# Patient Record
Sex: Male | Born: 1945 | Race: White | Hispanic: No | Marital: Married | Smoking: Never smoker
Health system: Southern US, Community
[De-identification: ages and names within clinical notes are randomized; demographics above are authoritative.]

## PROBLEM LIST (undated history)

## (undated) DIAGNOSIS — N400 Enlarged prostate without lower urinary tract symptoms: Secondary | ICD-10-CM

## (undated) DIAGNOSIS — I272 Pulmonary hypertension, unspecified: Secondary | ICD-10-CM

## (undated) DIAGNOSIS — N189 Chronic kidney disease, unspecified: Secondary | ICD-10-CM

## (undated) DIAGNOSIS — F32A Depression, unspecified: Secondary | ICD-10-CM

## (undated) DIAGNOSIS — F419 Anxiety disorder, unspecified: Secondary | ICD-10-CM

## (undated) DIAGNOSIS — K589 Irritable bowel syndrome without diarrhea: Secondary | ICD-10-CM

## (undated) DIAGNOSIS — Z87898 Personal history of other specified conditions: Secondary | ICD-10-CM

## (undated) DIAGNOSIS — I10 Essential (primary) hypertension: Secondary | ICD-10-CM

## (undated) DIAGNOSIS — M1990 Unspecified osteoarthritis, unspecified site: Secondary | ICD-10-CM

## (undated) DIAGNOSIS — I519 Heart disease, unspecified: Secondary | ICD-10-CM

## (undated) DIAGNOSIS — I4891 Unspecified atrial fibrillation: Secondary | ICD-10-CM

## (undated) DIAGNOSIS — E785 Hyperlipidemia, unspecified: Secondary | ICD-10-CM

## (undated) HISTORY — PX: PROSTATE BIOPSY: SHX241

## (undated) HISTORY — DX: Essential (primary) hypertension: I10

## (undated) HISTORY — DX: Pulmonary hypertension, unspecified: I27.20

## (undated) HISTORY — DX: Chronic kidney disease, unspecified: N18.9

## (undated) HISTORY — DX: Unspecified atrial fibrillation: I48.91

## (undated) HISTORY — DX: Anxiety disorder, unspecified: F41.9

## (undated) HISTORY — DX: Depression, unspecified: F32.A

## (undated) HISTORY — PX: OTHER SURGICAL HISTORY: SHX169

## (undated) HISTORY — DX: Heart disease, unspecified: I51.9

## (undated) HISTORY — DX: Hyperlipidemia, unspecified: E78.5

## (undated) HISTORY — PX: CHOLECYSTECTOMY: SHX55

## (undated) HISTORY — DX: Personal history of other specified conditions: Z87.898

## (undated) HISTORY — DX: Unspecified osteoarthritis, unspecified site: M19.90

## (undated) HISTORY — DX: Irritable bowel syndrome, unspecified: K58.9

## (undated) HISTORY — DX: Benign prostatic hyperplasia without lower urinary tract symptoms: N40.0

---

## 2005-02-26 ENCOUNTER — Emergency Department: Payer: Self-pay | Admitting: Emergency Medicine

## 2005-02-26 ENCOUNTER — Other Ambulatory Visit: Payer: Self-pay

## 2011-10-30 ENCOUNTER — Ambulatory Visit: Payer: Self-pay | Admitting: Otolaryngology

## 2013-02-11 ENCOUNTER — Ambulatory Visit: Payer: Self-pay

## 2013-02-11 LAB — DOT URINE DIP
GLUCOSE, UR: NEGATIVE mg/dL (ref 0–75)
PROTEIN: NEGATIVE
Specific Gravity: 1.005 (ref 1.003–1.030)

## 2013-07-09 IMAGING — RF DG BARIUM SWALLOW
1 series · 15 of 17 positions shown · non-contrast
Comparison: none

REASON FOR EXAM: dysphagia
COMMENTS:

PROCEDURE:     FL  - FL BARIUM SWALLOW  - October 30, 2011  [DATE]
RESULT:     Comparison: None.
TECHNIQUE: Standard double contrast barium swallow was performed with
monitoring by intermittent fluoroscopy.

[Series 1: run · 11 acquisitions, 15 frames shown]
[im 1/11]
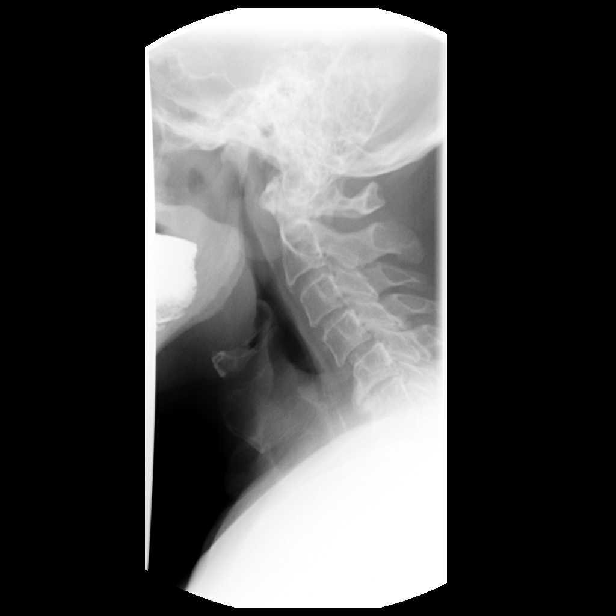
[im 1/11]
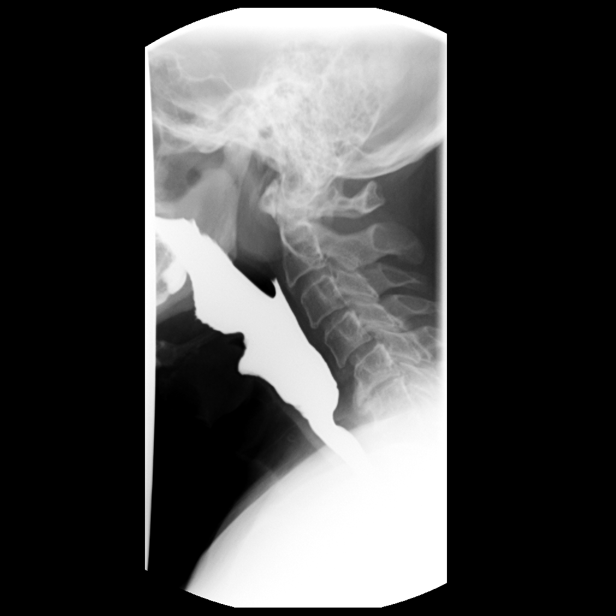
[im 1/11]
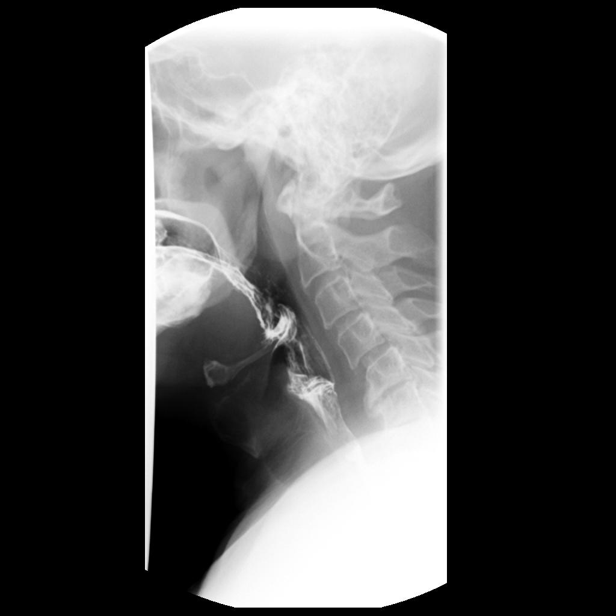
[im 1/11]
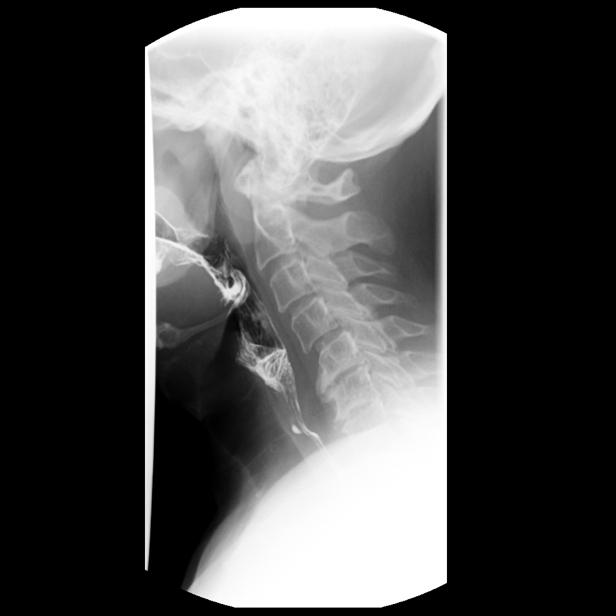
[im 2/11]
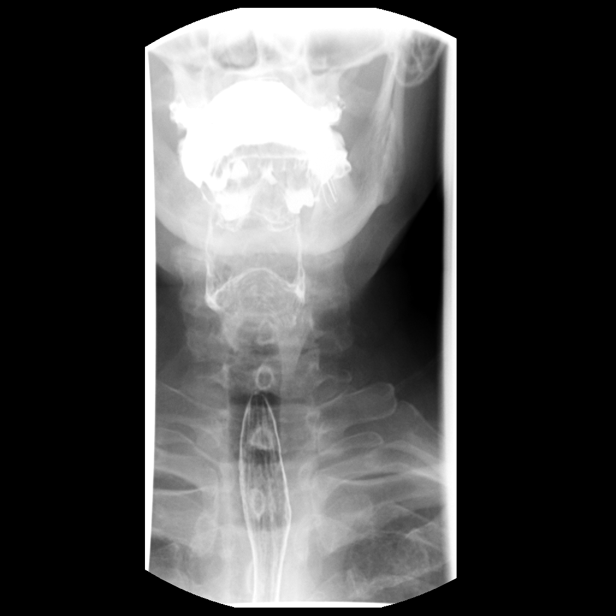
[im 2/11]
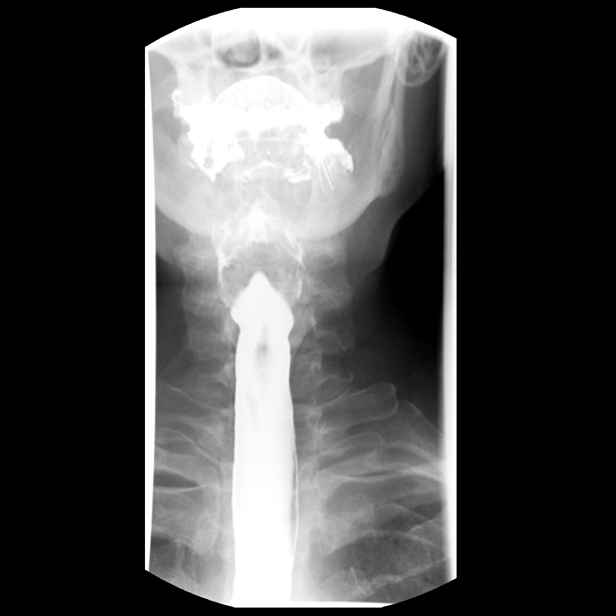
[im 2/11]
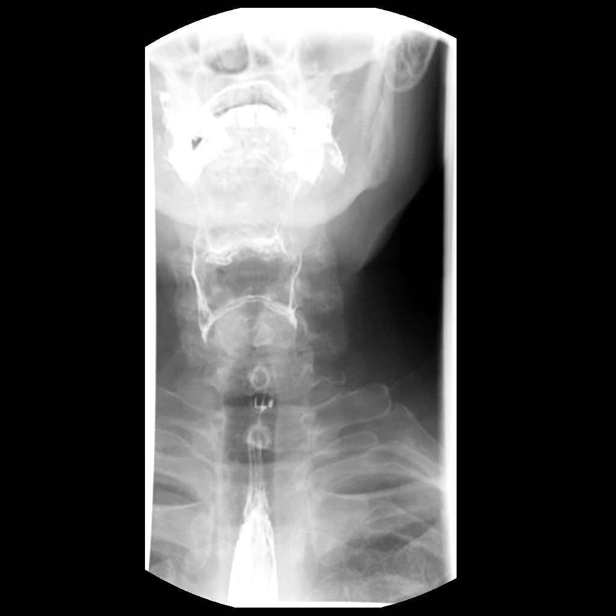
[im 3/11]
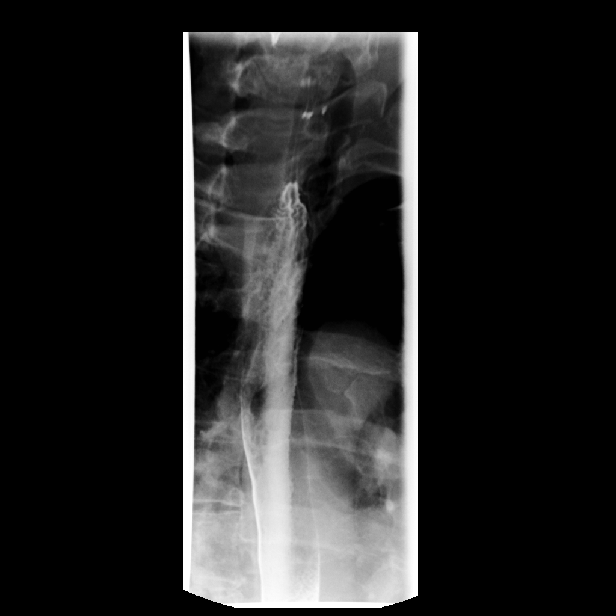
[im 4/11]
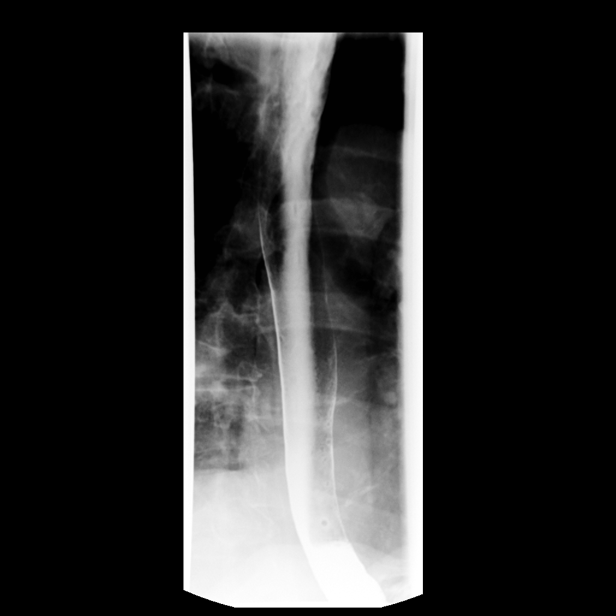
[im 5/11]
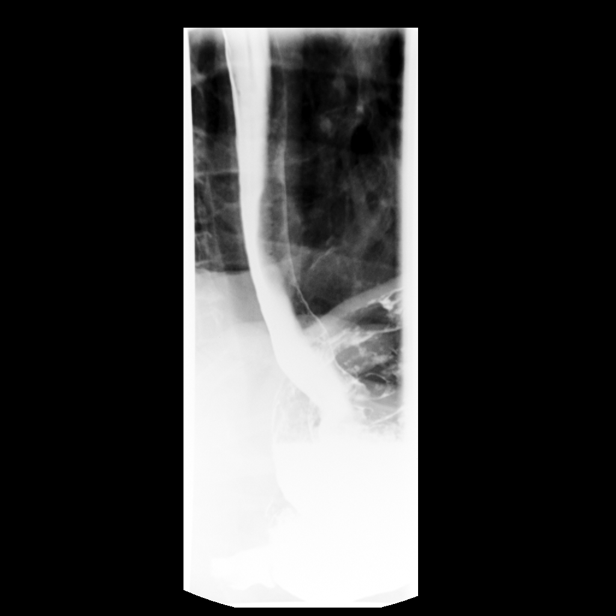
[im 6/11]
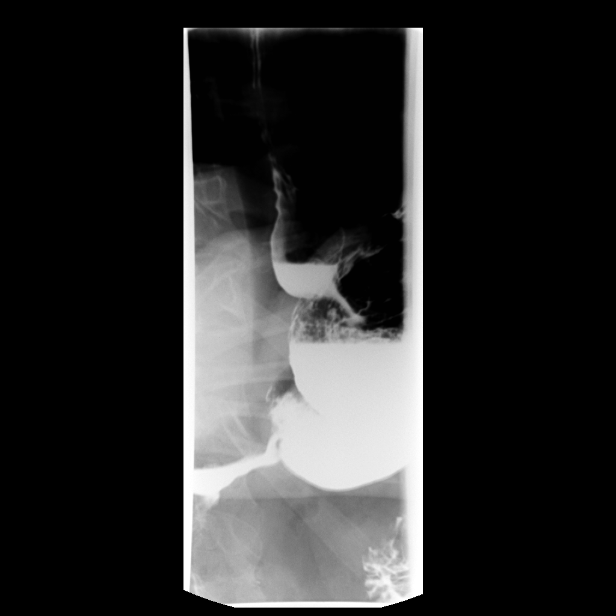
[im 8/11]
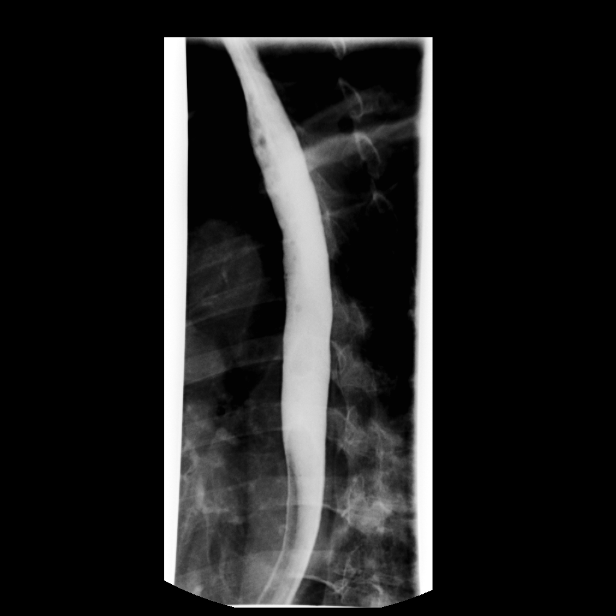
[im 9/11]
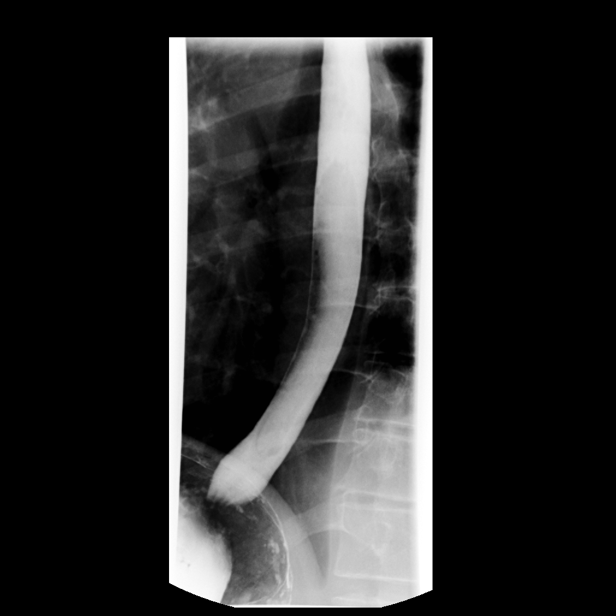
[im 10/11]
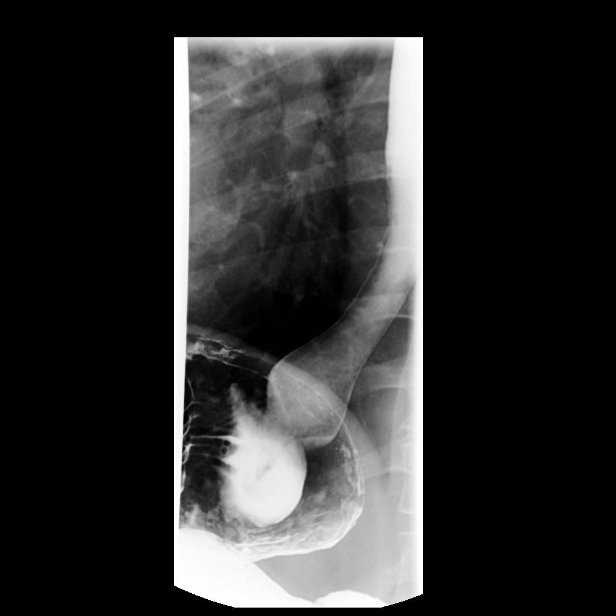
[im 11/11]
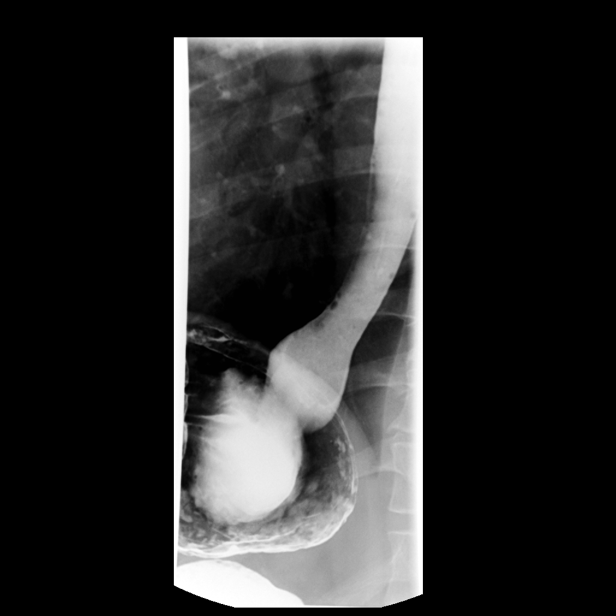

[15 of 17 positions shown; findings below may reference images not displayed]

FINDINGS: There is normal passage of barium contrast material through the of the
pharynx into the cervical esophagus. No penetration or aspiration. The
thoracic esophagus was normal in caliber. No gastroesophageal reflux seen
during the examination. There was a break in the primary peristaltic wave
with a few secondary contractions. No tertiary contractions were seen. The
patient swallowed a 13 mm barium tablet which passed freely into the stomach.

Degenerative disc disease is seen in the lower cervical spine.
IMPRESSION: Minimal esophageal dysmotility. Otherwise, unremarkable barium swallow.

## 2019-06-23 ENCOUNTER — Encounter: Payer: Medicare HMO | Attending: Internal Medicine | Admitting: *Deleted

## 2019-06-23 ENCOUNTER — Other Ambulatory Visit: Payer: Self-pay

## 2019-06-23 DIAGNOSIS — Z952 Presence of prosthetic heart valve: Secondary | ICD-10-CM | POA: Insufficient documentation

## 2019-06-23 DIAGNOSIS — Z9889 Other specified postprocedural states: Secondary | ICD-10-CM

## 2019-06-23 DIAGNOSIS — Z7901 Long term (current) use of anticoagulants: Secondary | ICD-10-CM | POA: Insufficient documentation

## 2019-06-23 DIAGNOSIS — Z79899 Other long term (current) drug therapy: Secondary | ICD-10-CM | POA: Insufficient documentation

## 2019-06-23 NOTE — Progress Notes (Signed)
Initial telephone encounter completed. Diagnosis can be found in [facility] 5/6. EP orientation scheduled for 5/17 1pm

## 2019-06-26 ENCOUNTER — Other Ambulatory Visit: Payer: Self-pay

## 2019-06-26 VITALS — Ht 70.0 in | Wt 188.1 lb

## 2019-06-26 DIAGNOSIS — Z952 Presence of prosthetic heart valve: Secondary | ICD-10-CM

## 2019-06-26 DIAGNOSIS — Z9889 Other specified postprocedural states: Secondary | ICD-10-CM

## 2019-06-26 DIAGNOSIS — Z7901 Long term (current) use of anticoagulants: Secondary | ICD-10-CM | POA: Diagnosis not present

## 2019-06-26 DIAGNOSIS — Z79899 Other long term (current) drug therapy: Secondary | ICD-10-CM | POA: Diagnosis not present

## 2019-06-26 NOTE — Progress Notes (Signed)
Cardiac Individual Treatment Plan  Patient Details  Name: Chase Boyd MRN: [id number] Date of Birth: [birth year] Referring Provider:     Cardiac Rehab from 06/26/2019 in [facility] Cardiac and Pulmonary Rehab  Referring Provider  Wang      Initial Encounter Date:    Cardiac Rehab from 06/26/2019 in [facility] Cardiac and Pulmonary Rehab  Date  06/26/19      Visit Diagnosis: S/P MVR (mitral valve replacement)  S/P TVR (tricuspid valve repair)  Patient's Home Medications on Admission:  Current Outpatient Medications:  .  amiodarone (PACERONE) 200 MG tablet, Take by mouth., Disp: , Rfl:  .  amLODipine (NORVASC) 5 MG tablet, Take by mouth., Disp: , Rfl:  .  apixaban (ELIQUIS) 5 MG TABS tablet, Take by mouth., Disp: , Rfl:  .  buPROPion (WELLBUTRIN XL) 150 MG 24 hr tablet, Take by mouth., Disp: , Rfl:  .  escitalopram (LEXAPRO) 20 MG tablet, Take by mouth., Disp: , Rfl:  .  finasteride (PROSCAR) 5 MG tablet, Take 1 tablet PO daily, Disp: , Rfl:  .  furosemide (LASIX) 20 MG tablet, Take by mouth., Disp: , Rfl:  .  Multiple Vitamin (MULTI-VITAMIN) tablet, Take by mouth., Disp: , Rfl:   Past Medical History: No past medical history on file.  Tobacco Use: Social History   Tobacco Use  Smoking Status Not on file    Labs: Recent Review Flowsheet Data    There is no flowsheet data to display.       Exercise Target Goals: Exercise Program Goal: Individual exercise prescription set using results from initial 6 min walk test and THRR while considering  patient's activity barriers and safety.   Exercise Prescription Goal: Initial exercise prescription builds to 30-45 minutes a day of aerobic activity, 2-3 days per week.  Home exercise guidelines will be given to patient during program as part of exercise prescription that the participant will acknowledge.   Education: Aerobic Exercise & Resistance Training: - Gives group verbal and written instruction on the various components of  exercise. Focuses on aerobic and resistive training programs and the benefits of this training and how to safely progress through these programs..   Education: Exercise & Equipment Safety: - Individual verbal instruction and demonstration of equipment use and safety with use of the equipment.   Cardiac Rehab from 06/26/2019 in [facility] Cardiac and Pulmonary Rehab  Date  06/26/19  Educator  AS  Instruction Review Code  1- Verbalizes Understanding      Education: Exercise Physiology & General Exercise Guidelines: - Group verbal and written instruction with models to review the exercise physiology of the cardiovascular system and associated critical values. Provides general exercise guidelines with specific guidelines to those with heart or lung disease.    Education: Flexibility, Balance, Mind/Body Relaxation: Provides group verbal/written instruction on the benefits of flexibility and balance training, including mind/body exercise modes such as yoga, pilates and tai chi.  Demonstration and skill practice provided.   Activity Barriers & Risk Stratification: Activity Barriers & Cardiac Risk Stratification - 06/23/19 1409      Activity Barriers & Cardiac Risk Stratification   Activity Barriers  Back Problems    Cardiac Risk Stratification  High       6 Minute Walk: 6 Minute Walk    Row Name 06/26/19 1441         6 Minute Walk   Phase  Initial     Distance  995 feet     Walk Time  6  minutes     # of Rest Breaks  0     MPH  1.88     METS  2.25     RPE  11     Perceived Dyspnea   1     VO2 Peak  7.87     Symptoms  No     Resting HR  95 bpm     Resting BP  100/64     Resting Oxygen Saturation   97 %     Exercise Oxygen Saturation  during 6 min walk  95 %     Max Ex. HR  107 bpm     Max Ex. BP  118/66     2 Minute Post BP  112/68        Oxygen Initial Assessment:   Oxygen Re-Evaluation:   Oxygen Discharge (Final Oxygen Re-Evaluation):   Initial Exercise  Prescription: Initial Exercise Prescription - 06/26/19 1500      Date of Initial Exercise RX and Referring Provider   Date  06/26/19    Referring Provider  Mina Marble      Treadmill   MPH  1.5    Grade  0.5    Minutes  15    METs  2.25      Recumbant Bike   Level  1    RPM  60    Minutes  15    METs  2.25      NuStep   Level  2    SPM  80    Minutes  15    METs  2.25      REL-XR   Level  2    Speed  50    Minutes  15    METs  2.25      Prescription Details   Frequency (times per week)  3    Duration  Progress to 30 minutes of continuous aerobic without signs/symptoms of physical distress      Intensity   THRR 40-80% of Max Heartrate  116-137    Ratings of Perceived Exertion  11-13    Perceived Dyspnea  0-4      Resistance Training   Training Prescription  Yes    Weight  3 lb    Reps  10-15       Perform Capillary Blood Glucose checks as needed.  Exercise Prescription Changes: Exercise Prescription Changes    Row Name 06/26/19 1500             Response to Exercise   Blood Pressure (Admit)  110/64       Blood Pressure (Exercise)  118/66       Blood Pressure (Exit)  112/68       Heart Rate (Admit)  95 bpm       Heart Rate (Exercise)  107 bpm       Heart Rate (Exit)  99 bpm       Oxygen Saturation (Admit)  97 %       Oxygen Saturation (Exercise)  95 %       Rating of Perceived Exertion (Exercise)  11       Perceived Dyspnea (Exercise)  1       Symptoms  none          Exercise Comments:   Exercise Goals and Review: Exercise Goals    Row Name 06/26/19 1601             Exercise Goals   Increase Physical  Activity  Yes       Intervention  Provide advice, education, support and counseling about physical activity/exercise needs.;Develop an individualized exercise prescription for aerobic and resistive training based on initial evaluation findings, risk stratification, comorbidities and participant's personal goals.       Expected Outcomes  Short  Term: Attend rehab on a regular basis to increase amount of physical activity.;Long Term: Add in home exercise to make exercise part of routine and to increase amount of physical activity.;Long Term: Exercising regularly at least 3-5 days a week.       Increase Strength and Stamina  Yes       Intervention  Provide advice, education, support and counseling about physical activity/exercise needs.;Develop an individualized exercise prescription for aerobic and resistive training based on initial evaluation findings, risk stratification, comorbidities and participant's personal goals.       Expected Outcomes  Short Term: Increase workloads from initial exercise prescription for resistance, speed, and METs.;Short Term: Perform resistance training exercises routinely during rehab and add in resistance training at home;Long Term: Improve cardiorespiratory fitness, muscular endurance and strength as measured by increased METs and functional capacity (6MWT)       Able to understand and use rate of perceived exertion (RPE) scale  Yes       Intervention  Provide education and explanation on how to use RPE scale       Expected Outcomes  Short Term: Able to use RPE daily in rehab to express subjective intensity level;Long Term:  Able to use RPE to guide intensity level when exercising independently       Able to understand and use Dyspnea scale  Yes       Intervention  Provide education and explanation on how to use Dyspnea scale       Expected Outcomes  Short Term: Able to use Dyspnea scale daily in rehab to express subjective sense of shortness of breath during exertion;Long Term: Able to use Dyspnea scale to guide intensity level when exercising independently       Knowledge and understanding of Target Heart Rate Range (THRR)  Yes       Intervention  Provide education and explanation of THRR including how the numbers were predicted and where they are located for reference       Expected Outcomes  Short Term: Able  to state/look up THRR;Short Term: Able to use daily as guideline for intensity in rehab;Long Term: Able to use THRR to govern intensity when exercising independently       Able to check pulse independently  Yes       Intervention  Provide education and demonstration on how to check pulse in carotid and radial arteries.;Review the importance of being able to check your own pulse for safety during independent exercise       Expected Outcomes  Short Term: Able to explain why pulse checking is important during independent exercise;Long Term: Able to check pulse independently and accurately       Understanding of Exercise Prescription  Yes       Intervention  Provide education, explanation, and written materials on patient's individual exercise prescription       Expected Outcomes  Short Term: Able to explain program exercise prescription;Long Term: Able to explain home exercise prescription to exercise independently          Exercise Goals Re-Evaluation : Exercise Goals Re-Evaluation    [place] Name 06/26/19 1600  Exercise Goal Re-Evaluation   Exercise Goals Review  Increase Physical Activity;Increase Strength and Stamina;Able to understand and use rate of perceived exertion (RPE) scale;Able to understand and use Dyspnea scale;Knowledge and understanding of Target Heart Rate Range (THRR);Able to check pulse independently;Understanding of Exercise Prescription          Discharge Exercise Prescription (Final Exercise Prescription Changes): Exercise Prescription Changes - 06/26/19 1500      Response to Exercise   Blood Pressure (Admit)  110/64    Blood Pressure (Exercise)  118/66    Blood Pressure (Exit)  112/68    Heart Rate (Admit)  95 bpm    Heart Rate (Exercise)  107 bpm    Heart Rate (Exit)  99 bpm    Oxygen Saturation (Admit)  97 %    Oxygen Saturation (Exercise)  95 %    Rating of Perceived Exertion (Exercise)  11    Perceived Dyspnea (Exercise)  1    Symptoms  none        Nutrition:  Target Goals: Understanding of nutrition guidelines, daily intake of sodium <1571m, cholesterol <2064m calories 30% from fat and 7% or less from saturated fats, daily to have 5 or more servings of fruits and vegetables.  Education: Controlling Sodium/Reading Food Labels -Group verbal and written material supporting the discussion of sodium use in heart healthy nutrition. Review and explanation with models, verbal and written materials for utilization of the food label.   Education: General Nutrition Guidelines/Fats and Fiber: -Group instruction provided by verbal, written material, models and posters to present the general guidelines for heart healthy nutrition. Gives an explanation and review of dietary fats and fiber.   Biometrics: Pre Biometrics - 06/26/19 1602      Pre Biometrics   Height  _0  (1.778 m)    Weight  188 lb 1.6 oz (85.3 kg)    BMI (Calculated)  26.99    Single Leg Stand  9.94 seconds        Nutrition Therapy Plan and Nutrition Goals:   Nutrition Assessments: Nutrition Assessments - 06/26/19 1607      MEDFICTS Scores   Pre Score  35       MEDIFICTS Score Key:          ?70 Need to make dietary changes          40-70 Heart Healthy Diet         ? 40 Therapeutic Level Cholesterol Diet  Nutrition Goals Re-Evaluation:   Nutrition Goals Discharge (Final Nutrition Goals Re-Evaluation):   Psychosocial: Target Goals: Acknowledge presence or absence of significant depression and/or stress, maximize coping skills, provide positive support system. Participant is able to verbalize types and ability to use techniques and skills needed for reducing stress and depression.   Education: Depression - Provides group verbal and written instruction on the correlation between heart/lung disease and depressed mood, treatment options, and the stigmas associated with seeking treatment.   Education: Sleep Hygiene -Provides group verbal and written  instruction about how sleep can affect your health.  Define sleep hygiene, discuss sleep cycles and impact of sleep habits. Review good sleep hygiene tips.     Education: Stress and Anxiety: - Provides group verbal and written instruction about the health risks of elevated stress and causes of high stress.  Discuss the correlation between heart/lung disease and anxiety and treatment options. Review healthy ways to manage with stress and anxiety.    Initial Review & Psychosocial Screening: Initial Psych Review & Screening -  06/23/19 1411      Initial Review   Current issues with  History of Depression;Current Psychotropic Meds;Current Depression      Family Dynamics   Good Support System?  Yes      Barriers   Psychosocial barriers to participate in program  There are no identifiable barriers or psychosocial needs.;The patient should benefit from training in stress management and relaxation.      Screening Interventions   Interventions  Encouraged to exercise;To provide support and resources with identified psychosocial needs;Provide feedback about the scores to participant    Expected Outcomes  Short Term goal: Utilizing psychosocial counselor, staff and physician to assist with identification of specific Stressors or current issues interfering with healing process. Setting desired goal for each stressor or current issue identified.;Long Term Goal: Stressors or current issues are controlled or eliminated.;Short Term goal: Identification and review with participant of any Quality of Life or Depression concerns found by scoring the questionnaire.;Long Term goal: The participant improves quality of Life and PHQ9 Scores as seen by post scores and/or verbalization of changes       Quality of Life Scores:  Quality of Life - 06/26/19 1604      Quality of Life   Select  Quality of Life      Quality of Life Scores   Health/Function Pre  17.93 %    Socioeconomic Pre  21.69 %     Psych/Spiritual Pre  24.71 %    Family Pre  27 %    GLOBAL Pre  21.28 %      Scores of 19 and below usually indicate a poorer quality of life in these areas.  A difference of  2-3 points is a clinically meaningful difference.  A difference of 2-3 points in the total score of the Quality of Life Index has been associated with significant improvement in overall quality of life, self-image, physical symptoms, and general health in studies assessing change in quality of life.  PHQ-9: Recent Review Flowsheet Data    Depression screen [facility] 2/9 06/26/2019   Decreased Interest 2   Down, Depressed, Hopeless 1   PHQ - 2 Score 3   Altered sleeping 3   Tired, decreased energy 2   Change in appetite 1   Feeling bad or failure about yourself  0   Trouble concentrating 1   Moving slowly or fidgety/restless 2   Suicidal thoughts 0   PHQ-9 Score 12   Difficult doing work/chores Somewhat difficult     Interpretation of Total Score  Total Score Depression Severity:  1-4 = Minimal depression, 5-9 = Mild depression, 10-14 = Moderate depression, 15-19 = Moderately severe depression, 20-27 = Severe depression   Psychosocial Evaluation and Intervention: Psychosocial Evaluation - 06/23/19 1413      Psychosocial Evaluation & Interventions   Comments  Mr. Standage has been on depression medication for "a while and think its genetic." He states he has been doing well given all the health issues lately. He still is waiting to get his cataract surgery (was supposed to get it but then valve surgery happened). 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 He hopes getting more active will help him feel better    Expected Outcomes  Short: attend [facility] for exercise and education. Long: develop positive self care habits       Psychosocial Re-Evaluation:   Psychosocial Discharge (Final Psychosocial Re-Evaluation):  Vocational Rehabilitation: Provide  vocational rehab assistance to qualifying candidates.   Vocational Rehab Evaluation & Intervention: Vocational Rehab - 06/23/19 1406      Initial Vocational Rehab Evaluation & Intervention   Assessment shows need for Vocational Rehabilitation  No       Education: Education Goals: Education classes will be provided on a variety of topics geared toward better understanding of heart health and risk factor modification. Participant will state understanding/return demonstration of topics presented as noted by education test scores.  Learning Barriers/Preferences: Learning Barriers/Preferences - 06/23/19 1411      Learning Barriers/Preferences   Learning Barriers  None    Learning Preferences  None       General Cardiac Education Topics:  AED/CPR: - Group verbal and written instruction with the use of models to demonstrate the basic use of the AED with the basic ABC's of resuscitation.   Anatomy & Physiology of the Heart: - Group verbal and written instruction and models provide basic cardiac anatomy and physiology, with the coronary electrical and arterial systems. Review of Valvular disease and Heart Failure   Cardiac Procedures: - Group verbal and written instruction to review commonly prescribed medications for heart disease. Reviews the medication, class of the drug, and side effects. Includes the steps to properly store meds and maintain the prescription regimen. (beta blockers and nitrates)   Cardiac Medications I: - Group verbal and written instruction to review commonly prescribed medications for heart disease. Reviews the medication, class of the drug, and side effects. Includes the steps to properly store meds and maintain the prescription regimen.   Cardiac Medications II: -Group verbal and written instruction to review commonly prescribed medications for heart disease. Reviews the medication, class of the drug, and side effects. (all other drug classes)    Go  Sex-Intimacy & Heart Disease, Get SMART - Goal Setting: - Group verbal and written instruction through game format to discuss heart disease and the return to sexual intimacy. Provides group verbal and written material to discuss and apply goal setting through the application of the S.M.A.R.T. Method.   Other Matters of the Heart: - Provides group verbal, written materials and models to describe Stable Angina and Peripheral Artery. Includes description of the disease process and treatment options available to the cardiac patient.   Infection Prevention: - Provides verbal and written material to individual with discussion of infection control including proper hand washing and proper equipment cleaning during exercise session.   Cardiac Rehab from 06/26/2019 in [facility] Cardiac and Pulmonary Rehab  Date  06/26/19  Educator  AS  Instruction Review Code  1- Verbalizes Understanding      Falls Prevention: - Provides verbal and written material to individual with discussion of falls prevention and safety.   Cardiac Rehab from 06/26/2019 in [facility] Cardiac and Pulmonary Rehab  Date  06/26/19  Educator  AS  Instruction Review Code  1- Verbalizes Understanding      Other: -Provides group and verbal instruction on various topics (see comments)   Knowledge Questionnaire Score: Knowledge Questionnaire Score - 06/26/19 1606      Knowledge Questionnaire Score   Pre Score  24/26       Core Components/Risk Factors/Patient Goals at Admission: Personal Goals and Risk Factors at Admission - 06/26/19 1603      Core Components/Risk Factors/Patient Goals on Admission    Weight Management  Yes    Intervention  Weight Management: Develop a combined nutrition and exercise program designed to reach desired caloric intake,  while maintaining appropriate intake of nutrient and fiber, sodium and fats, and appropriate energy expenditure required for the weight goal.;Weight Management: Provide education and  appropriate resources to help participant work on and attain dietary goals.    Admit Weight  188 lb 1.6 oz (85.3 kg)    Goal Weight: Short Term  185 lb (83.9 kg)    Goal Weight: Long Term  180 lb (81.6 kg)    Expected Outcomes  Short Term: Continue to assess and modify interventions until short term weight is achieved;Long Term: Adherence to nutrition and physical activity/exercise program aimed toward attainment of established weight goal    Intervention  Provide education on lifestyle modifcations including regular physical activity/exercise, weight management, moderate sodium restriction and increased consumption of fresh fruit, vegetables, and low fat dairy, alcohol moderation, and smoking cessation.;Monitor prescription use compliance.    Expected Outcomes  Short Term: Continued assessment and intervention until BP is < 140/96m HG in hypertensive participants. < 130/881mHG in hypertensive participants with diabetes, heart failure or chronic kidney disease.;Long Term: Maintenance of blood pressure at goal levels.       Education:Diabetes - Individual verbal and written instruction to review signs/symptoms of diabetes, desired ranges of glucose level fasting, after meals and with exercise. Acknowledge that pre and post exercise glucose checks will be done for 3 sessions at entry of program.   Education: Know Your Numbers and Risk Factors: -Group verbal and written instruction about important numbers in your health.  Discussion of what are risk factors and how they play a role in the disease process.  Review of Cholesterol, Blood Pressure, Diabetes, and BMI and the role they play in your overall health.   Core Components/Risk Factors/Patient Goals Review:    Core Components/Risk Factors/Patient Goals at Discharge (Final Review):    ITP Comments: ITP Comments    Row Name 06/23/19 1404           ITP Comments  Initial telephone encounter completed. Diagnosis can be found in [facility] 5/6.  EP orientation scheduled for 5/17 1pm          Comments: initial ITP

## 2019-06-26 NOTE — Patient Instructions (Signed)
Patient Instructions  Patient Details  Name: Chase Boyd MRN: [id number] Date of Birth: Jul 21, 1945 Referring Provider:  Derinda Sis, MD  Below are your personal goals for exercise, nutrition, and risk factors. Our goal is to help you stay on track towards obtaining and maintaining these goals. We will be discussing your progress on these goals with you throughout the program.  Initial Exercise Prescription: Initial Exercise Prescription - 06/26/19 1500      Date of Initial Exercise RX and Referring Provider   Date  06/26/19    Referring Provider  Mina Marble      Treadmill   MPH  1.5    Grade  0.5    Minutes  15    METs  2.25      Recumbant Bike   Level  1    RPM  60    Minutes  15    METs  2.25      NuStep   Level  2    SPM  80    Minutes  15    METs  2.25      REL-XR   Level  2    Speed  50    Minutes  15    METs  2.25      Prescription Details   Frequency (times per week)  3    Duration  Progress to 30 minutes of continuous aerobic without signs/symptoms of physical distress      Intensity   THRR 40-80% of Max Heartrate  116-137    Ratings of Perceived Exertion  11-13    Perceived Dyspnea  0-4      Resistance Training   Training Prescription  Yes    Weight  3 lb    Reps  10-15       Exercise Goals: Frequency: Be able to perform aerobic exercise two to three times per week in program working toward 2-5 days per week of home exercise.  Intensity: Work with a perceived exertion of 11 (fairly light) - 15 (hard) while following your exercise prescription.  We will make changes to your prescription with you as you progress through the program.   Duration: Be able to do 30 to 45 minutes of continuous aerobic exercise in addition to a 5 minute warm-up and a 5 minute cool-down routine.   Nutrition Goals: Your personal nutrition goals will be established when you do your nutrition analysis with the dietician.  The following are general nutrition guidelines  to follow: Cholesterol < 200mg /day Sodium < 1500mg /day Fiber: Men over 50 yrs - 30 grams per day  Personal Goals: Personal Goals and Risk Factors at Admission - 06/26/19 1603      Core Components/Risk Factors/Patient Goals on Admission    Weight Management  Yes    Intervention  Weight Management: Develop a combined nutrition and exercise program designed to reach desired caloric intake, while maintaining appropriate intake of nutrient and fiber, sodium and fats, and appropriate energy expenditure required for the weight goal.;Weight Management: Provide education and appropriate resources to help participant work on and attain dietary goals.    Admit Weight  188 lb 1.6 oz (85.3 kg)    Goal Weight: Short Term  185 lb (83.9 kg)    Goal Weight: Long Term  180 lb (81.6 kg)    Expected Outcomes  Short Term: Continue to assess and modify interventions until short term weight is achieved;Long Term: Adherence to nutrition and physical activity/exercise program aimed toward attainment of established  weight goal    Intervention  Provide education on lifestyle modifcations including regular physical activity/exercise, weight management, moderate sodium restriction and increased consumption of fresh fruit, vegetables, and low fat dairy, alcohol moderation, and smoking cessation.;Monitor prescription use compliance.    Expected Outcomes  Short Term: Continued assessment and intervention until BP is < 140/65mm HG in hypertensive participants. < 130/5mm HG in hypertensive participants with diabetes, heart failure or chronic kidney disease.;Long Term: Maintenance of blood pressure at goal levels.       Tobacco Use Initial Evaluation: Social History   Tobacco Use  Smoking Status Not on file    Exercise Goals and Review: Exercise Goals    Row Name 06/26/19 1601             Exercise Goals   Increase Physical Activity  Yes       Intervention  Provide advice, education, support and counseling about  physical activity/exercise needs.;Develop an individualized exercise prescription for aerobic and resistive training based on initial evaluation findings, risk stratification, comorbidities and participant's personal goals.       Expected Outcomes  Short Term: Attend rehab on a regular basis to increase amount of physical activity.;Long Term: Add in home exercise to make exercise part of routine and to increase amount of physical activity.;Long Term: Exercising regularly at least 3-5 days a week.       Increase Strength and Stamina  Yes       Intervention  Provide advice, education, support and counseling about physical activity/exercise needs.;Develop an individualized exercise prescription for aerobic and resistive training based on initial evaluation findings, risk stratification, comorbidities and participant's personal goals.       Expected Outcomes  Short Term: Increase workloads from initial exercise prescription for resistance, speed, and METs.;Short Term: Perform resistance training exercises routinely during rehab and add in resistance training at home;Long Term: Improve cardiorespiratory fitness, muscular endurance and strength as measured by increased METs and functional capacity ( )       Able to understand and use rate of perceived exertion (RPE) scale  Yes       Intervention  Provide education and explanation on how to use RPE scale       Expected Outcomes  Short Term: Able to use RPE daily in rehab to express subjective intensity level;Long Term:  Able to use RPE to guide intensity level when exercising independently       Able to understand and use Dyspnea scale  Yes       Intervention  Provide education and explanation on how to use Dyspnea scale       Expected Outcomes  Short Term: Able to use Dyspnea scale daily in rehab to express subjective sense of shortness of breath during exertion;Long Term: Able to use Dyspnea scale to guide intensity level when exercising independently        Knowledge and understanding of Target Heart Rate Range (THRR)  Yes       Intervention  Provide education and explanation of THRR including how the numbers were predicted and where they are located for reference       Expected Outcomes  Short Term: Able to state/look up THRR;Short Term: Able to use daily as guideline for intensity in rehab;Long Term: Able to use THRR to govern intensity when exercising independently       Able to check pulse independently  Yes       Intervention  Provide education and demonstration on how to check pulse in carotid  and radial arteries.;Review the importance of being able to check your own pulse for safety during independent exercise       Expected Outcomes  Short Term: Able to explain why pulse checking is important during independent exercise;Long Term: Able to check pulse independently and accurately       Understanding of Exercise Prescription  Yes       Intervention  Provide education, explanation, and written materials on patient's individual exercise prescription       Expected Outcomes  Short Term: Able to explain program exercise prescription;Long Term: Able to explain home exercise prescription to exercise independently          Copy of goals given to participant.

## 2019-06-28 ENCOUNTER — Encounter: Payer: Self-pay | Admitting: *Deleted

## 2019-06-28 ENCOUNTER — Other Ambulatory Visit: Payer: Self-pay

## 2019-06-28 ENCOUNTER — Encounter: Payer: Medicare HMO | Admitting: *Deleted

## 2019-06-28 DIAGNOSIS — Z952 Presence of prosthetic heart valve: Secondary | ICD-10-CM

## 2019-06-28 DIAGNOSIS — Z9889 Other specified postprocedural states: Secondary | ICD-10-CM

## 2019-06-28 NOTE — Progress Notes (Signed)
Cardiac Individual Treatment Plan  Patient Details  Name: Chase BUA MRN: [id number] Date of Birth: [birth year] Referring Provider:     Cardiac Rehab from 06/26/2019 in [facility] Cardiac and Pulmonary Rehab  Referring Provider  Wang      Initial Encounter Date:    Cardiac Rehab from 06/26/2019 in [facility] Cardiac and Pulmonary Rehab  Date  06/26/19      Visit Diagnosis: S/P MVR (mitral valve replacement)  S/P TVR (tricuspid valve repair)  Patient's Home Medications on Admission:  Current Outpatient Medications:  .  amiodarone (PACERONE) 200 MG tablet, Take by mouth., Disp: , Rfl:  .  amLODipine (NORVASC) 5 MG tablet, Take by mouth., Disp: , Rfl:  .  apixaban (ELIQUIS) 5 MG TABS tablet, Take by mouth., Disp: , Rfl:  .  buPROPion (WELLBUTRIN XL) 150 MG 24 hr tablet, Take by mouth., Disp: , Rfl:  .  escitalopram (LEXAPRO) 20 MG tablet, Take by mouth., Disp: , Rfl:  .  finasteride (PROSCAR) 5 MG tablet, Take 1 tablet PO daily, Disp: , Rfl:  .  furosemide (LASIX) 20 MG tablet, Take by mouth., Disp: , Rfl:  .  Multiple Vitamin (MULTI-VITAMIN) tablet, Take by mouth., Disp: , Rfl:   Past Medical History: No past medical history on file.  Tobacco Use: Social History   Tobacco Use  Smoking Status Not on file    Labs: Recent Review Flowsheet Data    There is no flowsheet data to display.       Exercise Target Goals: Exercise Program Goal: Individual exercise prescription set using results from initial 6 min walk test and THRR while considering  patient's activity barriers and safety.   Exercise Prescription Goal: Initial exercise prescription builds to 30-45 minutes a day of aerobic activity, 2-3 days per week.  Home exercise guidelines will be given to patient during program as part of exercise prescription that the participant will acknowledge.   Education: Aerobic Exercise & Resistance Training: - Gives group verbal and written instruction on the various components of  exercise. Focuses on aerobic and resistive training programs and the benefits of this training and how to safely progress through these programs..   Education: Exercise & Equipment Safety: - Individual verbal instruction and demonstration of equipment use and safety with use of the equipment.   Cardiac Rehab from 06/26/2019 in [facility] Cardiac and Pulmonary Rehab  Date  06/26/19  Educator  AS  Instruction Review Code  1- Verbalizes Understanding      Education: Exercise Physiology & General Exercise Guidelines: - Group verbal and written instruction with models to review the exercise physiology of the cardiovascular system and associated critical values. Provides general exercise guidelines with specific guidelines to those with heart or lung disease.    Education: Flexibility, Balance, Mind/Body Relaxation: Provides group verbal/written instruction on the benefits of flexibility and balance training, including mind/body exercise modes such as yoga, pilates and tai chi.  Demonstration and skill practice provided.   Activity Barriers & Risk Stratification: Activity Barriers & Cardiac Risk Stratification - 06/23/19 1409      Activity Barriers & Cardiac Risk Stratification   Activity Barriers  Back Problems    Cardiac Risk Stratification  High       6 Minute Walk: 6 Minute Walk    Row Name 06/26/19 1441         6 Minute Walk   Phase  Initial     Distance  995 feet     Walk Time  6  minutes     # of Rest Breaks  0     MPH  1.88     METS  2.25     RPE  11     Perceived Dyspnea   1     VO2 Peak  7.87     Symptoms  No     Resting HR  95 bpm     Resting BP  100/64     Resting Oxygen Saturation   97 %     Exercise Oxygen Saturation  during 6 min walk  95 %     Max Ex. HR  107 bpm     Max Ex. BP  118/66     2 Minute Post BP  112/68        Oxygen Initial Assessment:   Oxygen Re-Evaluation:   Oxygen Discharge (Final Oxygen Re-Evaluation):   Initial Exercise  Prescription: Initial Exercise Prescription - 06/26/19 1500      Date of Initial Exercise RX and Referring Provider   Date  06/26/19    Referring Provider  Mina Marble      Treadmill   MPH  1.5    Grade  0.5    Minutes  15    METs  2.25      Recumbant Bike   Level  1    RPM  60    Minutes  15    METs  2.25      NuStep   Level  2    SPM  80    Minutes  15    METs  2.25      REL-XR   Level  2    Speed  50    Minutes  15    METs  2.25      Prescription Details   Frequency (times per week)  3    Duration  Progress to 30 minutes of continuous aerobic without signs/symptoms of physical distress      Intensity   THRR 40-80% of Max Heartrate  116-137    Ratings of Perceived Exertion  11-13    Perceived Dyspnea  0-4      Resistance Training   Training Prescription  Yes    Weight  3 lb    Reps  10-15       Perform Capillary Blood Glucose checks as needed.  Exercise Prescription Changes: Exercise Prescription Changes    Row Name 06/26/19 1500             Response to Exercise   Blood Pressure (Admit)  110/64       Blood Pressure (Exercise)  118/66       Blood Pressure (Exit)  112/68       Heart Rate (Admit)  95 bpm       Heart Rate (Exercise)  107 bpm       Heart Rate (Exit)  99 bpm       Oxygen Saturation (Admit)  97 %       Oxygen Saturation (Exercise)  95 %       Rating of Perceived Exertion (Exercise)  11       Perceived Dyspnea (Exercise)  1       Symptoms  none          Exercise Comments:   Exercise Goals and Review: Exercise Goals    Row Name 06/26/19 1601             Exercise Goals   Increase Physical  Activity  Yes       Intervention  Provide advice, education, support and counseling about physical activity/exercise needs.;Develop an individualized exercise prescription for aerobic and resistive training based on initial evaluation findings, risk stratification, comorbidities and participant's personal goals.       Expected Outcomes  Short  Term: Attend rehab on a regular basis to increase amount of physical activity.;Long Term: Add in home exercise to make exercise part of routine and to increase amount of physical activity.;Long Term: Exercising regularly at least 3-5 days a week.       Increase Strength and Stamina  Yes       Intervention  Provide advice, education, support and counseling about physical activity/exercise needs.;Develop an individualized exercise prescription for aerobic and resistive training based on initial evaluation findings, risk stratification, comorbidities and participant's personal goals.       Expected Outcomes  Short Term: Increase workloads from initial exercise prescription for resistance, speed, and METs.;Short Term: Perform resistance training exercises routinely during rehab and add in resistance training at home;Long Term: Improve cardiorespiratory fitness, muscular endurance and strength as measured by increased METs and functional capacity (6MWT)       Able to understand and use rate of perceived exertion (RPE) scale  Yes       Intervention  Provide education and explanation on how to use RPE scale       Expected Outcomes  Short Term: Able to use RPE daily in rehab to express subjective intensity level;Long Term:  Able to use RPE to guide intensity level when exercising independently       Able to understand and use Dyspnea scale  Yes       Intervention  Provide education and explanation on how to use Dyspnea scale       Expected Outcomes  Short Term: Able to use Dyspnea scale daily in rehab to express subjective sense of shortness of breath during exertion;Long Term: Able to use Dyspnea scale to guide intensity level when exercising independently       Knowledge and understanding of Target Heart Rate Range (THRR)  Yes       Intervention  Provide education and explanation of THRR including how the numbers were predicted and where they are located for reference       Expected Outcomes  Short Term: Able  to state/look up THRR;Short Term: Able to use daily as guideline for intensity in rehab;Long Term: Able to use THRR to govern intensity when exercising independently       Able to check pulse independently  Yes       Intervention  Provide education and demonstration on how to check pulse in carotid and radial arteries.;Review the importance of being able to check your own pulse for safety during independent exercise       Expected Outcomes  Short Term: Able to explain why pulse checking is important during independent exercise;Long Term: Able to check pulse independently and accurately       Understanding of Exercise Prescription  Yes       Intervention  Provide education, explanation, and written materials on patient's individual exercise prescription       Expected Outcomes  Short Term: Able to explain program exercise prescription;Long Term: Able to explain home exercise prescription to exercise independently          Exercise Goals Re-Evaluation : Exercise Goals Re-Evaluation    [place] Name 06/26/19 1600  Exercise Goal Re-Evaluation   Exercise Goals Review  Increase Physical Activity;Increase Strength and Stamina;Able to understand and use rate of perceived exertion (RPE) scale;Able to understand and use Dyspnea scale;Knowledge and understanding of Target Heart Rate Range (THRR);Able to check pulse independently;Understanding of Exercise Prescription          Discharge Exercise Prescription (Final Exercise Prescription Changes): Exercise Prescription Changes - 06/26/19 1500      Response to Exercise   Blood Pressure (Admit)  110/64    Blood Pressure (Exercise)  118/66    Blood Pressure (Exit)  112/68    Heart Rate (Admit)  95 bpm    Heart Rate (Exercise)  107 bpm    Heart Rate (Exit)  99 bpm    Oxygen Saturation (Admit)  97 %    Oxygen Saturation (Exercise)  95 %    Rating of Perceived Exertion (Exercise)  11    Perceived Dyspnea (Exercise)  1    Symptoms  none        Nutrition:  Target Goals: Understanding of nutrition guidelines, daily intake of sodium <1571m, cholesterol <2064m calories 30% from fat and 7% or less from saturated fats, daily to have 5 or more servings of fruits and vegetables.  Education: Controlling Sodium/Reading Food Labels -Group verbal and written material supporting the discussion of sodium use in heart healthy nutrition. Review and explanation with models, verbal and written materials for utilization of the food label.   Education: General Nutrition Guidelines/Fats and Fiber: -Group instruction provided by verbal, written material, models and posters to present the general guidelines for heart healthy nutrition. Gives an explanation and review of dietary fats and fiber.   Biometrics: Pre Biometrics - 06/26/19 1602      Pre Biometrics   Height  _0  (1.778 m)    Weight  188 lb 1.6 oz (85.3 kg)    BMI (Calculated)  26.99    Single Leg Stand  9.94 seconds        Nutrition Therapy Plan and Nutrition Goals:   Nutrition Assessments: Nutrition Assessments - 06/26/19 1607      MEDFICTS Scores   Pre Score  35       MEDIFICTS Score Key:          ?70 Need to make dietary changes          40-70 Heart Healthy Diet         ? 40 Therapeutic Level Cholesterol Diet  Nutrition Goals Re-Evaluation:   Nutrition Goals Discharge (Final Nutrition Goals Re-Evaluation):   Psychosocial: Target Goals: Acknowledge presence or absence of significant depression and/or stress, maximize coping skills, provide positive support system. Participant is able to verbalize types and ability to use techniques and skills needed for reducing stress and depression.   Education: Depression - Provides group verbal and written instruction on the correlation between heart/lung disease and depressed mood, treatment options, and the stigmas associated with seeking treatment.   Education: Sleep Hygiene -Provides group verbal and written  instruction about how sleep can affect your health.  Define sleep hygiene, discuss sleep cycles and impact of sleep habits. Review good sleep hygiene tips.     Education: Stress and Anxiety: - Provides group verbal and written instruction about the health risks of elevated stress and causes of high stress.  Discuss the correlation between heart/lung disease and anxiety and treatment options. Review healthy ways to manage with stress and anxiety.    Initial Review & Psychosocial Screening: Initial Psych Review & Screening -  06/23/19 1411      Initial Review   Current issues with  History of Depression;Current Psychotropic Meds;Current Depression      Family Dynamics   Good Support System?  Yes      Barriers   Psychosocial barriers to participate in program  There are no identifiable barriers or psychosocial needs.;The patient should benefit from training in stress management and relaxation.      Screening Interventions   Interventions  Encouraged to exercise;To provide support and resources with identified psychosocial needs;Provide feedback about the scores to participant    Expected Outcomes  Short Term goal: Utilizing psychosocial counselor, staff and physician to assist with identification of specific Stressors or current issues interfering with healing process. Setting desired goal for each stressor or current issue identified.;Long Term Goal: Stressors or current issues are controlled or eliminated.;Short Term goal: Identification and review with participant of any Quality of Life or Depression concerns found by scoring the questionnaire.;Long Term goal: The participant improves quality of Life and PHQ9 Scores as seen by post scores and/or verbalization of changes       Quality of Life Scores:  Quality of Life - 06/26/19 1604      Quality of Life   Select  Quality of Life      Quality of Life Scores   Health/Function Pre  17.93 %    Socioeconomic Pre  21.69 %     Psych/Spiritual Pre  24.71 %    Family Pre  27 %    GLOBAL Pre  21.28 %      Scores of 19 and below usually indicate a poorer quality of life in these areas.  A difference of  2-3 points is a clinically meaningful difference.  A difference of 2-3 points in the total score of the Quality of Life Index has been associated with significant improvement in overall quality of life, self-image, physical symptoms, and general health in studies assessing change in quality of life.  PHQ-9: Recent Review Flowsheet Data    Depression screen [facility] 2/9 06/26/2019   Decreased Interest 2   Down, Depressed, Hopeless 1   PHQ - 2 Score 3   Altered sleeping 3   Tired, decreased energy 2   Change in appetite 1   Feeling bad or failure about yourself  0   Trouble concentrating 1   Moving slowly or fidgety/restless 2   Suicidal thoughts 0   PHQ-9 Score 12   Difficult doing work/chores Somewhat difficult     Interpretation of Total Score  Total Score Depression Severity:  1-4 = Minimal depression, 5-9 = Mild depression, 10-14 = Moderate depression, 15-19 = Moderately severe depression, 20-27 = Severe depression   Psychosocial Evaluation and Intervention: Psychosocial Evaluation - 06/23/19 1413      Psychosocial Evaluation & Interventions   Comments  Mr. Standage has been on depression medication for "a while and think its genetic." He states he has been doing well given all the health issues lately. He still is waiting to get his cataract surgery (was supposed to get it but then valve surgery happened). 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 He hopes getting more active will help him feel better    Expected Outcomes  Short: attend [facility] for exercise and education. Long: develop positive self care habits       Psychosocial Re-Evaluation:   Psychosocial Discharge (Final Psychosocial Re-Evaluation):  Vocational Rehabilitation: Provide  vocational rehab assistance to qualifying candidates.   Vocational Rehab Evaluation & Intervention: Vocational Rehab - 06/23/19 1406      Initial Vocational Rehab Evaluation & Intervention   Assessment shows need for Vocational Rehabilitation  No       Education: Education Goals: Education classes will be provided on a variety of topics geared toward better understanding of heart health and risk factor modification. Participant will state understanding/return demonstration of topics presented as noted by education test scores.  Learning Barriers/Preferences: Learning Barriers/Preferences - 06/23/19 1411      Learning Barriers/Preferences   Learning Barriers  None    Learning Preferences  None       General Cardiac Education Topics:  AED/CPR: - Group verbal and written instruction with the use of models to demonstrate the basic use of the AED with the basic ABC's of resuscitation.   Anatomy & Physiology of the Heart: - Group verbal and written instruction and models provide basic cardiac anatomy and physiology, with the coronary electrical and arterial systems. Review of Valvular disease and Heart Failure   Cardiac Procedures: - Group verbal and written instruction to review commonly prescribed medications for heart disease. Reviews the medication, class of the drug, and side effects. Includes the steps to properly store meds and maintain the prescription regimen. (beta blockers and nitrates)   Cardiac Medications I: - Group verbal and written instruction to review commonly prescribed medications for heart disease. Reviews the medication, class of the drug, and side effects. Includes the steps to properly store meds and maintain the prescription regimen.   Cardiac Medications II: -Group verbal and written instruction to review commonly prescribed medications for heart disease. Reviews the medication, class of the drug, and side effects. (all other drug classes)    Go  Sex-Intimacy & Heart Disease, Get SMART - Goal Setting: - Group verbal and written instruction through game format to discuss heart disease and the return to sexual intimacy. Provides group verbal and written material to discuss and apply goal setting through the application of the S.M.A.R.T. Method.   Other Matters of the Heart: - Provides group verbal, written materials and models to describe Stable Angina and Peripheral Artery. Includes description of the disease process and treatment options available to the cardiac patient.   Infection Prevention: - Provides verbal and written material to individual with discussion of infection control including proper hand washing and proper equipment cleaning during exercise session.   Cardiac Rehab from 06/26/2019 in [facility] Cardiac and Pulmonary Rehab  Date  06/26/19  Educator  AS  Instruction Review Code  1- Verbalizes Understanding      Falls Prevention: - Provides verbal and written material to individual with discussion of falls prevention and safety.   Cardiac Rehab from 06/26/2019 in [facility] Cardiac and Pulmonary Rehab  Date  06/26/19  Educator  AS  Instruction Review Code  1- Verbalizes Understanding      Other: -Provides group and verbal instruction on various topics (see comments)   Knowledge Questionnaire Score: Knowledge Questionnaire Score - 06/26/19 1606      Knowledge Questionnaire Score   Pre Score  24/26       Core Components/Risk Factors/Patient Goals at Admission: Personal Goals and Risk Factors at Admission - 06/26/19 1603      Core Components/Risk Factors/Patient Goals on Admission    Weight Management  Yes    Intervention  Weight Management: Develop a combined nutrition and exercise program designed to reach desired caloric intake,  while maintaining appropriate intake of nutrient and fiber, sodium and fats, and appropriate energy expenditure required for the weight goal.;Weight Management: Provide education and  appropriate resources to help participant work on and attain dietary goals.    Admit Weight  188 lb 1.6 oz (85.3 kg)    Goal Weight: Short Term  185 lb (83.9 kg)    Goal Weight: Long Term  180 lb (81.6 kg)    Expected Outcomes  Short Term: Continue to assess and modify interventions until short term weight is achieved;Long Term: Adherence to nutrition and physical activity/exercise program aimed toward attainment of established weight goal    Intervention  Provide education on lifestyle modifcations including regular physical activity/exercise, weight management, moderate sodium restriction and increased consumption of fresh fruit, vegetables, and low fat dairy, alcohol moderation, and smoking cessation.;Monitor prescription use compliance.    Expected Outcomes  Short Term: Continued assessment and intervention until BP is < 140/23m HG in hypertensive participants. < 130/864mHG in hypertensive participants with diabetes, heart failure or chronic kidney disease.;Long Term: Maintenance of blood pressure at goal levels.       Education:Diabetes - Individual verbal and written instruction to review signs/symptoms of diabetes, desired ranges of glucose level fasting, after meals and with exercise. Acknowledge that pre and post exercise glucose checks will be done for 3 sessions at entry of program.   Education: Know Your Numbers and Risk Factors: -Group verbal and written instruction about important numbers in your health.  Discussion of what are risk factors and how they play a role in the disease process.  Review of Cholesterol, Blood Pressure, Diabetes, and BMI and the role they play in your overall health.   Core Components/Risk Factors/Patient Goals Review:    Core Components/Risk Factors/Patient Goals at Discharge (Final Review):    ITP Comments: ITP Comments    Row Name 06/23/19 1404 06/26/19 1612 06/28/19 0843       ITP Comments  Initial telephone encounter completed. Diagnosis can  be found in [facility] 5/6. EP orientation scheduled for 5/17 1pm  Completed 6MWT and gym orientation.  Initial ITP created and sent for review to Dr. MaEmily FilbertMedical Director.  30 Day review completed. Medical Director review done, changes made as directed,and approval shown by signature of [employer] researcher       Comments:

## 2019-06-28 NOTE — Progress Notes (Signed)
Daily Session Note  Patient Details  Name: KIP CROPP MRN: [id number] Date of Birth: 1945/06/06 Referring Provider:     Cardiac Rehab from 06/26/2019 in [facility] Cardiac and Pulmonary Rehab  Referring Provider  Mina Marble      Encounter Date: 06/28/2019  Check In: Session Check In - 06/28/19 1508      Check-In   Supervising physician immediately available to respond to emergencies  See telemetry face sheet for immediately available ER MD    Location  [facility]-Cardiac & Pulmonary Rehab    Staff Present  Renita Papa, RN Vickki Hearing, BA, ACSM CEP, Exercise Physiologist;Melissa Caiola RDN, LDN    Virtual Visit  No    Medication changes reported      No    Fall or balance concerns reported     No    Warm-up and Cool-down  Performed on first and last piece of equipment    Resistance Training Performed  Yes    VAD Patient?  No    PAD/SET Patient?  No      Pain Assessment   Currently in Pain?  No/denies          Social History   Tobacco Use  Smoking Status Not on file    Goals Met:  Independence with exercise equipment Exercise tolerated well No report of cardiac concerns or symptoms Strength training completed today  Goals Unmet:  Not Applicable  Comments: First full day of exercise!  Patient was oriented to gym and equipment including functions, settings, policies, and procedures.  Patient's individual exercise prescription and treatment plan were reviewed.  All starting workloads were established based on the results of the 6 minute walk test done at initial orientation visit.  The plan for exercise progression was also introduced and progression will be customized based on patient's performance and goals.    Dr. Emily Filbert is Medical Director for [facility] and [facility].

## 2019-07-05 ENCOUNTER — Encounter: Payer: Medicare HMO | Admitting: *Deleted

## 2019-07-05 ENCOUNTER — Other Ambulatory Visit: Payer: Self-pay

## 2019-07-05 DIAGNOSIS — Z952 Presence of prosthetic heart valve: Secondary | ICD-10-CM

## 2019-07-05 DIAGNOSIS — Z9889 Other specified postprocedural states: Secondary | ICD-10-CM

## 2019-07-05 NOTE — Progress Notes (Signed)
Daily Session Note  Patient Details  Name: Chase Boyd MRN: [id number] Date of Birth: 1945/07/13 Referring Provider:     Cardiac Rehab from 06/26/2019 in [facility] Cardiac and Pulmonary Rehab  Referring Provider  Mina Marble      Encounter Date: 07/05/2019  Check In: Session Check In - 07/05/19 1513      Check-In   Supervising physician immediately available to respond to emergencies  See telemetry face sheet for immediately available ER MD    Location  [facility]-Cardiac & Pulmonary Rehab    Staff Present  Renita Papa, RN BSN;Melissa Caiola RDN, Rowe Pavy, BA, ACSM CEP, Exercise Physiologist    Virtual Visit  No    Medication changes reported      No    Fall or balance concerns reported     No    Warm-up and Cool-down  Performed on first and last piece of equipment    Resistance Training Performed  Yes    VAD Patient?  No    PAD/SET Patient?  No      Pain Assessment   Currently in Pain?  No/denies          Social History   Tobacco Use  Smoking Status Not on file    Goals Met:  Independence with exercise equipment Exercise tolerated well No report of cardiac concerns or symptoms Strength training completed today  Goals Unmet:  Not Applicable  Comments: Pt able to follow exercise prescription today without complaint.  Will continue to monitor for progression.    Dr. Emily Filbert is Medical Director for [place] and [facility].

## 2019-07-06 ENCOUNTER — Encounter: Payer: Medicare HMO | Admitting: *Deleted

## 2019-07-06 ENCOUNTER — Other Ambulatory Visit: Payer: Self-pay

## 2019-07-06 DIAGNOSIS — Z952 Presence of prosthetic heart valve: Secondary | ICD-10-CM

## 2019-07-06 NOTE — Progress Notes (Signed)
Daily Session Note  Patient Details  Name: Chase Boyd MRN: [id number] Date of Birth: [birth year] Referring Provider:     Cardiac Rehab from 06/26/2019 in [facility] Cardiac and Pulmonary Rehab  Referring Provider  Mina Marble      Encounter Date: 07/06/2019  Check In: Session Check In - 07/06/19 1517      Check-In   Supervising physician immediately available to respond to emergencies  See telemetry face sheet for immediately available ER MD    Location  [facility]-Cardiac & Pulmonary Rehab    Staff Present  Renita Papa, RN BSN;Joseph [address] [place], [place], [place], CCRP, CCET    Virtual Visit  No    Medication changes reported      No    Fall or balance concerns reported     No    Warm-up and Cool-down  Performed on first and last piece of equipment    Resistance Training Performed  Yes    VAD Patient?  No    PAD/SET Patient?  No      Pain Assessment   Currently in Pain?  No/denies          Social History   Tobacco Use  Smoking Status Not on file    Goals Met:  Independence with exercise equipment Exercise tolerated well No report of cardiac concerns or symptoms Strength training completed today  Goals Unmet:  Not Applicable  Comments: Pt able to follow exercise prescription today without complaint.  Will continue to monitor for progression.    Dr. Emily Filbert is Medical Director for [place] and [facility].

## 2019-07-12 ENCOUNTER — Other Ambulatory Visit: Payer: Self-pay

## 2019-07-12 ENCOUNTER — Encounter: Payer: Medicare HMO | Attending: Internal Medicine | Admitting: *Deleted

## 2019-07-12 DIAGNOSIS — Z7901 Long term (current) use of anticoagulants: Secondary | ICD-10-CM | POA: Diagnosis not present

## 2019-07-12 DIAGNOSIS — Z79899 Other long term (current) drug therapy: Secondary | ICD-10-CM | POA: Insufficient documentation

## 2019-07-12 DIAGNOSIS — Z952 Presence of prosthetic heart valve: Secondary | ICD-10-CM | POA: Diagnosis present

## 2019-07-12 DIAGNOSIS — Z9889 Other specified postprocedural states: Secondary | ICD-10-CM

## 2019-07-12 NOTE — Progress Notes (Signed)
Daily Session Note  Patient Details  Name: DEONDRA WIGGER MRN: [id number] Date of Birth: December 13, 1945 Referring Provider:     Cardiac Rehab from 06/26/2019 in [facility] Cardiac and Pulmonary Rehab  Referring Provider  Mina Marble      Encounter Date: 07/12/2019  Check In: Session Check In - 07/12/19 1501      Check-In   Supervising physician immediately available to respond to emergencies  See telemetry face sheet for immediately available ER MD    Location  [facility]-Cardiac & Pulmonary Rehab    Staff Present  Renita Papa, RN BSN;Melissa Caiola RDN, Rowe Pavy, BA, ACSM CEP, Exercise Physiologist    Virtual Visit  No    Medication changes reported      No    Fall or balance concerns reported     No    Warm-up and Cool-down  Performed on first and last piece of equipment    Resistance Training Performed  Yes    VAD Patient?  No    PAD/SET Patient?  No      Pain Assessment   Currently in Pain?  No/denies          Social History   Tobacco Use  Smoking Status Not on file    Goals Met:  Independence with exercise equipment Exercise tolerated well No report of cardiac concerns or symptoms Strength training completed today  Goals Unmet:  Not Applicable  Comments: Pt able to follow exercise prescription today without complaint.  Will continue to monitor for progression.    Dr. Emily Filbert is Medical Director for [place] and [facility].

## 2019-07-13 ENCOUNTER — Other Ambulatory Visit: Payer: Self-pay

## 2019-07-13 ENCOUNTER — Encounter: Payer: Medicare HMO | Admitting: *Deleted

## 2019-07-13 DIAGNOSIS — Z9889 Other specified postprocedural states: Secondary | ICD-10-CM

## 2019-07-13 DIAGNOSIS — Z952 Presence of prosthetic heart valve: Secondary | ICD-10-CM

## 2019-07-13 NOTE — Progress Notes (Signed)
Daily Session Note  Patient Details  Name: Chase Boyd MRN: [id number] Date of Birth: 1945-09-07 Referring Provider:     Cardiac Rehab from 06/26/2019 in [facility] Cardiac and Pulmonary Rehab  Referring Provider  Mina Marble      Encounter Date: 07/13/2019  Check In: Session Check In - 07/13/19 1514      Check-In   Supervising physician immediately available to respond to emergencies  See telemetry face sheet for immediately available ER MD    Location  [facility]-Cardiac & Pulmonary Rehab    Staff Present  Renita Papa, RN BSN;Laureen Owens Shark, BS, RRT, CPFT;Amanda Oletta Darter, BA, ACSM CEP, Exercise Physiologist    Virtual Visit  No    Medication changes reported      No    Fall or balance concerns reported     No    Warm-up and Cool-down  Performed on first and last piece of equipment    Resistance Training Performed  Yes    VAD Patient?  No    PAD/SET Patient?  No      Pain Assessment   Currently in Pain?  No/denies          Social History   Tobacco Use  Smoking Status Not on file    Goals Met:  Independence with exercise equipment Exercise tolerated well No report of cardiac concerns or symptoms Strength training completed today  Goals Unmet:  Not Applicable  Comments: Pt able to follow exercise prescription today without complaint.  Will continue to monitor for progression.    Dr. Emily Filbert is Medical Director for [place] and [facility].

## 2019-07-17 ENCOUNTER — Other Ambulatory Visit: Payer: Self-pay

## 2019-07-17 DIAGNOSIS — Z952 Presence of prosthetic heart valve: Secondary | ICD-10-CM

## 2019-07-17 DIAGNOSIS — Z9889 Other specified postprocedural states: Secondary | ICD-10-CM

## 2019-07-17 NOTE — Progress Notes (Signed)
Daily Session Note  Patient Details  Name: Chase Boyd MRN: [id number] Date of Birth: 1945-07-05 Referring Provider:     Cardiac Rehab from 06/26/2019 in [facility] Cardiac and Pulmonary Rehab  Referring Provider  Mina Marble      Encounter Date: 07/17/2019  Check In: Session Check In - 07/17/19 1518      Check-In   Supervising physician immediately available to respond to emergencies  See telemetry face sheet for immediately available ER MD    Location  [facility]-Cardiac & Pulmonary Rehab    Staff Present  Basilia Jumbo, RN, BSN;Joseph Hood RCP,RRT,BSRT;Kara Avella, [place] Exercise Physiologist;Jessica [place], [place], RCEP, CCRP, [place], BS, ACSM CEP, Exercise Physiologist;Melissa [place] RDN, LDN    Virtual Visit  No    Medication changes reported      No    Fall or balance concerns reported     No    Tobacco Cessation  No Change    Warm-up and Cool-down  Performed on first and last piece of equipment    Resistance Training Performed  Yes    VAD Patient?  No    PAD/SET Patient?  No      Pain Assessment   Currently in Pain?  No/denies          Social History   Tobacco Use  Smoking Status Not on file    Goals Met:  Independence with exercise equipment Exercise tolerated well No report of cardiac concerns or symptoms  Goals Unmet:  Not Applicable  Comments: Pt able to follow exercise prescription today without complaint.  Will continue to monitor for progression.    Dr. Emily Filbert is Medical Director for [place] and [facility].

## 2019-07-19 ENCOUNTER — Other Ambulatory Visit: Payer: Self-pay

## 2019-07-19 ENCOUNTER — Encounter: Payer: Medicare HMO | Admitting: *Deleted

## 2019-07-19 DIAGNOSIS — Z952 Presence of prosthetic heart valve: Secondary | ICD-10-CM

## 2019-07-19 DIAGNOSIS — Z9889 Other specified postprocedural states: Secondary | ICD-10-CM

## 2019-07-19 NOTE — Progress Notes (Signed)
Daily Session Note  Patient Details  Name: Chase Boyd MRN: [id number] Date of Birth: 1945/12/11 Referring Provider:     Cardiac Rehab from 06/26/2019 in [facility] Cardiac and Pulmonary Rehab  Referring Provider  Mina Marble      Encounter Date: 07/19/2019  Check In: Session Check In - 07/19/19 1520      Check-In   Supervising physician immediately available to respond to emergencies  See telemetry face sheet for immediately available ER MD    Location  [facility]-Cardiac & Pulmonary Rehab    Staff Present  Heath Lark, RN, BSN, CCRP;Laureen Owens Shark, BS, RRT, CPFT;Melissa Caiola RDN, LDN    Virtual Visit  No    Medication changes reported      No    Fall or balance concerns reported     No    Warm-up and Cool-down  Performed on first and last piece of equipment    Resistance Training Performed  Yes    VAD Patient?  No    PAD/SET Patient?  No      Pain Assessment   Currently in Pain?  No/denies          Social History   Tobacco Use  Smoking Status Not on file    Goals Met:  Independence with exercise equipment Exercise tolerated well No report of cardiac concerns or symptoms  Goals Unmet:  Not Applicable  Comments: Pt able to follow exercise prescription today without complaint.  Will continue to monitor for progression.    Dr. Emily Filbert is Medical Director for [place] and [facility].

## 2019-07-20 ENCOUNTER — Other Ambulatory Visit: Payer: Self-pay

## 2019-07-20 DIAGNOSIS — Z9889 Other specified postprocedural states: Secondary | ICD-10-CM

## 2019-07-20 DIAGNOSIS — Z952 Presence of prosthetic heart valve: Secondary | ICD-10-CM | POA: Diagnosis not present

## 2019-07-20 NOTE — Progress Notes (Signed)
Daily Session Note  Patient Details  Name: Chase Boyd MRN: [id number] Date of Birth: Mar 31, 1945 Referring Provider:     Cardiac Rehab from 06/26/2019 in [facility] Cardiac and Pulmonary Rehab  Referring Provider Mina Marble      Encounter Date: 07/20/2019  Check In:  Session Check In - 07/20/19 1504      Check-In   Supervising physician immediately available to respond to emergencies See telemetry face sheet for immediately available ER MD    Location [facility]-Cardiac & Pulmonary Rehab    Staff Present Vida Rigger RN, BSN;Jessica Luan [place], MA, RCEP, CCRP, Marylynn Pearson, MS Exercise Physiologist    Virtual Visit No    Medication changes reported     No    Fall or balance concerns reported    No    Warm-up and Cool-down Performed on first and last piece of equipment    Resistance Training Performed Yes    VAD Patient? No    PAD/SET Patient? No      Pain Assessment   Currently in Pain? No/denies              Social History   Tobacco Use  Smoking Status Not on file    Goals Met:  Proper associated with RPD/PD & O2 Sat Independence with exercise equipment Exercise tolerated well No report of cardiac concerns or symptoms Strength training completed today  Goals Unmet:  Not Applicable  Comments: Pt able to follow exercise prescription today without complaint.  Will continue to monitor for progression.   Dr. Emily Filbert is Medical Director for [place] and [facility].

## 2019-07-24 ENCOUNTER — Encounter: Payer: Medicare HMO | Admitting: *Deleted

## 2019-07-24 ENCOUNTER — Other Ambulatory Visit: Payer: Self-pay

## 2019-07-24 DIAGNOSIS — Z952 Presence of prosthetic heart valve: Secondary | ICD-10-CM | POA: Diagnosis not present

## 2019-07-24 DIAGNOSIS — Z9889 Other specified postprocedural states: Secondary | ICD-10-CM

## 2019-07-24 NOTE — Progress Notes (Signed)
Daily Session Note  Patient Details  Name: Chase Boyd MRN: [id number] Date of Birth: 10-18-1945 Referring Provider:     Cardiac Rehab from 06/26/2019 in [facility] Cardiac and Pulmonary Rehab  Referring Provider Mina Marble      Encounter Date: 07/24/2019  Check In:  Session Check In - 07/24/19 1501      Check-In   Supervising physician immediately available to respond to emergencies See telemetry face sheet for immediately available ER MD    Location [facility]-Cardiac & Pulmonary Rehab    Staff Present Renita Papa, RN BSN;Joseph [address] [place], [place], [place], CCRP, [place], [place], ACSM CEP, Exercise Physiologist    Virtual Visit No    Medication changes reported     No    Fall or balance concerns reported    No    Warm-up and Cool-down Performed on first and last piece of equipment    Resistance Training Performed Yes    VAD Patient? No    PAD/SET Patient? No      Pain Assessment   Currently in Pain? No/denies              Social History   Tobacco Use  Smoking Status Not on file    Goals Met:  Independence with exercise equipment Exercise tolerated well No report of cardiac concerns or symptoms Strength training completed today  Goals Unmet:  Not Applicable  Comments: Pt able to follow exercise prescription today without complaint.  Will continue to monitor for progression.    Dr. Emily Filbert is Medical Director for [place] and [facility].

## 2019-07-26 ENCOUNTER — Encounter: Payer: Self-pay | Admitting: *Deleted

## 2019-07-26 ENCOUNTER — Other Ambulatory Visit: Payer: Self-pay

## 2019-07-26 ENCOUNTER — Encounter: Payer: Medicare HMO | Admitting: *Deleted

## 2019-07-26 DIAGNOSIS — Z952 Presence of prosthetic heart valve: Secondary | ICD-10-CM

## 2019-07-26 DIAGNOSIS — Z9889 Other specified postprocedural states: Secondary | ICD-10-CM

## 2019-07-26 NOTE — Progress Notes (Signed)
Cardiac Individual Treatment Plan  Patient Details  Name: Chase Boyd MRN: [id number] Date of Birth: 1945-04-08 Referring Provider:     Cardiac Rehab from 06/26/2019 in [facility] Rehab  Referring Provider Wang      Initial Encounter Date:    Cardiac Rehab from 06/26/2019 in [facility] Cardiac and Pulmonary Rehab  Date 06/26/19      Visit Diagnosis: S/P MVR (mitral valve replacement)  S/P TVR (tricuspid valve repair)  Patient's Home Medications on Admission:  Current Outpatient Medications:  .  amiodarone (PACERONE) 200 MG tablet, Take by mouth., Disp: , Rfl:  .  amLODipine (NORVASC) 5 MG tablet, Take by mouth., Disp: , Rfl:  .  apixaban (ELIQUIS) 5 MG TABS tablet, Take by mouth., Disp: , Rfl:  .  buPROPion (WELLBUTRIN XL) 150 MG 24 hr tablet, Take by mouth., Disp: , Rfl:  .  escitalopram (LEXAPRO) 20 MG tablet, Take by mouth., Disp: , Rfl:  .  finasteride (PROSCAR) 5 MG tablet, Take 1 tablet PO daily, Disp: , Rfl:  .  furosemide (LASIX) 20 MG tablet, Take by mouth., Disp: , Rfl:  .  Multiple Vitamin (MULTI-VITAMIN) tablet, Take by mouth., Disp: , Rfl:   Past Medical History: No past medical history on file.  Tobacco Use: Social History   Tobacco Use  Smoking Status Not on file    Labs: Recent Review Flowsheet Data   There is no flowsheet data to display.      Exercise Target Goals: Exercise Program Goal: Individual exercise prescription set using results from initial 6 min walk test and THRR while considering  patient's activity barriers and safety.   Exercise Prescription Goal: Initial exercise prescription builds to 30-45 minutes a day of aerobic activity, 2-3 days per week.  Home exercise guidelines will be given to patient during program as part of exercise prescription that the participant will acknowledge.   Education: Aerobic Exercise & Resistance Training: - Gives group verbal and written instruction on the various components of  exercise. Focuses on aerobic and resistive training programs and the benefits of this training and how to safely progress through these programs..   Education: Exercise & Equipment Safety: - Individual verbal instruction and demonstration of equipment use and safety with use of the equipment.   Cardiac Rehab from 06/26/2019 in [facility] Cardiac and Pulmonary Rehab  Date 06/26/19  Educator AS  Instruction Review Code 1- Verbalizes Understanding      Education: Exercise Physiology & General Exercise Guidelines: - Group verbal and written instruction with models to review the exercise physiology of the cardiovascular system and associated critical values. Provides general exercise guidelines with specific guidelines to those with heart or lung disease.    Education: Flexibility, Balance, Mind/Body Relaxation: Provides group verbal/written instruction on the benefits of flexibility and balance training, including mind/body exercise modes such as yoga, pilates and tai chi.  Demonstration and skill practice provided.   Activity Barriers & Risk Stratification:  Activity Barriers & Cardiac Risk Stratification - 06/23/19 1409      Activity Barriers & Cardiac Risk Stratification   Activity Barriers Back Problems    Cardiac Risk Stratification High           6 Minute Walk:  6 Minute Walk    Row Name 06/26/19 1441         6 Minute Walk   Phase Initial     Distance 995 feet     Walk Time 6 minutes     #  of Rest Breaks 0     MPH 1.88     METS 2.25     RPE 11     Perceived Dyspnea  1     VO2 Peak 7.87     Symptoms No     Resting HR 95 bpm     Resting BP 100/64     Resting Oxygen Saturation  97 %     Exercise Oxygen Saturation  during 6 min walk 95 %     Max Ex. HR 107 bpm     Max Ex. BP 118/66     2 Minute Post BP 112/68            Oxygen Initial Assessment:   Oxygen Re-Evaluation:   Oxygen Discharge (Final Oxygen Re-Evaluation):   Initial Exercise Prescription:   Initial Exercise Prescription - 06/26/19 1500      Date of Initial Exercise RX and Referring Provider   Date 06/26/19    Referring Provider Mina Marble      Treadmill   MPH 1.5    Grade 0.5    Minutes 15    METs 2.25      Recumbant Bike   Level 1    RPM 60    Minutes 15    METs 2.25      NuStep   Level 2    SPM 80    Minutes 15    METs 2.25      REL-XR   Level 2    Speed 50    Minutes 15    METs 2.25      Prescription Details   Frequency (times per week) 3    Duration Progress to 30 minutes of continuous aerobic without signs/symptoms of physical distress      Intensity   THRR 40-80% of Max Heartrate 116-137    Ratings of Perceived Exertion 11-13    Perceived Dyspnea 0-4      Resistance Training   Training Prescription Yes    Weight 3 lb    Reps 10-15           Perform Capillary Blood Glucose checks as needed.  Exercise Prescription Changes:  Exercise Prescription Changes    Row Name 06/26/19 1500 07/06/19 1400 07/19/19 0900         Response to Exercise   Blood Pressure (Admit) 110/64 108/70 118/62     Blood Pressure (Exercise) 118/66 118/56 132/70     Blood Pressure (Exit) 112/68 100/62 90/62     Heart Rate (Admit) 95 bpm 63 bpm 105 bpm     Heart Rate (Exercise) 107 bpm 135 bpm 136 bpm     Heart Rate (Exit) 99 bpm 115 bpm 117 bpm     Oxygen Saturation (Admit) 97 % -- --     Oxygen Saturation (Exercise) 95 % -- --     Rating of Perceived Exertion (Exercise) 11 16 15      Perceived Dyspnea (Exercise) 1 -- --     Symptoms none -- none     Comments -- second day --     Duration -- Continue with 30 min of aerobic exercise without signs/symptoms of physical distress. Continue with 30 min of aerobic exercise without signs/symptoms of physical distress.     Intensity -- THRR unchanged THRR unchanged       Progression   Progression -- Continue to progress workloads to maintain intensity without signs/symptoms of physical distress. Continue to progress  workloads to maintain intensity without signs/symptoms of  physical distress.     Average METs -- 2.5 4.41       Resistance Training   Training Prescription -- Yes Yes     Weight -- 3 lb 3 lb     Reps -- 10-15 10-15       Interval Training   Interval Training -- -- No       Treadmill   MPH -- 1.5 --     Grade -- 0.5 --     Minutes -- 15 --     METs -- 2.25 --       Recumbant Bike   Level -- -- 4     Minutes -- -- 15     METs -- -- 4.22       NuStep   Level -- -- 2     Minutes -- -- 15     METs -- -- 4.6       Elliptical   Level -- 1 --     Speed -- 2.5 --     Minutes -- 15 --     METs -- 2.7 --            Exercise Comments:   Exercise Goals and Review:  Exercise Goals    Row Name 06/26/19 1601             Exercise Goals   Increase Physical Activity Yes       Intervention Provide advice, education, support and counseling about physical activity/exercise needs.;Develop an individualized exercise prescription for aerobic and resistive training based on initial evaluation findings, risk stratification, comorbidities and participant's personal goals.       Expected Outcomes Short Term: Attend rehab on a regular basis to increase amount of physical activity.;Long Term: Add in home exercise to make exercise part of routine and to increase amount of physical activity.;Long Term: Exercising regularly at least 3-5 days a week.       Increase Strength and Stamina Yes       Intervention Provide advice, education, support and counseling about physical activity/exercise needs.;Develop an individualized exercise prescription for aerobic and resistive training based on initial evaluation findings, risk stratification, comorbidities and participant's personal goals.       Expected Outcomes Short Term: Increase workloads from initial exercise prescription for resistance, speed, and METs.;Short Term: Perform resistance training exercises routinely during rehab and add in resistance  training at home;Long Term: Improve cardiorespiratory fitness, muscular endurance and strength as measured by increased METs and functional capacity (6MWT)       Able to understand and use rate of perceived exertion (RPE) scale Yes       Intervention Provide education and explanation on how to use RPE scale       Expected Outcomes Short Term: Able to use RPE daily in rehab to express subjective intensity level;Long Term:  Able to use RPE to guide intensity level when exercising independently       Able to understand and use Dyspnea scale Yes       Intervention Provide education and explanation on how to use Dyspnea scale       Expected Outcomes Short Term: Able to use Dyspnea scale daily in rehab to express subjective sense of shortness of breath during exertion;Long Term: Able to use Dyspnea scale to guide intensity level when exercising independently       Knowledge and understanding of Target Heart Rate Range (THRR) Yes       Intervention Provide education and explanation  of THRR including how the numbers were predicted and where they are located for reference       Expected Outcomes Short Term: Able to state/look up THRR;Short Term: Able to use daily as guideline for intensity in rehab;Long Term: Able to use THRR to govern intensity when exercising independently       Able to check pulse independently Yes       Intervention Provide education and demonstration on how to check pulse in carotid and radial arteries.;Review the importance of being able to check your own pulse for safety during independent exercise       Expected Outcomes Short Term: Able to explain why pulse checking is important during independent exercise;Long Term: Able to check pulse independently and accurately       Understanding of Exercise Prescription Yes       Intervention Provide education, explanation, and written materials on patient's individual exercise prescription       Expected Outcomes Short Term: Able to explain  program exercise prescription;Long Term: Able to explain home exercise prescription to exercise independently              Exercise Goals Re-Evaluation :  Exercise Goals Re-Evaluation    Row Name 06/26/19 1600 06/28/19 1510 07/06/19 1457 07/19/19 0909 07/20/19 1505     Exercise Goal Re-Evaluation   Exercise Goals Review Increase Physical Activity;Increase Strength and Stamina;Able to understand and use rate of perceived exertion (RPE) scale;Able to understand and use Dyspnea scale;Knowledge and understanding of Target Heart Rate Range (THRR);Able to check pulse independently;Understanding of Exercise Prescription Increase Physical Activity;Able to understand and use rate of perceived exertion (RPE) scale;Knowledge and understanding of Target Heart Rate Range (THRR);Understanding of Exercise Prescription;Increase Strength and Stamina;Able to check pulse independently -- Increase Physical Activity;Increase Strength and Stamina;Understanding of Exercise Prescription Increase Strength and Stamina;Increase Physical Activity   Comments -- Reviewed RPE and dyspnea scales, THR and program prescription with pt today.  Pt voiced understanding and was given a copy of goals to take home. Pradeep is doing well in rehab.  He did the whole 15 min on elliptical his second session! Williard is doing well in rehab. He is up to 4.6 METs on the NuStep.  We will continue to monitor his progress. Caydence has a treadmill at home that he uses on his off days from rehab. He is going to increase his workloads at home as he progresses through the program. Kyro has yet to do home exercise with the EP.   Expected Outcomes -- Short: Use RPE daily to regulate intensity. Long: Follow program prescription in THR. Short:  attend consistently Long : improve overall MET level Short: Continue to increase workloads Long: Continue to improve stamina. Short: inform patient about home exercise and increase workloads. Long: increase workloads  for home exercise          Discharge Exercise Prescription (Final Exercise Prescription Changes):  Exercise Prescription Changes - 07/19/19 0900      Response to Exercise   Blood Pressure (Admit) 118/62    Blood Pressure (Exercise) 132/70    Blood Pressure (Exit) 90/62    Heart Rate (Admit) 105 bpm    Heart Rate (Exercise) 136 bpm    Heart Rate (Exit) 117 bpm    Rating of Perceived Exertion (Exercise) 15    Symptoms none    Duration Continue with 30 min of aerobic exercise without signs/symptoms of physical distress.    Intensity THRR unchanged      Progression  Progression Continue to progress workloads to maintain intensity without signs/symptoms of physical distress.    Average METs 4.41      Resistance Training   Training Prescription Yes    Weight 3 lb    Reps 10-15      Interval Training   Interval Training No      Recumbant Bike   Level 4    Minutes 15    METs 4.22      NuStep   Level 2    Minutes 15    METs 4.6           Nutrition:  Target Goals: Understanding of nutrition guidelines, daily intake of sodium <1559m, cholesterol <2068m calories 30% from fat and 7% or less from saturated fats, daily to have 5 or more servings of fruits and vegetables.  Education: Controlling Sodium/Reading Food Labels -Group verbal and written material supporting the discussion of sodium use in heart healthy nutrition. Review and explanation with models, verbal and written materials for utilization of the food label.   Education: General Nutrition Guidelines/Fats and Fiber: -Group instruction provided by verbal, written material, models and posters to present the general guidelines for heart healthy nutrition. Gives an explanation and review of dietary fats and fiber.   Biometrics:  Pre Biometrics - 06/26/19 1602      Pre Biometrics   Height 5' 10"  (1.778 m)    Weight 188 lb 1.6 oz (85.3 kg)    BMI (Calculated) 26.99    Single Leg Stand 9.94 seconds             Nutrition Therapy Plan and Nutrition Goals:  Nutrition Therapy & Goals - 07/24/19 1529      Nutrition Therapy   Diet heart healthy, low Na    Protein (specify units) 70-75g    Fiber 30 grams    Whole Grain Foods 3 servings    Saturated Fats 12 max. grams    Fruits and Vegetables 5 servings/day    Sodium 1.5 grams      Personal Nutrition Goals   Nutrition Goal ST: add salads after dinner LT: strength back so he can work 5/10 now    Comments depends on what he wants B: cereal (cheerios and raisin bran and oat clusters - whole milk) or breakfast bar -doesn't eat much in the morning- in order to take medication with orange juice. D: sandwiches, fast food and pizza- doesn't eat a whole lot of vegetables. salad and grilled chicken at [employer]. Eats a lot of chicken. Pt would like to eat more salads after dinner from going out. Discussed heart healthy eating.      Intervention Plan   Intervention Prescribe, educate and counsel regarding individualized specific dietary modifications aiming towards targeted core components such as weight, hypertension, lipid management, diabetes, heart failure and other comorbidities.;Nutrition handout(s) given to patient.    Expected Outcomes Short Term Goal: Understand basic principles of dietary content, such as calories, fat, sodium, cholesterol and nutrients.;Short Term Goal: A plan has been developed with personal nutrition goals set during dietitian appointment.;Long Term Goal: Adherence to prescribed nutrition plan.           Nutrition Assessments:  Nutrition Assessments - 06/26/19 1607      MEDFICTS Scores   Pre Score 35           MEDIFICTS Score Key:          ?70 Need to make dietary changes  40-70 Heart Healthy Diet         ? 40 Therapeutic Level Cholesterol Diet  Nutrition Goals Re-Evaluation:   Nutrition Goals Discharge (Final Nutrition Goals Re-Evaluation):   Psychosocial: Target Goals: Acknowledge presence  or absence of significant depression and/or stress, maximize coping skills, provide positive support system. Participant is able to verbalize types and ability to use techniques and skills needed for reducing stress and depression.   Education: Depression - Provides group verbal and written instruction on the correlation between heart/lung disease and depressed mood, treatment options, and the stigmas associated with seeking treatment.   Education: Sleep Hygiene -Provides group verbal and written instruction about how sleep can affect your health.  Define sleep hygiene, discuss sleep cycles and impact of sleep habits. Review good sleep hygiene tips.     Education: Stress and Anxiety: - Provides group verbal and written instruction about the health risks of elevated stress and causes of high stress.  Discuss the correlation between heart/lung disease and anxiety and treatment options. Review healthy ways to manage with stress and anxiety.    Initial Review & Psychosocial Screening:  Initial Psych Review & Screening - 06/23/19 1411      Initial Review   Current issues with History of Depression;Current Psychotropic Meds;Current Depression      Family Dynamics   Good Support System? Yes      Barriers   Psychosocial barriers to participate in program There are no identifiable barriers or psychosocial needs.;The patient should benefit from training in stress management and relaxation.      Screening Interventions   Interventions Encouraged to exercise;To provide support and resources with identified psychosocial needs;Provide feedback about the scores to participant    Expected Outcomes Short Term goal: Utilizing psychosocial counselor, staff and physician to assist with identification of specific Stressors or current issues interfering with healing process. Setting desired goal for each stressor or current issue identified.;Long Term Goal: Stressors or current issues are controlled or  eliminated.;Short Term goal: Identification and review with participant of any Quality of Life or Depression concerns found by scoring the questionnaire.;Long Term goal: The participant improves quality of Life and PHQ9 Scores as seen by post scores and/or verbalization of changes           Quality of Life Scores:   Quality of Life - 06/26/19 1604      Quality of Life   Select Quality of Life      Quality of Life Scores   Health/Function Pre 17.93 %    Socioeconomic Pre 21.69 %    Psych/Spiritual Pre 24.71 %    Family Pre 27 %    GLOBAL Pre 21.28 %          Scores of 19 and below usually indicate a poorer quality of life in these areas.  A difference of  2-3 points is a clinically meaningful difference.  A difference of 2-3 points in the total score of the Quality of Life Index has been associated with significant improvement in overall quality of life, self-image, physical symptoms, and general health in studies assessing change in quality of life.  PHQ-9: Recent Review Flowsheet Data    Depression screen [facility] 2/9 07/20/2019 06/26/2019   Decreased Interest 1 2   Down, Depressed, Hopeless 1 1   PHQ - 2 Score 2 3   Altered sleeping 0 3   Tired, decreased energy 1 2   Change in appetite 0 1   Feeling bad or failure about yourself  0 0   Trouble concentrating 0 1   Moving slowly or fidgety/restless 0 2   Suicidal thoughts 0 0   PHQ-9 Score 3 12   Difficult doing work/chores Somewhat difficult Somewhat difficult     Interpretation of Total Score  Total Score Depression Severity:  1-4 = Minimal depression, 5-9 = Mild depression, 10-14 = Moderate depression, 15-19 = Moderately severe depression, 20-27 = Severe depression   Psychosocial Evaluation and Intervention:  Psychosocial Evaluation - 06/23/19 1413      Psychosocial Evaluation & Interventions   Comments Mr. Lewter has been on depression medication for "a while and think its genetic." He states he has been doing well  given all the health issues lately. He still is waiting to get his cataract surgery (was supposed to get it but then valve surgery happened). 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 He hopes getting more active will help him feel better    Expected Outcomes Short: attend [facility] for exercise and education. Long: develop positive self care habits           Psychosocial Re-Evaluation:  Psychosocial Re-Evaluation    [place] Name 07/20/19 1508             Psychosocial Re-Evaluation   Current issues with Current Depression;History of Depression;Current Stress Concerns;Current Psychotropic Meds       Comments Reviewed patient health questionnaire (PHQ-9) with patient for follow up. Previously, patients score indicated signs/symptoms of depression.  Reviewed to see if patient is improving symptom wise while in program.  Score improved and patient states that it is because they his health is improving.       Expected Outcomes Short: Continue to attend [facility] regularly for regular exercise and social engagement. Long: Continue to improve symptoms and manage a positive mental state.       Interventions Encouraged to attend Cardiac Rehabilitation for the exercise       Continue Psychosocial Services  Follow up required by staff              Psychosocial Discharge (Final Psychosocial Re-Evaluation):  Psychosocial Re-Evaluation - 07/20/19 1508      Psychosocial Re-Evaluation   Current issues with Current Depression;History of Depression;Current Stress Concerns;Current Psychotropic Meds    Comments Reviewed patient health questionnaire (PHQ-9) with patient for follow up. Previously, patients score indicated signs/symptoms of depression.  Reviewed to see if patient is improving symptom wise while in program.  Score improved and patient states that it is because they his health is improving.    Expected Outcomes Short: Continue to attend  [facility] regularly for regular exercise and social engagement. Long: Continue to improve symptoms and manage a positive mental state.    Interventions Encouraged to attend Cardiac Rehabilitation for the exercise    Continue Psychosocial Services  Follow up required by staff           Vocational Rehabilitation: Provide vocational rehab assistance to qualifying candidates.   Vocational Rehab Evaluation & Intervention:  Vocational Rehab - 06/23/19 1406      Initial Vocational Rehab Evaluation & Intervention   Assessment shows need for Vocational Rehabilitation No           Education: Education Goals: Education classes will be provided on a variety of topics geared toward better understanding of heart health and risk factor modification. Participant will state understanding/return demonstration of topics presented as noted by education test  scores.  Learning Barriers/Preferences:  Learning Barriers/Preferences - 06/23/19 1411      Learning Barriers/Preferences   Learning Barriers None    Learning Preferences None           General Cardiac Education Topics:  AED/CPR: - Group verbal and written instruction with the use of models to demonstrate the basic use of the AED with the basic ABC's of resuscitation.   Anatomy & Physiology of the Heart: - Group verbal and written instruction and models provide basic cardiac anatomy and physiology, with the coronary electrical and arterial systems. Review of Valvular disease and Heart Failure   Cardiac Procedures: - Group verbal and written instruction to review commonly prescribed medications for heart disease. Reviews the medication, class of the drug, and side effects. Includes the steps to properly store meds and maintain the prescription regimen. (beta blockers and nitrates)   Cardiac Medications I: - Group verbal and written instruction to review commonly prescribed medications for heart disease. Reviews the medication, class  of the drug, and side effects. Includes the steps to properly store meds and maintain the prescription regimen.   Cardiac Medications II: -Group verbal and written instruction to review commonly prescribed medications for heart disease. Reviews the medication, class of the drug, and side effects. (all other drug classes)    Go Sex-Intimacy & Heart Disease, Get SMART - Goal Setting: - Group verbal and written instruction through game format to discuss heart disease and the return to sexual intimacy. Provides group verbal and written material to discuss and apply goal setting through the application of the S.M.A.R.T. Method.   Other Matters of the Heart: - Provides group verbal, written materials and models to describe Stable Angina and Peripheral Artery. Includes description of the disease process and treatment options available to the cardiac patient.   Infection Prevention: - Provides verbal and written material to individual with discussion of infection control including proper hand washing and proper equipment cleaning during exercise session.   Cardiac Rehab from 06/26/2019 in [facility] Cardiac and Pulmonary Rehab  Date 06/26/19  Educator AS  Instruction Review Code 1- Verbalizes Understanding      Falls Prevention: - Provides verbal and written material to individual with discussion of falls prevention and safety.   Cardiac Rehab from 06/26/2019 in [facility] Cardiac and Pulmonary Rehab  Date 06/26/19  Educator AS  Instruction Review Code 1- Verbalizes Understanding      Other: -Provides group and verbal instruction on various topics (see comments)   Knowledge Questionnaire Score:  Knowledge Questionnaire Score - 06/26/19 1606      Knowledge Questionnaire Score   Pre Score 24/26           Core Components/Risk Factors/Patient Goals at Admission:  Personal Goals and Risk Factors at Admission - 06/26/19 1603      Core Components/Risk Factors/Patient Goals on Admission     Weight Management Yes    Intervention Weight Management: Develop a combined nutrition and exercise program designed to reach desired caloric intake, while maintaining appropriate intake of nutrient and fiber, sodium and fats, and appropriate energy expenditure required for the weight goal.;Weight Management: Provide education and appropriate resources to help participant work on and attain dietary goals.    Admit Weight 188 lb 1.6 oz (85.3 kg)    Goal Weight: Short Term 185 lb (83.9 kg)    Goal Weight: Long Term 180 lb (81.6 kg)    Expected Outcomes Short Term: Continue to assess and modify interventions until short term weight  is achieved;Long Term: Adherence to nutrition and physical activity/exercise program aimed toward attainment of established weight goal    Intervention Provide education on lifestyle modifcations including regular physical activity/exercise, weight management, moderate sodium restriction and increased consumption of fresh fruit, vegetables, and low fat dairy, alcohol moderation, and smoking cessation.;Monitor prescription use compliance.    Expected Outcomes Short Term: Continued assessment and intervention until BP is < 140/12m HG in hypertensive participants. < 130/839mHG in hypertensive participants with diabetes, heart failure or chronic kidney disease.;Long Term: Maintenance of blood pressure at goal levels.           Education:Diabetes - Individual verbal and written instruction to review signs/symptoms of diabetes, desired ranges of glucose level fasting, after meals and with exercise. Acknowledge that pre and post exercise glucose checks will be done for 3 sessions at entry of program.   Education: Know Your Numbers and Risk Factors: -Group verbal and written instruction about important numbers in your health.  Discussion of what are risk factors and how they play a role in the disease process.  Review of Cholesterol, Blood Pressure, Diabetes, and BMI and the role  they play in your overall health.   Core Components/Risk Factors/Patient Goals Review:   Goals and Risk Factor Review    Row Name 07/20/19 1513             Core Components/Risk Factors/Patient Goals Review   Personal Goals Review Hypertension;Lipids;Weight Management/Obesity       Review [place] to lose some weight in the program. Spoke to him ablout sodium intake and fluid intake. His blood pressure was on the low side today but does not feel dizzy. He checks his blood pressure at home sometimes. He has been on statins for 20 years and states that his lipids are good.       Expected Outcomes ShortL lose more weight in the program. Long: get weight down to 180 pounds and maintain weight loss.              Core Components/Risk Factors/Patient Goals at Discharge (Final Review):   Goals and Risk Factor Review - 07/20/19 1513      Core Components/Risk Factors/Patient Goals Review   Personal Goals Review Hypertension;Lipids;Weight Management/Obesity    Review [place] to lose some weight in the program. Spoke to him ablout sodium intake and fluid intake. His blood pressure was on the low side today but does not feel dizzy. He checks his blood pressure at home sometimes. He has been on statins for 20 years and states that his lipids are good.    Expected Outcomes ShortL lose more weight in the program. Long: get weight down to 180 pounds and maintain weight loss.           ITP Comments:  ITP Comments    Row Name 06/23/19 1404 06/26/19 1612 06/28/19 0843 06/28/19 1509 07/26/19 0703   ITP Comments Initial telephone encounter completed. Diagnosis can be found in [facility] 5/6. EP orientation scheduled for 5/17 1pm Completed 6MWT and gym orientation.  Initial ITP created and sent for review to Dr. MaEmily FilbertMedical Director. 30 Day review completed. Medical Director review done, changes made as directed,and approval shown by signature of Me[employer] researcherFirst full day of exercise!   Patient was oriented to gym and equipment including functions, settings, policies, and procedures.  Patient's individual exercise prescription and treatment plan were reviewed.  All starting workloads were established based on the results of the 6 minute walk  test done at initial orientation visit.  The plan for exercise progression was also introduced and progression will be customized based on patient's performance and goals. 30 Day review completed. Medical Director ITP review done, changes made as directed, and signed approval by Medical Director.          Comments: 30 Day review completed. Medical Director ITP review done, changes made as directed, and signed approval by Medical Director.

## 2019-07-26 NOTE — Progress Notes (Signed)
Daily Session Note  Patient Details  Name: Chase Boyd MRN: [id number] Date of Birth: Aug 01, 1945 Referring Provider:     Cardiac Rehab from 06/26/2019 in [facility] Cardiac and Pulmonary Rehab  Referring Provider Mina Marble      Encounter Date: 07/26/2019  Check In:  Session Check In - 07/26/19 1517      Check-In   Supervising physician immediately available to respond to emergencies See telemetry face sheet for immediately available ER MD    Location [facility]-Cardiac & Pulmonary Rehab    Staff Present Renita Papa, RN BSN;Melissa Caiola RDN, Rowe Pavy, BA, ACSM CEP, Exercise Physiologist    Virtual Visit No    Medication changes reported     No    Fall or balance concerns reported    No    Warm-up and Cool-down Performed on first and last piece of equipment    Resistance Training Performed Yes    VAD Patient? No    PAD/SET Patient? No      Pain Assessment   Currently in Pain? No/denies              Social History   Tobacco Use  Smoking Status Not on file    Goals Met:  Independence with exercise equipment Exercise tolerated well No report of cardiac concerns or symptoms Strength training completed today  Goals Unmet:  Not Applicable  Comments: Pt able to follow exercise prescription today without complaint.  Will continue to monitor for progression.    Dr. Emily Filbert is Medical Director for [place] and [facility].

## 2019-07-27 ENCOUNTER — Encounter: Payer: Medicare HMO | Admitting: *Deleted

## 2019-07-27 ENCOUNTER — Other Ambulatory Visit: Payer: Self-pay

## 2019-07-27 DIAGNOSIS — Z9889 Other specified postprocedural states: Secondary | ICD-10-CM

## 2019-07-27 DIAGNOSIS — Z952 Presence of prosthetic heart valve: Secondary | ICD-10-CM

## 2019-07-27 NOTE — Progress Notes (Signed)
Daily Session Note  Patient Details  Name: Chase Boyd MRN: [id number] Date of Birth: September 11, 1945 Referring Provider:     Cardiac Rehab from 06/26/2019 in [facility] Cardiac and Pulmonary Rehab  Referring Provider Mina Marble      Encounter Date: 07/27/2019  Check In:  Session Check In - 07/27/19 1504      Check-In   Supervising physician immediately available to respond to emergencies See telemetry face sheet for immediately available ER MD    Location [facility]-Cardiac & Pulmonary Rehab    Staff Present Renita Papa, RN Margurite Auerbach, MS Exercise Physiologist;Joseph Tessie Fass RCP,RRT,BSRT    Virtual Visit No    Medication changes reported     No    Fall or balance concerns reported    No    Warm-up and Cool-down Performed on first and last piece of equipment    Resistance Training Performed Yes    VAD Patient? No    PAD/SET Patient? No      Pain Assessment   Currently in Pain? No/denies              Social History   Tobacco Use  Smoking Status Not on file    Goals Met:  Independence with exercise equipment Exercise tolerated well No report of cardiac concerns or symptoms Strength training completed today  Goals Unmet:  Not Applicable  Comments: Pt able to follow exercise prescription today without complaint.  Will continue to monitor for progression.    Dr. Emily Filbert is Medical Director for [place] and [facility].

## 2019-07-31 ENCOUNTER — Encounter: Payer: Medicare HMO | Admitting: *Deleted

## 2019-07-31 ENCOUNTER — Other Ambulatory Visit: Payer: Self-pay

## 2019-07-31 DIAGNOSIS — Z952 Presence of prosthetic heart valve: Secondary | ICD-10-CM

## 2019-07-31 DIAGNOSIS — Z9889 Other specified postprocedural states: Secondary | ICD-10-CM

## 2019-07-31 NOTE — Progress Notes (Signed)
Daily Session Note  Patient Details  Name: Chase Boyd MRN: [id number] Date of Birth: [birth year] Referring Provider:     Cardiac Rehab from 06/26/2019 in [facility] Cardiac and Pulmonary Rehab  Referring Provider Wang      Encounter Date: 07/31/2019  Check In:  Session Check In - 07/31/19 1514      Check-In   Supervising physician immediately available to respond to emergencies See telemetry face sheet for immediately available ER MD    Location [facility]-Cardiac & Pulmonary Rehab    Staff Present Renita Papa, RN BSN;Joseph [address] [place], [place], ACSM CEP, Exercise Physiologist;Amanda Oletta Darter, [place], ACSM CEP, Exercise Physiologist    Virtual Visit No    Medication changes reported     No    Fall or balance concerns reported    No    Warm-up and Cool-down Performed on first and last piece of equipment    Resistance Training Performed Yes    VAD Patient? No    PAD/SET Patient? No      Pain Assessment   Currently in Pain? No/denies              Social History   Tobacco Use  Smoking Status Not on file    Goals Met:  Independence with exercise equipment Exercise tolerated well No report of cardiac concerns or symptoms Strength training completed today  Goals Unmet:  Not Applicable  Comments: Pt able to follow exercise prescription today without complaint.  Will continue to monitor for progression.    Dr. Emily Filbert is Medical Director for [place] and [facility].

## 2019-08-02 ENCOUNTER — Other Ambulatory Visit: Payer: Self-pay

## 2019-08-02 ENCOUNTER — Encounter: Payer: Medicare HMO | Admitting: *Deleted

## 2019-08-02 DIAGNOSIS — Z952 Presence of prosthetic heart valve: Secondary | ICD-10-CM | POA: Diagnosis not present

## 2019-08-02 DIAGNOSIS — Z9889 Other specified postprocedural states: Secondary | ICD-10-CM

## 2019-08-02 NOTE — Progress Notes (Signed)
Daily Session Note  Patient Details  Name: Chase Boyd MRN: [id number] Date of Birth: 24-Feb-1945 Referring Provider:     Cardiac Rehab from 06/26/2019 in [facility] Cardiac and Pulmonary Rehab  Referring Provider Wang      Encounter Date: 08/02/2019  Check In:  Session Check In - 08/02/19 1457      Check-In   Supervising physician immediately available to respond to emergencies See telemetry face sheet for immediately available ER MD    Location [facility]-Cardiac & Pulmonary Rehab    Staff Present Renita Papa, RN BSN;Melissa Caiola RDN, Rowe Pavy, BA, ACSM CEP, Exercise Physiologist    Virtual Visit No    Medication changes reported     No    Fall or balance concerns reported    No    Warm-up and Cool-down Performed on first and last piece of equipment    Resistance Training Performed Yes    VAD Patient? No    PAD/SET Patient? No      Pain Assessment   Currently in Pain? No/denies              Social History   Tobacco Use  Smoking Status Not on file    Goals Met:  Independence with exercise equipment Exercise tolerated well No report of cardiac concerns or symptoms Strength training completed today  Goals Unmet:  Not Applicable  Comments: Pt able to follow exercise prescription today without complaint.  Will continue to monitor for progression. Reviewed home exercise with pt today.  Pt plans to walk on TM at home for exercise.  Reviewed THR, pulse, RPE, sign and symptoms, pulse oximetery and when to call 911 or MD.  Also discussed weather considerations and indoor options.  Pt voiced understanding.    Dr. Emily Filbert is Medical Director for [place] and [facility].

## 2019-08-03 ENCOUNTER — Other Ambulatory Visit: Payer: Self-pay

## 2019-08-03 ENCOUNTER — Encounter: Payer: Medicare HMO | Admitting: *Deleted

## 2019-08-03 DIAGNOSIS — Z952 Presence of prosthetic heart valve: Secondary | ICD-10-CM

## 2019-08-03 DIAGNOSIS — Z9889 Other specified postprocedural states: Secondary | ICD-10-CM

## 2019-08-03 NOTE — Progress Notes (Signed)
Daily Session Note  Patient Details  Name: Chase Boyd MRN: [id number] Date of Birth: 01-14-1946 Referring Provider:     Cardiac Rehab from 06/26/2019 in [facility] Cardiac and Pulmonary Rehab  Referring Provider Wang      Encounter Date: 08/03/2019  Check In:  Session Check In - 08/03/19 1507      Check-In   Supervising physician immediately available to respond to emergencies See telemetry face sheet for immediately available ER MD    Location [facility]-Cardiac & Pulmonary Rehab    Staff Present Renita Papa, RN BSN;Joseph [address] [place], [place], [place], CCRP, CCET    Virtual Visit No    Medication changes reported     No    Fall or balance concerns reported    No    Warm-up and Cool-down Performed on first and last piece of equipment    Resistance Training Performed Yes    VAD Patient? No    PAD/SET Patient? No      Pain Assessment   Currently in Pain? No/denies              Social History   Tobacco Use  Smoking Status Not on file    Goals Met:  Independence with exercise equipment Exercise tolerated well No report of cardiac concerns or symptoms Strength training completed today  Goals Unmet:  Not Applicable  Comments: Pt able to follow exercise prescription today without complaint.  Will continue to monitor for progression.    Dr. Emily Filbert is Medical Director for [place] and [facility].

## 2019-08-07 ENCOUNTER — Encounter: Payer: Medicare HMO | Admitting: *Deleted

## 2019-08-07 ENCOUNTER — Other Ambulatory Visit: Payer: Self-pay

## 2019-08-07 DIAGNOSIS — Z9889 Other specified postprocedural states: Secondary | ICD-10-CM

## 2019-08-07 DIAGNOSIS — Z952 Presence of prosthetic heart valve: Secondary | ICD-10-CM | POA: Diagnosis not present

## 2019-08-07 NOTE — Progress Notes (Signed)
Daily Session Note  Patient Details  Name: OLLIS DAUDELIN MRN: [id number] Date of Birth: 05/21/1945 Referring Provider:     Cardiac Rehab from 06/26/2019 in [facility] Cardiac and Pulmonary Rehab  Referring Provider Wang      Encounter Date: 08/07/2019  Check In:  Session Check In - 08/07/19 1511      Check-In   Supervising physician immediately available to respond to emergencies See telemetry face sheet for immediately available ER MD    Location [facility]-Cardiac & Pulmonary Rehab    Staff Present Renita Papa, RN BSN;Joseph [address] [place], [place], [place], CCRP, [place], [place], ACSM CEP, Exercise Physiologist    Virtual Visit No    Medication changes reported     No    Fall or balance concerns reported    No    Warm-up and Cool-down Performed on first and last piece of equipment    Resistance Training Performed Yes    VAD Patient? No    PAD/SET Patient? No      Pain Assessment   Currently in Pain? No/denies              Social History   Tobacco Use  Smoking Status Not on file    Goals Met:  Independence with exercise equipment Exercise tolerated well No report of cardiac concerns or symptoms Strength training completed today  Goals Unmet:  Not Applicable  Comments: Pt able to follow exercise prescription today without complaint.  Will continue to monitor for progression.    Dr. Emily Filbert is Medical Director for [place] and [facility].

## 2019-08-09 ENCOUNTER — Other Ambulatory Visit: Payer: Self-pay

## 2019-08-09 ENCOUNTER — Encounter: Payer: Medicare HMO | Admitting: *Deleted

## 2019-08-09 DIAGNOSIS — Z952 Presence of prosthetic heart valve: Secondary | ICD-10-CM

## 2019-08-09 DIAGNOSIS — Z9889 Other specified postprocedural states: Secondary | ICD-10-CM

## 2019-08-09 NOTE — Progress Notes (Signed)
Daily Session Note  Patient Details  Name: Chase Boyd MRN: [id number] Date of Birth: [birth year] Referring Provider:     Cardiac Rehab from 06/26/2019 in [facility] Cardiac and Pulmonary Rehab  Referring Provider Mina Marble      Encounter Date: 08/09/2019  Check In:  Session Check In - 08/09/19 1503      Check-In   Supervising physician immediately available to respond to emergencies See telemetry face sheet for immediately available ER MD    Location [facility]-Cardiac & Pulmonary Rehab    Staff Present Renita Papa, RN BSN;Laureen Owens Shark, BS, RRT, CPFT;Jessica Hilham, MA, RCEP, CCRP, CCET;Amanda Sommer, BA, ACSM CEP, Exercise Physiologist    Virtual Visit No    Medication changes reported     No    Fall or balance concerns reported    No    Warm-up and Cool-down Performed on first and last piece of equipment    Resistance Training Performed Yes    VAD Patient? No    PAD/SET Patient? No      Pain Assessment   Currently in Pain? No/denies              Social History   Tobacco Use  Smoking Status Not on file    Goals Met:  Independence with exercise equipment Exercise tolerated well No report of cardiac concerns or symptoms Strength training completed today  Goals Unmet:  Not Applicable  Comments: Pt able to follow exercise prescription today without complaint.  Will continue to monitor for progression.    Dr. Emily Filbert is Medical Director for [place] and [facility].

## 2019-08-10 ENCOUNTER — Encounter: Payer: Medicare HMO | Attending: Internal Medicine | Admitting: *Deleted

## 2019-08-10 ENCOUNTER — Other Ambulatory Visit: Payer: Self-pay

## 2019-08-10 DIAGNOSIS — Z9889 Other specified postprocedural states: Secondary | ICD-10-CM

## 2019-08-10 DIAGNOSIS — Z79899 Other long term (current) drug therapy: Secondary | ICD-10-CM | POA: Insufficient documentation

## 2019-08-10 DIAGNOSIS — Z952 Presence of prosthetic heart valve: Secondary | ICD-10-CM

## 2019-08-10 DIAGNOSIS — Z7901 Long term (current) use of anticoagulants: Secondary | ICD-10-CM | POA: Diagnosis not present

## 2019-08-10 NOTE — Progress Notes (Signed)
Daily Session Note  Patient Details  Name: SHEAMUS HASTING MRN: [id number] Date of Birth: 15-Oct-1945 Referring Provider:     Cardiac Rehab from 06/26/2019 in [facility] Cardiac and Pulmonary Rehab  Referring Provider Mina Marble      Encounter Date: 08/10/2019  Check In:  Session Check In - 08/10/19 1506      Check-In   Supervising physician immediately available to respond to emergencies See telemetry face sheet for immediately available ER MD    Location [facility]-Cardiac & Pulmonary Rehab    Staff Present Renita Papa, RN BSN;Joseph [address] [place], [place], [place], CCRP, CCET    Virtual Visit No    Medication changes reported     No    Fall or balance concerns reported    No    Warm-up and Cool-down Performed on first and last piece of equipment    Resistance Training Performed Yes    VAD Patient? No    PAD/SET Patient? No      Pain Assessment   Currently in Pain? No/denies              Social History   Tobacco Use  Smoking Status Not on file    Goals Met:  Independence with exercise equipment Exercise tolerated well No report of cardiac concerns or symptoms Strength training completed today  Goals Unmet:  Not Applicable  Comments: Pt able to follow exercise prescription today without complaint.  Will continue to monitor for progression.    Dr. Emily Filbert is Medical Director for [place] and [facility].

## 2019-08-21 ENCOUNTER — Encounter: Payer: Medicare HMO | Admitting: *Deleted

## 2019-08-21 ENCOUNTER — Other Ambulatory Visit: Payer: Self-pay

## 2019-08-21 DIAGNOSIS — Z9889 Other specified postprocedural states: Secondary | ICD-10-CM

## 2019-08-21 DIAGNOSIS — Z952 Presence of prosthetic heart valve: Secondary | ICD-10-CM

## 2019-08-21 NOTE — Progress Notes (Signed)
Daily Session Note  Patient Details  Name: MUAAZ BRAU MRN: [id number] Date of Birth: 1946/01/06 Referring Provider:     Cardiac Rehab from 06/26/2019 in [facility] Cardiac and Pulmonary Rehab  Referring Provider Wang      Encounter Date: 08/21/2019  Check In:  Session Check In - 08/21/19 1548      Check-In   Supervising physician immediately available to respond to emergencies See telemetry face sheet for immediately available ER MD    Location [facility]-Cardiac & Pulmonary Rehab    Staff Present Renita Papa, RN Margurite Auerbach, MS Exercise Physiologist;Kelly Amedeo Plenty, BS, ACSM CEP, Exercise Physiologist    Virtual Visit No    Medication changes reported     No    Fall or balance concerns reported    No    Warm-up and Cool-down Performed on first and last piece of equipment    Resistance Training Performed Yes    VAD Patient? No    PAD/SET Patient? No      Pain Assessment   Currently in Pain? No/denies              Social History   Tobacco Use  Smoking Status Not on file    Goals Met:  Independence with exercise equipment Exercise tolerated well No report of cardiac concerns or symptoms Strength training completed today  Goals Unmet:  Not Applicable  Comments: Pt able to follow exercise prescription today without complaint.  Will continue to monitor for progression.    Dr. Emily Filbert is Medical Director for [place] and [facility].

## 2019-08-23 ENCOUNTER — Encounter: Payer: Self-pay | Admitting: *Deleted

## 2019-08-23 DIAGNOSIS — Z9889 Other specified postprocedural states: Secondary | ICD-10-CM

## 2019-08-23 DIAGNOSIS — Z952 Presence of prosthetic heart valve: Secondary | ICD-10-CM

## 2019-08-23 NOTE — Progress Notes (Signed)
Cardiac Individual Treatment Plan  Patient Details  Name: Chase Boyd MRN: [id number] Date of Birth: 1945-04-08 Referring Provider:     Cardiac Rehab from 06/26/2019 in [facility]iac [facility] Rehab  Referring Provider Wang      Initial Encounter Date:    Cardiac Rehab from 06/26/2019 in [facility] Cardiac and Pulmonary Rehab  Date 06/26/19      Visit Diagnosis: S/P MVR (mitral valve replacement)  S/P TVR (tricuspid valve repair)  Patient's Home Medications on Admission:  Current Outpatient Medications:  .  amiodarone (PACERONE) 200 MG tablet, Take by mouth., Disp: , Rfl:  .  amLODipine (NORVASC) 5 MG tablet, Take by mouth., Disp: , Rfl:  .  apixaban (ELIQUIS) 5 MG TABS tablet, Take by mouth., Disp: , Rfl:  .  buPROPion (WELLBUTRIN XL) 150 MG 24 hr tablet, Take by mouth., Disp: , Rfl:  .  escitalopram (LEXAPRO) 20 MG tablet, Take by mouth., Disp: , Rfl:  .  finasteride (PROSCAR) 5 MG tablet, Take 1 tablet PO daily, Disp: , Rfl:  .  furosemide (LASIX) 20 MG tablet, Take by mouth., Disp: , Rfl:  .  Multiple Vitamin (MULTI-VITAMIN) tablet, Take by mouth., Disp: , Rfl:   Past Medical History: No past medical history on file.  Tobacco Use: Social History   Tobacco Use  Smoking Status Not on file    Labs: Recent Review Flowsheet Data   There is no flowsheet data to display.      Exercise Target Goals: Exercise Program Goal: Individual exercise prescription set using results from initial 6 min walk test and THRR while considering  patient's activity barriers and safety.   Exercise Prescription Goal: Initial exercise prescription builds to 30-45 minutes a day of aerobic activity, 2-3 days per week.  Home exercise guidelines will be given to patient during program as part of exercise prescription that the participant will acknowledge.   Education: Aerobic Exercise & Resistance Training: - Gives group verbal and written instruction on the various components of  exercise. Focuses on aerobic and resistive training programs and the benefits of this training and how to safely progress through these programs..   Education: Exercise & Equipment Safety: - Individual verbal instruction and demonstration of equipment use and safety with use of the equipment.   Cardiac Rehab from 06/26/2019 in [facility] Cardiac and Pulmonary Rehab  Date 06/26/19  Educator AS  Instruction Review Code 1- Verbalizes Understanding      Education: Exercise Physiology & General Exercise Guidelines: - Group verbal and written instruction with models to review the exercise physiology of the cardiovascular system and associated critical values. Provides general exercise guidelines with specific guidelines to those with heart or lung disease.    Education: Flexibility, Balance, Mind/Body Relaxation: Provides group verbal/written instruction on the benefits of flexibility and balance training, including mind/body exercise modes such as yoga, pilates and tai chi.  Demonstration and skill practice provided.   Activity Barriers & Risk Stratification:  Activity Barriers & Cardiac Risk Stratification - 06/23/19 1409      Activity Barriers & Cardiac Risk Stratification   Activity Barriers Back Problems    Cardiac Risk Stratification High           6 Minute Walk:  6 Minute Walk    Row Name 06/26/19 1441         6 Minute Walk   Phase Initial     Distance 995 feet     Walk Time 6 minutes     #  of Rest Breaks 0     MPH 1.88     METS 2.25     RPE 11     Perceived Dyspnea  1     VO2 Peak 7.87     Symptoms No     Resting HR 95 bpm     Resting BP 100/64     Resting Oxygen Saturation  97 %     Exercise Oxygen Saturation  during 6 min walk 95 %     Max Ex. HR 107 bpm     Max Ex. BP 118/66     2 Minute Post BP 112/68            Oxygen Initial Assessment:   Oxygen Re-Evaluation:   Oxygen Discharge (Final Oxygen Re-Evaluation):   Initial Exercise Prescription:   Initial Exercise Prescription - 06/26/19 1500      Date of Initial Exercise RX and Referring Provider   Date 06/26/19    Referring Provider Mina Marble      Treadmill   MPH 1.5    Grade 0.5    Minutes 15    METs 2.25      Recumbant Bike   Level 1    RPM 60    Minutes 15    METs 2.25      NuStep   Level 2    SPM 80    Minutes 15    METs 2.25      REL-XR   Level 2    Speed 50    Minutes 15    METs 2.25      Prescription Details   Frequency (times per week) 3    Duration Progress to 30 minutes of continuous aerobic without signs/symptoms of physical distress      Intensity   THRR 40-80% of Max Heartrate 116-137    Ratings of Perceived Exertion 11-13    Perceived Dyspnea 0-4      Resistance Training   Training Prescription Yes    Weight 3 lb    Reps 10-15           Perform Capillary Blood Glucose checks as needed.  Exercise Prescription Changes:  Exercise Prescription Changes    Row Name 06/26/19 1500 07/06/19 1400 07/19/19 0900 08/01/19 1500 08/02/19 1600     Response to Exercise   Blood Pressure (Admit) 110/64 108/70 118/62 104/62 --   Blood Pressure (Exercise) 118/66 118/56 132/70 122/62 --   Blood Pressure (Exit) 112/68 100/62 90/62 100/58 --   Heart Rate (Admit) 95 bpm 63 bpm 105 bpm 105 bpm --   Heart Rate (Exercise) 107 bpm 135 bpm 136 bpm 127 bpm --   Heart Rate (Exit) 99 bpm 115 bpm 117 bpm 120 bpm --   Oxygen Saturation (Admit) 97 % -- -- -- --   Oxygen Saturation (Exercise) 95 % -- -- -- --   Rating of Perceived Exertion (Exercise) '11 16 15 13 '$ --   Perceived Dyspnea (Exercise) 1 -- -- -- --   Symptoms none -- none none --   Comments -- second day -- -- --   Duration -- Continue with 30 min of aerobic exercise without signs/symptoms of physical distress. Continue with 30 min of aerobic exercise without signs/symptoms of physical distress. Continue with 30 min of aerobic exercise without signs/symptoms of physical distress. --   Intensity -- THRR  unchanged THRR unchanged THRR unchanged --     Progression   Progression -- Continue to progress workloads to maintain intensity  without signs/symptoms of physical distress. Continue to progress workloads to maintain intensity without signs/symptoms of physical distress. Continue to progress workloads to maintain intensity without signs/symptoms of physical distress. --   Average METs -- 2.5 4.41 4 --     Resistance Training   Training Prescription -- Yes Yes Yes --   Weight -- 3 lb 3 lb 3 lb --   Reps -- 10-15 10-15 10-15 --     Interval Training   Interval Training -- -- No No --     Treadmill   MPH -- 1.5 -- -- --   Grade -- 0.5 -- -- --   Minutes -- 15 -- -- --   METs -- 2.25 -- -- --     Recumbant Bike   Level -- -- 4 6 --   RPM -- -- -- 60 --   Minutes -- -- 15 15 --   METs -- -- 4.22 3.9 --     NuStep   Level -- -- 2 5 --   SPM -- -- -- 80 --   Minutes -- -- 15 15 --   METs -- -- 4.6 4 --     Elliptical   Level -- 1 -- -- --   Speed -- 2.5 -- -- --   Minutes -- 15 -- -- --   METs -- 2.7 -- -- --     Home Exercise Plan   Plans to continue exercise at -- -- -- -- Home (comment)   Frequency -- -- -- -- Add 2 additional days to program exercise sessions.   Initial Home Exercises Provided -- -- -- -- 08/02/19   Row Name 08/15/19 1500             Response to Exercise   Blood Pressure (Admit) 124/64       Blood Pressure (Exercise) 140/66       Blood Pressure (Exit) 120/60       Heart Rate (Admit) 102 bpm       Heart Rate (Exercise) 144 bpm       Heart Rate (Exit) 134 bpm       Rating of Perceived Exertion (Exercise) 15       Symptoms none       Duration Continue with 30 min of aerobic exercise without signs/symptoms of physical distress.       Intensity THRR unchanged         Progression   Progression Continue to progress workloads to maintain intensity without signs/symptoms of physical distress.       Average METs 3.51         Resistance Training    Training Prescription Yes       Weight 3 lb       Reps 10-15         Interval Training   Interval Training No         Treadmill   MPH 3       Grade 1.5       Minutes 15       METs 3.92         NuStep   Level 5       Minutes 15       METs 48         Elliptical   Level 1       Speed 2.3       Minutes 15       METs 2.3  Biostep-RELP   Level 3       Minutes 15       METs 3         Home Exercise Plan   Plans to continue exercise at Home (comment)       Frequency Add 2 additional days to program exercise sessions.       Initial Home Exercises Provided 08/02/19              Exercise Comments:   Exercise Goals and Review:  Exercise Goals    Row Name 06/26/19 1601             Exercise Goals   Increase Physical Activity Yes       Intervention Provide advice, education, support and counseling about physical activity/exercise needs.;Develop an individualized exercise prescription for aerobic and resistive training based on initial evaluation findings, risk stratification, comorbidities and participant's personal goals.       Expected Outcomes Short Term: Attend rehab on a regular basis to increase amount of physical activity.;Long Term: Add in home exercise to make exercise part of routine and to increase amount of physical activity.;Long Term: Exercising regularly at least 3-5 days a week.       Increase Strength and Stamina Yes       Intervention Provide advice, education, support and counseling about physical activity/exercise needs.;Develop an individualized exercise prescription for aerobic and resistive training based on initial evaluation findings, risk stratification, comorbidities and participant's personal goals.       Expected Outcomes Short Term: Increase workloads from initial exercise prescription for resistance, speed, and METs.;Short Term: Perform resistance training exercises routinely during rehab and add in resistance training at home;Long Term:  Improve cardiorespiratory fitness, muscular endurance and strength as measured by increased METs and functional capacity (6MWT)       Able to understand and use rate of perceived exertion (RPE) scale Yes       Intervention Provide education and explanation on how to use RPE scale       Expected Outcomes Short Term: Able to use RPE daily in rehab to express subjective intensity level;Long Term:  Able to use RPE to guide intensity level when exercising independently       Able to understand and use Dyspnea scale Yes       Intervention Provide education and explanation on how to use Dyspnea scale       Expected Outcomes Short Term: Able to use Dyspnea scale daily in rehab to express subjective sense of shortness of breath during exertion;Long Term: Able to use Dyspnea scale to guide intensity level when exercising independently       Knowledge and understanding of Target Heart Rate Range (THRR) Yes       Intervention Provide education and explanation of THRR including how the numbers were predicted and where they are located for reference       Expected Outcomes Short Term: Able to state/look up THRR;Short Term: Able to use daily as guideline for intensity in rehab;Long Term: Able to use THRR to govern intensity when exercising independently       Able to check pulse independently Yes       Intervention Provide education and demonstration on how to check pulse in carotid and radial arteries.;Review the importance of being able to check your own pulse for safety during independent exercise       Expected Outcomes Short Term: Able to explain why pulse checking is important during independent exercise;Long Term: Able  to check pulse independently and accurately       Understanding of Exercise Prescription Yes       Intervention Provide education, explanation, and written materials on patient's individual exercise prescription       Expected Outcomes Short Term: Able to explain program exercise  prescription;Long Term: Able to explain home exercise prescription to exercise independently              Exercise Goals Re-Evaluation :  Exercise Goals Re-Evaluation    Row Name 06/26/19 1600 06/28/19 1510 07/06/19 1457 07/19/19 0909 07/20/19 1505     Exercise Goal Re-Evaluation   Exercise Goals Review Increase Physical Activity;Increase Strength and Stamina;Able to understand and use rate of perceived exertion (RPE) scale;Able to understand and use Dyspnea scale;Knowledge and understanding of Target Heart Rate Range (THRR);Able to check pulse independently;Understanding of Exercise Prescription Increase Physical Activity;Able to understand and use rate of perceived exertion (RPE) scale;Knowledge and understanding of Target Heart Rate Range (THRR);Understanding of Exercise Prescription;Increase Strength and Stamina;Able to check pulse independently -- Increase Physical Activity;Increase Strength and Stamina;Understanding of Exercise Prescription Increase Strength and Stamina;Increase Physical Activity   Comments -- Reviewed RPE and dyspnea scales, THR and program prescription with pt today.  Pt voiced understanding and was given a copy of goals to take home. Hayes is doing well in rehab.  He did the whole 15 min on elliptical his second session! Danner is doing well in rehab. He is up to 4.6 METs on the NuStep.  We will continue to monitor his progress. Tommie has a treadmill at home that he uses on his off days from rehab. He is going to increase his workloads at home as he progresses through the program. Romain has yet to do home exercise with the EP.   Expected Outcomes -- Short: Use RPE daily to regulate intensity. Long: Follow program prescription in THR. Short:  attend consistently Long : improve overall MET level Short: Continue to increase workloads Long: Continue to improve stamina. Short: inform patient about home exercise and increase workloads. Long: increase workloads for home  exercise   Row Name 08/01/19 1600 08/02/19 1616 08/03/19 1519 08/15/19 1546       Exercise Goal Re-Evaluation   Exercise Goals Review Increase Strength and Stamina;Increase Physical Activity Increase Strength and Stamina;Increase Physical Activity Increase Physical Activity;Increase Strength and Stamina;Able to check pulse independently Increase Physical Activity;Increase Strength and Stamina;Able to check pulse independently    Comments Granvel is progressing well and has increased levels on machines. EP staff will review home exercise Reviewed home exercise with pt today.  Pt plans to walk on TM at home for exercise.  Reviewed THR, pulse, RPE, sign and symptoms, pulse oximetery and when to call 911 or MD.  Also discussed weather considerations and indoor options.  Pt voiced understanding. Gable is walking on his treadmill at home about two to three days a week for 30 Minutes. He knows how to check his pulse manually. He has been shown how to do home exercise and is going to work on them. Jud has been doing well in rehab.  He has really taken to the ellipitcal!  We will continue to monitor his progress.    Expected Outcomes Short: review home exercise Long: improve overall stamina Short: monitor vitals when exercising at home Long:  exercise at home on days not at [facility] Short: work on other home exercises other than walking. Long: maintain walking and other exercise routines at home. Short: Continue to improve  stamina on the elliptical  Long; Continue to improve overall stamina.           Discharge Exercise Prescription (Final Exercise Prescription Changes):  Exercise Prescription Changes - 08/15/19 1500      Response to Exercise   Blood Pressure (Admit) 124/64    Blood Pressure (Exercise) 140/66    Blood Pressure (Exit) 120/60    Heart Rate (Admit) 102 bpm    Heart Rate (Exercise) 144 bpm    Heart Rate (Exit) 134 bpm    Rating of Perceived Exertion (Exercise) 15    Symptoms none     Duration Continue with 30 min of aerobic exercise without signs/symptoms of physical distress.    Intensity THRR unchanged      Progression   Progression Continue to progress workloads to maintain intensity without signs/symptoms of physical distress.    Average METs 3.51      Resistance Training   Training Prescription Yes    Weight 3 lb    Reps 10-15      Interval Training   Interval Training No      Treadmill   MPH 3    Grade 1.5    Minutes 15    METs 3.92      NuStep   Level 5    Minutes 15    METs 48      Elliptical   Level 1    Speed 2.3    Minutes 15    METs 2.3      Biostep-RELP   Level 3    Minutes 15    METs 3      Home Exercise Plan   Plans to continue exercise at Home (comment)    Frequency Add 2 additional days to program exercise sessions.    Initial Home Exercises Provided 08/02/19           Nutrition:  Target Goals: Understanding of nutrition guidelines, daily intake of sodium '1500mg'$ , cholesterol '200mg'$ , calories 30% from fat and 7% or less from saturated fats, daily to have 5 or more servings of fruits and vegetables.  Education: Controlling Sodium/Reading Food Labels -Group verbal and written material supporting the discussion of sodium use in heart healthy nutrition. Review and explanation with models, verbal and written materials for utilization of the food label.   Education: General Nutrition Guidelines/Fats and Fiber: -Group instruction provided by verbal, written material, models and posters to present the general guidelines for heart healthy nutrition. Gives an explanation and review of dietary fats and fiber.   Biometrics:  Pre Biometrics - 06/26/19 1602      Pre Biometrics   Height '5\' 10"'$  (1.778 m)    Weight 188 lb 1.6 oz (85.3 kg)    BMI (Calculated) 26.99    Single Leg Stand 9.94 seconds            Nutrition Therapy Plan and Nutrition Goals:  Nutrition Therapy & Goals - 07/24/19 1529      Nutrition Therapy   Diet  heart healthy, low Na    Protein (specify units) 70-75g    Fiber 30 grams    Whole Grain Foods 3 servings    Saturated Fats 12 max. grams    Fruits and Vegetables 5 servings/day    Sodium 1.5 grams      Personal Nutrition Goals   Nutrition Goal ST: add salads after dinner LT: strength back so he can work 5/10 now    Comments depends on what he wants B: cereal (  cheerios and raisin bran and oat clusters - whole milk) or breakfast bar -doesn't eat much in the morning- in order to take medication with orange juice. D: sandwiches, fast food and pizza- doesn't eat a whole lot of vegetables. salad and grilled chicken at [employer]. Eats a lot of chicken. Pt would like to eat more salads after dinner from going out. Discussed heart healthy eating.      Intervention Plan   Intervention Prescribe, educate and counsel regarding individualized specific dietary modifications aiming towards targeted core components such as weight, hypertension, lipid management, diabetes, heart failure and other comorbidities.;Nutrition handout(s) given to patient.    Expected Outcomes Short Term Goal: Understand basic principles of dietary content, such as calories, fat, sodium, cholesterol and nutrients.;Short Term Goal: A plan has been developed with personal nutrition goals set during dietitian appointment.;Long Term Goal: Adherence to prescribed nutrition plan.           Nutrition Assessments:  Nutrition Assessments - 06/26/19 1607      MEDFICTS Scores   Pre Score 35           MEDIFICTS Score Key:          ?70 Need to make dietary changes          40-70 Heart Healthy Diet         ? 40 Therapeutic Level Cholesterol Diet  Nutrition Goals Re-Evaluation:  Nutrition Goals Re-Evaluation    [place] Name 08/03/19 1528             Goals   Current Weight 189 lb (85.7 kg)       Nutrition Goal lose weight and eat less.       Comment He states that he can try to change his diet and try to eat less.        Expected Outcome Short: work on eating less. Long: lose 10 pounds within the remainder of the program.              Nutrition Goals Discharge (Final Nutrition Goals Re-Evaluation):  Nutrition Goals Re-Evaluation - 08/03/19 1528      Goals   Current Weight 189 lb (85.7 kg)    Nutrition Goal lose weight and eat less.    Comment He states that he can try to change his diet and try to eat less.    Expected Outcome Short: work on eating less. Long: lose 10 pounds within the remainder of the program.           Psychosocial: Target Goals: Acknowledge presence or absence of significant depression and/or stress, maximize coping skills, provide positive support system. Participant is able to verbalize types and ability to use techniques and skills needed for reducing stress and depression.   Education: Depression - Provides group verbal and written instruction on the correlation between heart/lung disease and depressed mood, treatment options, and the stigmas associated with seeking treatment.   Education: Sleep Hygiene -Provides group verbal and written instruction about how sleep can affect your health.  Define sleep hygiene, discuss sleep cycles and impact of sleep habits. Review good sleep hygiene tips.     Education: Stress and Anxiety: - Provides group verbal and written instruction about the health risks of elevated stress and causes of high stress.  Discuss the correlation between heart/lung disease and anxiety and treatment options. Review healthy ways to manage with stress and anxiety.    Initial Review & Psychosocial Screening:  Initial Psych Review & Screening - 06/23/19 1411  Initial Review   Current issues with History of Depression;Current Psychotropic Meds;Current Depression      Family Dynamics   Good Support System? Yes      Barriers   Psychosocial barriers to participate in program There are no identifiable barriers or psychosocial needs.;The patient should  benefit from training in stress management and relaxation.      Screening Interventions   Interventions Encouraged to exercise;To provide support and resources with identified psychosocial needs;Provide feedback about the scores to participant    Expected Outcomes Short Term goal: Utilizing psychosocial counselor, staff and physician to assist with identification of specific Stressors or current issues interfering with healing process. Setting desired goal for each stressor or current issue identified.;Long Term Goal: Stressors or current issues are controlled or eliminated.;Short Term goal: Identification and review with participant of any Quality of Life or Depression concerns found by scoring the questionnaire.;Long Term goal: The participant improves quality of Life and PHQ9 Scores as seen by post scores and/or verbalization of changes           Quality of Life Scores:   Quality of Life - 06/26/19 1604      Quality of Life   Select Quality of Life      Quality of Life Scores   Health/Function Pre 17.93 %    Socioeconomic Pre 21.69 %    Psych/Spiritual Pre 24.71 %    Family Pre 27 %    GLOBAL Pre 21.28 %          Scores of 19 and below usually indicate a poorer quality of life in these areas.  A difference of  2-3 points is a clinically meaningful difference.  A difference of 2-3 points in the total score of the Quality of Life Index has been associated with significant improvement in overall quality of life, self-image, physical symptoms, and general health in studies assessing change in quality of life.  PHQ-9: Recent Review Flowsheet Data    Depression screen [facility] 2/9 08/02/2019 07/26/2019 07/20/2019 06/26/2019   Decreased Interest '1 1 1 2   '$ Down, Depressed, Hopeless 0 '1 1 1   '$ PHQ - 2 Score '1 2 2 3   '$ Altered sleeping 0 0 0 3   Tired, decreased energy '1 1 1 2   '$ Change in appetite 0 0 0 1   Feeling bad or failure about yourself  0 0 0 0   Trouble concentrating 0 0 0 1   Moving  slowly or fidgety/restless 0 0 0 2   Suicidal thoughts 0 0 0 0   PHQ-9 Score '2 3 3 12   '$ Difficult doing work/chores Not difficult at all Not difficult at all Somewhat difficult Somewhat difficult     Interpretation of Total Score  Total Score Depression Severity:  1-4 = Minimal depression, 5-9 = Mild depression, 10-14 = Moderate depression, 15-19 = Moderately severe depression, 20-27 = Severe depression   Psychosocial Evaluation and Intervention:  Psychosocial Evaluation - 06/23/19 1413      Psychosocial Evaluation & Interventions   Comments Mr. Rickman has been on depression medication for "a while and think its genetic." He states he has been doing well given all the health issues lately. He still is waiting to get his cataract surgery (was supposed to get it but then valve surgery happened). 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 He hopes getting more active will help him  feel better    Expected Outcomes Short: attend [facility] for exercise and education. Long: develop positive self care habits           Psychosocial Re-Evaluation:  Psychosocial Re-Evaluation    Bern Name 07/20/19 1508 08/03/19 1522           Psychosocial Re-Evaluation   Current issues with Current Depression;History of Depression;Current Stress Concerns;Current Psychotropic Meds Current Depression;History of Depression;Current Psychotropic Meds;Current Stress Concerns      Comments Reviewed patient health questionnaire (PHQ-9) with patient for follow up. Previously, patients score indicated signs/symptoms of depression.  Reviewed to see if patient is improving symptom wise while in program.  Score improved and patient states that it is because they his health is improving. Patient tries to not get upset about things and states that depression runs in his family. He has no other concerns about his mental health.      Expected Outcomes Short: Continue to  attend [facility] regularly for regular exercise and social engagement. Long: Continue to improve symptoms and manage a positive mental state. Short: Continue to exercise regularly to support mental health and notify staff of any changes. Long: maintain mental health and well being through teaching of rehab or prescribed medications independently.      Interventions Encouraged to attend Cardiac Rehabilitation for the exercise Encouraged to attend Cardiac Rehabilitation for the exercise      Continue Psychosocial Services  Follow up required by staff Follow up required by staff             Psychosocial Discharge (Final Psychosocial Re-Evaluation):  Psychosocial Re-Evaluation - 08/03/19 1522      Psychosocial Re-Evaluation   Current issues with Current Depression;History of Depression;Current Psychotropic Meds;Current Stress Concerns    Comments Patient tries to not get upset about things and states that depression runs in his family. He has no other concerns about his mental health.    Expected Outcomes Short: Continue to exercise regularly to support mental health and notify staff of any changes. Long: maintain mental health and well being through teaching of rehab or prescribed medications independently.    Interventions Encouraged to attend Cardiac Rehabilitation for the exercise    Continue Psychosocial Services  Follow up required by staff           Vocational Rehabilitation: Provide vocational rehab assistance to qualifying candidates.   Vocational Rehab Evaluation & Intervention:  Vocational Rehab - 06/23/19 1406      Initial Vocational Rehab Evaluation & Intervention   Assessment shows need for Vocational Rehabilitation No           Education: Education Goals: Education classes will be provided on a variety of topics geared toward better understanding of heart health and risk factor modification. Participant will state understanding/return demonstration of topics presented  as noted by education test scores.  Learning Barriers/Preferences:  Learning Barriers/Preferences - 06/23/19 1411      Learning Barriers/Preferences   Learning Barriers None    Learning Preferences None           General Cardiac Education Topics:  AED/CPR: - Group verbal and written instruction with the use of models to demonstrate the basic use of the AED with the basic ABC's of resuscitation.   Anatomy & Physiology of the Heart: - Group verbal and written instruction and models provide basic cardiac anatomy and physiology, with the coronary electrical and arterial systems. Review of Valvular disease and Heart Failure   Cardiac Procedures: - Group verbal and written  instruction to review commonly prescribed medications for heart disease. Reviews the medication, class of the drug, and side effects. Includes the steps to properly store meds and maintain the prescription regimen. (beta blockers and nitrates)   Cardiac Medications I: - Group verbal and written instruction to review commonly prescribed medications for heart disease. Reviews the medication, class of the drug, and side effects. Includes the steps to properly store meds and maintain the prescription regimen.   Cardiac Medications II: -Group verbal and written instruction to review commonly prescribed medications for heart disease. Reviews the medication, class of the drug, and side effects. (all other drug classes)    Go Sex-Intimacy & Heart Disease, Get SMART - Goal Setting: - Group verbal and written instruction through game format to discuss heart disease and the return to sexual intimacy. Provides group verbal and written material to discuss and apply goal setting through the application of the S.M.A.R.T. Method.   Other Matters of the Heart: - Provides group verbal, written materials and models to describe Stable Angina and Peripheral Artery. Includes description of the disease process and treatment options  available to the cardiac patient.   Infection Prevention: - Provides verbal and written material to individual with discussion of infection control including proper hand washing and proper equipment cleaning during exercise session.   Cardiac Rehab from 06/26/2019 in [facility] Cardiac and Pulmonary Rehab  Date 06/26/19  Educator AS  Instruction Review Code 1- Verbalizes Understanding      Falls Prevention: - Provides verbal and written material to individual with discussion of falls prevention and safety.   Cardiac Rehab from 06/26/2019 in [facility] Cardiac and Pulmonary Rehab  Date 06/26/19  Educator AS  Instruction Review Code 1- Verbalizes Understanding      Other: -Provides group and verbal instruction on various topics (see comments)   Knowledge Questionnaire Score:  Knowledge Questionnaire Score - 06/26/19 1606      Knowledge Questionnaire Score   Pre Score 24/26           Core Components/Risk Factors/Patient Goals at Admission:  Personal Goals and Risk Factors at Admission - 06/26/19 1603      Core Components/Risk Factors/Patient Goals on Admission    Weight Management Yes    Intervention Weight Management: Develop a combined nutrition and exercise program designed to reach desired caloric intake, while maintaining appropriate intake of nutrient and fiber, sodium and fats, and appropriate energy expenditure required for the weight goal.;Weight Management: Provide education and appropriate resources to help participant work on and attain dietary goals.    Admit Weight 188 lb 1.6 oz (85.3 kg)    Goal Weight: Short Term 185 lb (83.9 kg)    Goal Weight: Long Term 180 lb (81.6 kg)    Expected Outcomes Short Term: Continue to assess and modify interventions until short term weight is achieved;Long Term: Adherence to nutrition and physical activity/exercise program aimed toward attainment of established weight goal    Intervention Provide education on lifestyle modifcations including  regular physical activity/exercise, weight management, moderate sodium restriction and increased consumption of fresh fruit, vegetables, and low fat dairy, alcohol moderation, and smoking cessation.;Monitor prescription use compliance.    Expected Outcomes Short Term: Continued assessment and intervention until BP is < 140/52m HG in hypertensive participants. < 130/815mHG in hypertensive participants with diabetes, heart failure or chronic kidney disease.;Long Term: Maintenance of blood pressure at goal levels.           Education:Diabetes - Individual verbal and written instruction to  review signs/symptoms of diabetes, desired ranges of glucose level fasting, after meals and with exercise. Acknowledge that pre and post exercise glucose checks will be done for 3 sessions at entry of program.   Education: Know Your Numbers and Risk Factors: -Group verbal and written instruction about important numbers in your health.  Discussion of what are risk factors and how they play a role in the disease process.  Review of Cholesterol, Blood Pressure, Diabetes, and BMI and the role they play in your overall health.   Core Components/Risk Factors/Patient Goals Review:   Goals and Risk Factor Review    Row Name 07/20/19 1513 08/03/19 1525           Core Components/Risk Factors/Patient Goals Review   Personal Goals Review Hypertension;Lipids;Weight Management/Obesity Weight Management/Obesity;Hypertension;Lipids      Review Anthoney wants to lose some weight in the program. Spoke to him ablout sodium intake and fluid intake. His blood pressure was on the low side today but does not feel dizzy. He checks his blood pressure at home sometimes. He has been on statins for 20 years and states that his lipids are good. Patient has been able to keep his blood pressure under control. His lipids have been in check and has no questions on his medications. He wants to continue to lose weight and work down to 180  pounds.      Expected Outcomes ShortL lose more weight in the program. Long: get weight down to 180 pounds and maintain weight loss. Short: work on weight loss with diet. Long: maintain a heart healthy diet.             Core Components/Risk Factors/Patient Goals at Discharge (Final Review):   Goals and Risk Factor Review - 08/03/19 1525      Core Components/Risk Factors/Patient Goals Review   Personal Goals Review Weight Management/Obesity;Hypertension;Lipids    Review Patient has been able to keep his blood pressure under control. His lipids have been in check and has no questions on his medications. He wants to continue to lose weight and work down to 180 pounds.    Expected Outcomes Short: work on weight loss with diet. Long: maintain a heart healthy diet.           ITP Comments:  ITP Comments    Row Name 06/23/19 1404 06/26/19 1612 06/28/19 0843 06/28/19 1509 07/26/19 0703   ITP Comments Initial telephone encounter completed. Diagnosis can be found in [facility] 5/6. EP orientation scheduled for 5/17 1pm Completed 6MWT and gym orientation.  Initial ITP created and sent for review to Dr. Emily Filbert, Medical Director. 30 Day review completed. Medical Director review done, changes made as directed,and approval shown by signature of Market researcher. First full day of exercise!  Patient was oriented to gym and equipment including functions, settings, policies, and procedures.  Patient's individual exercise prescription and treatment plan were reviewed.  All starting workloads were established based on the results of the 6 minute walk test done at initial orientation visit.  The plan for exercise progression was also introduced and progression will be customized based on patient's performance and goals. 30 Day review completed. Medical Director ITP review done, changes made as directed, and signed approval by Medical Director.   [place] Name 08/23/19 1126           ITP Comments 30 Day review  completed. Medical Director ITP review done, changes made as directed, and signed approval by Medical Director.  Comments:

## 2019-08-24 ENCOUNTER — Encounter: Payer: Medicare HMO | Admitting: *Deleted

## 2019-08-24 ENCOUNTER — Other Ambulatory Visit: Payer: Self-pay

## 2019-08-24 DIAGNOSIS — Z952 Presence of prosthetic heart valve: Secondary | ICD-10-CM

## 2019-08-24 DIAGNOSIS — Z9889 Other specified postprocedural states: Secondary | ICD-10-CM

## 2019-08-24 NOTE — Progress Notes (Signed)
Daily Session Note  Patient Details  Name: PATTRICK BADY MRN: [id number] Date of Birth: May 28, 1945 Referring Provider:     Cardiac Rehab from 06/26/2019 in [facility] Cardiac and Pulmonary Rehab  Referring Provider Mina Marble      Encounter Date: 08/24/2019  Check In:  Session Check In - 08/24/19 1538      Check-In   Supervising physician immediately available to respond to emergencies See telemetry face sheet for immediately available ER MD    Location [facility]-Cardiac & Pulmonary Rehab    Staff Present Renita Papa, RN BSN;Joseph Hood RCP,RRT,BSRT;Laureen [place], [place], RRT, CPFT    Virtual Visit No    Medication changes reported     No    Fall or balance concerns reported    No    Warm-up and Cool-down Performed on first and last piece of equipment    Resistance Training Performed Yes    VAD Patient? No    PAD/SET Patient? No      Pain Assessment   Currently in Pain? No/denies              Social History   Tobacco Use  Smoking Status Not on file    Goals Met:  Independence with exercise equipment Exercise tolerated well No report of cardiac concerns or symptoms Strength training completed today  Goals Unmet:  Not Applicable  Comments: Pt able to follow exercise prescription today without complaint.  Will continue to monitor for progression.    Dr. Emily Filbert is Medical Director for [place] and [facility].

## 2019-08-28 ENCOUNTER — Other Ambulatory Visit: Payer: Self-pay

## 2019-08-28 ENCOUNTER — Encounter: Payer: Medicare HMO | Admitting: *Deleted

## 2019-08-28 DIAGNOSIS — Z9889 Other specified postprocedural states: Secondary | ICD-10-CM

## 2019-08-28 DIAGNOSIS — Z952 Presence of prosthetic heart valve: Secondary | ICD-10-CM | POA: Diagnosis not present

## 2019-08-28 NOTE — Progress Notes (Signed)
Daily Session Note  Patient Details  Name: DALESSANDRO BALDYGA MRN: [id number] Date of Birth: 17-Jan-1946 Referring Provider:     Cardiac Rehab from 06/26/2019 in [facility] Cardiac and Pulmonary Rehab  Referring Provider Mina Marble      Encounter Date: 08/28/2019  Check In:  Session Check In - 08/28/19 1539      Check-In   Supervising physician immediately available to respond to emergencies See telemetry face sheet for immediately available ER MD    Location [facility]-Cardiac & Pulmonary Rehab    Staff Present Renita Papa, RN Margurite Auerbach, MS Exercise Physiologist;Kelly Amedeo Plenty, BS, ACSM CEP, Exercise Physiologist    Virtual Visit No    Medication changes reported     No    Fall or balance concerns reported    No    Warm-up and Cool-down Performed on first and last piece of equipment    Resistance Training Performed Yes    VAD Patient? No    PAD/SET Patient? No      Pain Assessment   Currently in Pain? No/denies              Social History   Tobacco Use  Smoking Status Not on file    Goals Met:  Independence with exercise equipment Exercise tolerated well No report of cardiac concerns or symptoms Strength training completed today  Goals Unmet:  Not Applicable  Comments: Pt able to follow exercise prescription today without complaint.  Will continue to monitor for progression.    Dr. Emily Filbert is Medical Director for [place] and [facility].

## 2019-08-30 ENCOUNTER — Encounter: Payer: Medicare HMO | Admitting: *Deleted

## 2019-08-30 ENCOUNTER — Other Ambulatory Visit: Payer: Self-pay

## 2019-08-30 DIAGNOSIS — Z952 Presence of prosthetic heart valve: Secondary | ICD-10-CM

## 2019-08-30 DIAGNOSIS — Z9889 Other specified postprocedural states: Secondary | ICD-10-CM

## 2019-08-30 NOTE — Progress Notes (Signed)
Daily Session Note  Patient Details  Name: Chase Boyd MRN: [id number] Date of Birth: 1945-08-21 Referring Provider:     Cardiac Rehab from 06/26/2019 in [facility] Cardiac and Pulmonary Rehab  Referring Provider Wang      Encounter Date: 08/30/2019  Check In:  Session Check In - 08/30/19 1541      Check-In   Supervising physician immediately available to respond to emergencies See telemetry face sheet for immediately available ER MD    Location [facility]-Cardiac & Pulmonary Rehab    Staff Present Renita Papa, RN Margurite Auerbach, MS Exercise Physiologist;Amanda Oletta Darter, [place], ACSM CEP, Exercise Physiologist    Virtual Visit No    Medication changes reported     No    Fall or balance concerns reported    No    Warm-up and Cool-down Performed on first and last piece of equipment    Resistance Training Performed Yes    VAD Patient? No    PAD/SET Patient? No      Pain Assessment   Currently in Pain? No/denies              Social History   Tobacco Use  Smoking Status Not on file    Goals Met:  Independence with exercise equipment Exercise tolerated well No report of cardiac concerns or symptoms Strength training completed today  Goals Unmet:  Not Applicable  Comments: Pt able to follow exercise prescription today without complaint.  Will continue to monitor for progression.    Dr. Emily Filbert is Medical Director for [place] and [facility].

## 2019-08-31 ENCOUNTER — Encounter: Payer: Medicare HMO | Admitting: *Deleted

## 2019-08-31 ENCOUNTER — Other Ambulatory Visit: Payer: Self-pay

## 2019-08-31 DIAGNOSIS — Z952 Presence of prosthetic heart valve: Secondary | ICD-10-CM | POA: Diagnosis not present

## 2019-08-31 DIAGNOSIS — Z9889 Other specified postprocedural states: Secondary | ICD-10-CM

## 2019-08-31 NOTE — Progress Notes (Signed)
Daily Session Note  Patient Details  Name: Chase Boyd MRN: [id number] Date of Birth: May 09, 1945 Referring Provider:     Cardiac Rehab from 06/26/2019 in [facility] Cardiac and Pulmonary Rehab  Referring Provider Wang      Encounter Date: 08/31/2019  Check In:  Session Check In - 08/31/19 1550      Check-In   Supervising physician immediately available to respond to emergencies See telemetry face sheet for immediately available ER MD    Location [facility]-Cardiac & Pulmonary Rehab    Staff Present Renita Papa, RN BSN;Joseph Foy [place], [place], ACSM CEP, Exercise Physiologist    Virtual Visit No    Medication changes reported     No    Fall or balance concerns reported    No    Warm-up and Cool-down Performed on first and last piece of equipment    Resistance Training Performed Yes    VAD Patient? No    PAD/SET Patient? No      Pain Assessment   Currently in Pain? No/denies              Social History   Tobacco Use  Smoking Status Not on file    Goals Met:  Independence with exercise equipment Exercise tolerated well No report of cardiac concerns or symptoms Strength training completed today  Goals Unmet:  Not Applicable  Comments: Pt able to follow exercise prescription today without complaint.  Will continue to monitor for progression.    Dr. Emily Filbert is Medical Director for [place] and [facility].

## 2019-09-04 ENCOUNTER — Other Ambulatory Visit: Payer: Self-pay

## 2019-09-04 ENCOUNTER — Encounter: Payer: Medicare HMO | Admitting: *Deleted

## 2019-09-04 DIAGNOSIS — Z952 Presence of prosthetic heart valve: Secondary | ICD-10-CM

## 2019-09-04 DIAGNOSIS — Z9889 Other specified postprocedural states: Secondary | ICD-10-CM

## 2019-09-04 NOTE — Progress Notes (Signed)
Daily Session Note  Patient Details  Name: ARNE SCHLENDER MRN: [id number] Date of Birth: September 10, 1945 Referring Provider:     Cardiac Rehab from 06/26/2019 in [facility] Cardiac and Pulmonary Rehab  Referring Provider Mina Marble      Encounter Date: 09/04/2019  Check In:  Session Check In - 09/04/19 1535      Check-In   Supervising physician immediately available to respond to emergencies See telemetry face sheet for immediately available ER MD    Location [facility]-Cardiac & Pulmonary Rehab    Staff Present Renita Papa, RN BSN;Jessica [place], MA, RCEP, CCRP, [place], BS, ACSM CEP, Exercise Physiologist;Amanda Oletta Darter, [place], ACSM CEP, Exercise Physiologist    Virtual Visit No    Medication changes reported     No    Fall or balance concerns reported    No    Warm-up and Cool-down Performed on first and last piece of equipment    Resistance Training Performed Yes    VAD Patient? No    PAD/SET Patient? No      Pain Assessment   Currently in Pain? No/denies              Social History   Tobacco Use  Smoking Status Not on file    Goals Met:  Independence with exercise equipment Exercise tolerated well No report of cardiac concerns or symptoms Strength training completed today  Goals Unmet:  Not Applicable  Comments: Pt able to follow exercise prescription today without complaint.  Will continue to monitor for progression.    Dr. Emily Filbert is Medical Director for [place] and [facility].

## 2019-09-06 ENCOUNTER — Encounter: Payer: Medicare HMO | Admitting: *Deleted

## 2019-09-06 ENCOUNTER — Other Ambulatory Visit: Payer: Self-pay

## 2019-09-06 DIAGNOSIS — Z952 Presence of prosthetic heart valve: Secondary | ICD-10-CM

## 2019-09-06 DIAGNOSIS — Z9889 Other specified postprocedural states: Secondary | ICD-10-CM

## 2019-09-06 NOTE — Progress Notes (Signed)
Daily Session Note  Patient Details  Name: ARMANDO BUKHARI MRN: [id number] Date of Birth: 30-Jan-1946 Referring Provider:     Cardiac Rehab from 06/26/2019 in [facility] Cardiac and Pulmonary Rehab  Referring Provider Wang      Encounter Date: 09/06/2019  Check In:  Session Check In - 09/06/19 1546      Check-In   Supervising physician immediately available to respond to emergencies See telemetry face sheet for immediately available ER MD    Location [facility]-Cardiac & Pulmonary Rehab    Staff Present Renita Papa, RN BSN;Joseph Hood RCP,RRT,BSRT;Laureen [place], [place], RRT, CPFT;Amanda Oletta Darter, [place], ACSM CEP, Exercise Physiologist    Virtual Visit No    Medication changes reported     No    Fall or balance concerns reported    No    Warm-up and Cool-down Performed on first and last piece of equipment    Resistance Training Performed Yes    VAD Patient? No    PAD/SET Patient? No      Pain Assessment   Currently in Pain? No/denies              Social History   Tobacco Use  Smoking Status Not on file    Goals Met:  Independence with exercise equipment Exercise tolerated well No report of cardiac concerns or symptoms Strength training completed today  Goals Unmet:  Not Applicable  Comments: Pt able to follow exercise prescription today without complaint.  Will continue to monitor for progression.    Dr. Emily Filbert is Medical Director for [place] and [facility].

## 2019-09-07 ENCOUNTER — Encounter: Payer: Medicare HMO | Admitting: *Deleted

## 2019-09-07 DIAGNOSIS — Z952 Presence of prosthetic heart valve: Secondary | ICD-10-CM | POA: Diagnosis not present

## 2019-09-07 DIAGNOSIS — Z9889 Other specified postprocedural states: Secondary | ICD-10-CM

## 2019-09-07 NOTE — Patient Instructions (Signed)
Discharge Patient Instructions  Patient Details  Name: Chase Boyd MRN: [id number] Date of Birth: [birth year] Referring Provider:  Derinda Sis, MD   Number of Visits: 27  Reason for Discharge:  Patient reached a stable level of exercise. Patient independent in their exercise. Patient has met program and personal goals.  Smoking History:  Social History   Tobacco Use  Smoking Status Not on file    Diagnosis:  S/P TVR (tricuspid valve repair)  S/P MVR (mitral valve replacement)  Initial Exercise Prescription:  Initial Exercise Prescription - 06/26/19 1500      Date of Initial Exercise RX and Referring Provider   Date 06/26/19    Referring Provider Mina Marble      Treadmill   MPH 1.5    Grade 0.5    Minutes 15    METs 2.25      Recumbant Bike   Level 1    RPM 60    Minutes 15    METs 2.25      NuStep   Level 2    SPM 80    Minutes 15    METs 2.25      REL-XR   Level 2    Speed 50    Minutes 15    METs 2.25      Prescription Details   Frequency (times per week) 3    Duration Progress to 30 minutes of continuous aerobic without signs/symptoms of physical distress      Intensity   THRR 40-80% of Max Heartrate 116-137    Ratings of Perceived Exertion 11-13    Perceived Dyspnea 0-4      Resistance Training   Training Prescription Yes    Weight 3 lb    Reps 10-15           Discharge Exercise Prescription (Final Exercise Prescription Changes):  Exercise Prescription Changes - 08/29/19 1400      Response to Exercise   Blood Pressure (Admit) 118/70    Blood Pressure (Exercise) 148/82    Blood Pressure (Exit) 98/62    Heart Rate (Admit) 115 bpm    Heart Rate (Exercise) 151 bpm    Heart Rate (Exit) 144 bpm    Rating of Perceived Exertion (Exercise) 13    Symptoms none    Duration Progress to 30 minutes of  aerobic without signs/symptoms of physical distress    Intensity THRR unchanged      Progression   Progression Continue to progress  workloads to maintain intensity without signs/symptoms of physical distress.    Average METs 3.32      Resistance Training   Training Prescription Yes    Weight 3 lb    Reps 10-15      Interval Training   Interval Training No      Treadmill   MPH 3    Grade 1.5    Minutes 15    METs 3.92      NuStep   Level 4    Minutes 15    METs 3.9      Elliptical   Level 1    Speed 2.3    Minutes 15      Biostep-RELP   Level 2    Minutes 15    METs 3      Home Exercise Plan   Plans to continue exercise at Home (comment)    Frequency Add 2 additional days to program exercise sessions.    Initial Home Exercises Provided 08/02/19  Functional Capacity:  6 Minute Walk    Row Name 06/26/19 1441 09/06/19 1549       6 Minute Walk   Phase Initial Discharge    Distance 995 feet 1670 feet    Distance % Change -- 675 %    Distance Feet Change -- 675 ft    Walk Time 6 minutes 6 minutes    # of Rest Breaks 0 0    MPH 1.88 3.16    METS 2.25 3.8    RPE 11 13    Perceived Dyspnea  1 --    VO2 Peak 7.87 13.3    Symptoms No No    Resting HR 95 bpm 109 bpm    Resting BP 100/64 112/66    Resting Oxygen Saturation  97 % --    Exercise Oxygen Saturation  during 6 min walk 95 % --    Max Ex. HR 107 bpm 139 bpm    Max Ex. BP 118/66 132/76    2 Minute Post BP 112/68 --           Quality of Life:  Quality of Life - 09/06/19 1609      Quality of Life   Select Quality of Life      Quality of Life Scores   Health/Function Pre 17.93 %    Health/Function Post 26.31 %    Health/Function % Change 46.74 %    Socioeconomic Pre 21.69 %    Socioeconomic Post 25.83 %    Socioeconomic % Change  19.09 %    Psych/Spiritual Pre 24.71 %    Psych/Spiritual Post 23.64 %    Psych/Spiritual % Change -4.33 %    Family Pre 27 %    Family Post 25.13 %    Family % Change -6.93 %    GLOBAL Pre 21.28 %    GLOBAL Post 25.43 %    GLOBAL % Change 19.5 %           Personal  Goals: Goals established at orientation with interventions provided to work toward goal.  Personal Goals and Risk Factors at Admission - 06/26/19 1603      Core Components/Risk Factors/Patient Goals on Admission    Weight Management Yes    Intervention Weight Management: Develop a combined nutrition and exercise program designed to reach desired caloric intake, while maintaining appropriate intake of nutrient and fiber, sodium and fats, and appropriate energy expenditure required for the weight goal.;Weight Management: Provide education and appropriate resources to help participant work on and attain dietary goals.    Admit Weight 188 lb 1.6 oz (85.3 kg)    Goal Weight: Short Term 185 lb (83.9 kg)    Goal Weight: Long Term 180 lb (81.6 kg)    Expected Outcomes Short Term: Continue to assess and modify interventions until short term weight is achieved;Long Term: Adherence to nutrition and physical activity/exercise program aimed toward attainment of established weight goal    Intervention Provide education on lifestyle modifcations including regular physical activity/exercise, weight management, moderate sodium restriction and increased consumption of fresh fruit, vegetables, and low fat dairy, alcohol moderation, and smoking cessation.;Monitor prescription use compliance.    Expected Outcomes Short Term: Continued assessment and intervention until BP is < 140/63mm HG in hypertensive participants. < 130/67mm HG in hypertensive participants with diabetes, heart failure or chronic kidney disease.;Long Term: Maintenance of blood pressure at goal levels.            Personal Goals Discharge:  Goals  and Risk Factor Review - 09/04/19 1607      Core Components/Risk Factors/Patient Goals Review   Personal Goals Review Weight Management/Obesity;Hypertension;Lipids    Review Kee is doing well in rehab.  He is wanting to graduate early.  He is good with his exericse at home and working on his weight.   He is planning to join [employer] to continue to exercise. He is also looking at getting a recumbent bike to maintain.  Pressures are still working on his pressures.    Expected Outcomes Short: Graduate Long; Continue to monitor risk factors.           Exercise Goals and Review:  Exercise Goals    Row Name 06/26/19 1601             Exercise Goals   Increase Physical Activity Yes       Intervention Provide advice, education, support and counseling about physical activity/exercise needs.;Develop an individualized exercise prescription for aerobic and resistive training based on initial evaluation findings, risk stratification, comorbidities and participant's personal goals.       Expected Outcomes Short Term: Attend rehab on a regular basis to increase amount of physical activity.;Long Term: Add in home exercise to make exercise part of routine and to increase amount of physical activity.;Long Term: Exercising regularly at least 3-5 days a week.       Increase Strength and Stamina Yes       Intervention Provide advice, education, support and counseling about physical activity/exercise needs.;Develop an individualized exercise prescription for aerobic and resistive training based on initial evaluation findings, risk stratification, comorbidities and participant's personal goals.       Expected Outcomes Short Term: Increase workloads from initial exercise prescription for resistance, speed, and METs.;Short Term: Perform resistance training exercises routinely during rehab and add in resistance training at home;Long Term: Improve cardiorespiratory fitness, muscular endurance and strength as measured by increased METs and functional capacity (6MWT)       Able to understand and use rate of perceived exertion (RPE) scale Yes       Intervention Provide education and explanation on how to use RPE scale       Expected Outcomes Short Term: Able to use RPE daily in rehab to express subjective  intensity level;Long Term:  Able to use RPE to guide intensity level when exercising independently       Able to understand and use Dyspnea scale Yes       Intervention Provide education and explanation on how to use Dyspnea scale       Expected Outcomes Short Term: Able to use Dyspnea scale daily in rehab to express subjective sense of shortness of breath during exertion;Long Term: Able to use Dyspnea scale to guide intensity level when exercising independently       Knowledge and understanding of Target Heart Rate Range (THRR) Yes       Intervention Provide education and explanation of THRR including how the numbers were predicted and where they are located for reference       Expected Outcomes Short Term: Able to state/look up THRR;Short Term: Able to use daily as guideline for intensity in rehab;Long Term: Able to use THRR to govern intensity when exercising independently       Able to check pulse independently Yes       Intervention Provide education and demonstration on how to check pulse in carotid and radial arteries.;Review the importance of being able to check your own pulse for safety  during independent exercise       Expected Outcomes Short Term: Able to explain why pulse checking is important during independent exercise;Long Term: Able to check pulse independently and accurately       Understanding of Exercise Prescription Yes       Intervention Provide education, explanation, and written materials on patient's individual exercise prescription       Expected Outcomes Short Term: Able to explain program exercise prescription;Long Term: Able to explain home exercise prescription to exercise independently              Exercise Goals Re-Evaluation:  Exercise Goals Re-Evaluation    Row Name 06/26/19 1600 06/28/19 1510 07/06/19 1457 07/19/19 0909 07/20/19 1505     Exercise Goal Re-Evaluation   Exercise Goals Review Increase Physical Activity;Increase Strength and Stamina;Able to  understand and use rate of perceived exertion (RPE) scale;Able to understand and use Dyspnea scale;Knowledge and understanding of Target Heart Rate Range (THRR);Able to check pulse independently;Understanding of Exercise Prescription Increase Physical Activity;Able to understand and use rate of perceived exertion (RPE) scale;Knowledge and understanding of Target Heart Rate Range (THRR);Understanding of Exercise Prescription;Increase Strength and Stamina;Able to check pulse independently -- Increase Physical Activity;Increase Strength and Stamina;Understanding of Exercise Prescription Increase Strength and Stamina;Increase Physical Activity   Comments -- Reviewed RPE and dyspnea scales, THR and program prescription with pt today.  Pt voiced understanding and was given a copy of goals to take home. Howard is doing well in rehab.  He did the whole 15 min on elliptical his second session! Matin is doing well in rehab. He is up to 4.6 METs on the NuStep.  We will continue to monitor his progress. Dionis has a treadmill at home that he uses on his off days from rehab. He is going to increase his workloads at home as he progresses through the program. Isaak has yet to do home exercise with the EP.   Expected Outcomes -- Short: Use RPE daily to regulate intensity. Long: Follow program prescription in THR. Short:  attend consistently Long : improve overall MET level Short: Continue to increase workloads Long: Continue to improve stamina. Short: inform patient about home exercise and increase workloads. Long: increase workloads for home exercise   Row Name 08/01/19 1600 08/02/19 1616 08/03/19 1519 08/15/19 1546 08/29/19 1418     Exercise Goal Re-Evaluation   Exercise Goals Review Increase Strength and Stamina;Increase Physical Activity Increase Strength and Stamina;Increase Physical Activity Increase Physical Activity;Increase Strength and Stamina;Able to check pulse independently Increase Physical  Activity;Increase Strength and Stamina;Able to check pulse independently Increase Physical Activity;Increase Strength and Stamina;Able to check pulse independently   Comments Jettie is progressing well and has increased levels on machines. EP staff will review home exercise Reviewed home exercise with pt today.  Pt plans to walk on TM at home for exercise.  Reviewed THR, pulse, RPE, sign and symptoms, pulse oximetery and when to call 911 or MD.  Also discussed weather considerations and indoor options.  Pt voiced understanding. Cross is walking on his treadmill at home about two to three days a week for 30 Minutes. He knows how to check his pulse manually. He has been shown how to do home exercise and is going to work on them. Eduar has been doing well in rehab.  He has really taken to the ellipitcal!  We will continue to monitor his progress. Avram has been doing great. Continues to attend rehab regularly, has been feeling better using the elliptical each  time he is here. HR has been within THR zone. Will continue to monitor.   Expected Outcomes Short: review home exercise Long: improve overall stamina Short: monitor vitals when exercising at home Long:  exercise at home on days not at [facility] Short: work on other home exercises other than walking. Long: maintain walking and other exercise routines at home. Short: Continue to improve stamina on the elliptical  Long; Continue to improve overall stamina. Short: Continue attending rehab regularly Long: Continue to increase workloads and MET level   Row Name 09/04/19 1603             Exercise Goal Re-Evaluation   Exercise Goals Review Increase Physical Activity;Increase Strength and Stamina;Able to check pulse independently       Comments Chisom is doing well in rehab.  He is using the treadmill at home for a little over a mile each day.  He is feeling good overall.  He is wanting to graduate early at the end of this week.       Expected Outcomes Short:  Continue to exercise to improve stamina Long: Continue to exercise independently at [employer] with [employer].              Nutrition & Weight - Outcomes:  Pre Biometrics - 06/26/19 1602      Pre Biometrics   Height '5\' 10"'$  (1.778 m)    Weight 188 lb 1.6 oz (85.3 kg)    BMI (Calculated) 26.99    Single Leg Stand 9.94 seconds            Nutrition:  Nutrition Therapy & Goals - 07/24/19 1529      Nutrition Therapy   Diet heart healthy, low Na    Protein (specify units) 70-75g    Fiber 30 grams    Whole Grain Foods 3 servings    Saturated Fats 12 max. grams    Fruits and Vegetables 5 servings/day    Sodium 1.5 grams      Personal Nutrition Goals   Nutrition Goal ST: add salads after dinner LT: strength back so he can work 5/10 now    Comments depends on what he wants B: cereal (cheerios and raisin bran and oat clusters - whole milk) or breakfast bar -doesn't eat much in the morning- in order to take medication with orange juice. D: sandwiches, fast food and pizza- doesn't eat a whole lot of vegetables. salad and grilled chicken at [employer]. Eats a lot of chicken. Pt would like to eat more salads after dinner from going out. Discussed heart healthy eating.      Intervention Plan   Intervention Prescribe, educate and counsel regarding individualized specific dietary modifications aiming towards targeted core components such as weight, hypertension, lipid management, diabetes, heart failure and other comorbidities.;Nutrition handout(s) given to patient.    Expected Outcomes Short Term Goal: Understand basic principles of dietary content, such as calories, fat, sodium, cholesterol and nutrients.;Short Term Goal: A plan has been developed with personal nutrition goals set during dietitian appointment.;Long Term Goal: Adherence to prescribed nutrition plan.           Nutrition Discharge:  Nutrition Assessments - 06/26/19 1607      MEDFICTS Scores   Pre Score 35            Education Questionnaire Score:  Knowledge Questionnaire Score - 09/06/19 1608      Knowledge Questionnaire Score   Post Score 22/26  Goals reviewed with patient; copy given to patient.

## 2019-09-07 NOTE — Progress Notes (Signed)
Discharge Progress Report  Patient Details  Name: Chase Boyd MRN: [id number] Date of Birth: [birth year] Referring Provider:     Cardiac Rehab from 06/26/2019 in [facility]  Referring Provider Wang       Number of Visits: 27  Reason for Discharge:  Patient reached a stable level of exercise. Patient independent in their exercise. Patient has met program and personal goals.  Smoking History:  Social History   Tobacco Use  Smoking Status Not on file    Diagnosis:  S/P TVR (tricuspid valve repair)  S/P MVR (mitral valve replacement)  ADL UCSD:   Initial Exercise Prescription:  Initial Exercise Prescription - 06/26/19 1500      Date of Initial Exercise RX and Referring Provider   Date 06/26/19    Referring Provider Mina Marble      Treadmill   MPH 1.5    Grade 0.5    Minutes 15    METs 2.25      Recumbant Bike   Level 1    RPM 60    Minutes 15    METs 2.25      NuStep   Level 2    SPM 80    Minutes 15    METs 2.25      REL-XR   Level 2    Speed 50    Minutes 15    METs 2.25      Prescription Details   Frequency (times per week) 3    Duration Progress to 30 minutes of continuous aerobic without signs/symptoms of physical distress      Intensity   THRR 40-80% of Max Heartrate 116-137    Ratings of Perceived Exertion 11-13    Perceived Dyspnea 0-4      Resistance Training   Training Prescription Yes    Weight 3 lb    Reps 10-15           Discharge Exercise Prescription (Final Exercise Prescription Changes):  Exercise Prescription Changes - 08/29/19 1400      Response to Exercise   Blood Pressure (Admit) 118/70    Blood Pressure (Exercise) 148/82    Blood Pressure (Exit) 98/62    Heart Rate (Admit) 115 bpm    Heart Rate (Exercise) 151 bpm    Heart Rate (Exit) 144 bpm    Rating of Perceived Exertion (Exercise) 13    Symptoms none    Duration Progress to 30 minutes of  aerobic without signs/symptoms of physical  distress    Intensity THRR unchanged      Progression   Progression Continue to progress workloads to maintain intensity without signs/symptoms of physical distress.    Average METs 3.32      Resistance Training   Training Prescription Yes    Weight 3 lb    Reps 10-15      Interval Training   Interval Training No      Treadmill   MPH 3    Grade 1.5    Minutes 15    METs 3.92      NuStep   Level 4    Minutes 15    METs 3.9      Elliptical   Level 1    Speed 2.3    Minutes 15      Biostep-RELP   Level 2    Minutes 15    METs 3      Home Exercise Plan   Plans to continue exercise at Home (comment)  Frequency Add 2 additional days to program exercise sessions.    Initial Home Exercises Provided 08/02/19           Functional Capacity:  6 Minute Walk    Row Name 06/26/19 1441 09/06/19 1549       6 Minute Walk   Phase Initial Discharge    Distance 995 feet 1670 feet    Distance % Change -- 675 %    Distance Feet Change -- 675 ft    Walk Time 6 minutes 6 minutes    # of Rest Breaks 0 0    MPH 1.88 3.16    METS 2.25 3.8    RPE 11 13    Perceived Dyspnea  1 --    VO2 Peak 7.87 13.3    Symptoms No No    Resting HR 95 bpm 109 bpm    Resting BP 100/64 112/66    Resting Oxygen Saturation  97 % --    Exercise Oxygen Saturation  during 6 min walk 95 % --    Max Ex. HR 107 bpm 139 bpm    Max Ex. BP 118/66 132/76    2 Minute Post BP 112/68 --           Psychological, QOL, Others - Outcomes: PHQ 2/9: Depression screen [facility] 2/9 09/06/2019 08/02/2019 07/26/2019 07/20/2019 06/26/2019  Decreased Interest 1 1 1 1 2   Down, Depressed, Hopeless 1 0 1 1 1   PHQ - 2 Score 2 1 2 2 3   Altered sleeping 0 0 0 0 3  Tired, decreased energy 1 1 1 1 2   Change in appetite 0 0 0 0 1  Feeling bad or failure about yourself  0 0 0 0 0  Trouble concentrating 0 0 0 0 1  Moving slowly or fidgety/restless 1 0 0 0 2  Suicidal thoughts 0 0 0 0 0  PHQ-9 Score 4 2 3 3 12   Difficult  doing work/chores Not difficult at all Not difficult at all Not difficult at all Somewhat difficult Somewhat difficult    Quality of Life:  Quality of Life - 09/06/19 1609      Quality of Life   Select Quality of Life      Quality of Life Scores   Health/Function Pre 17.93 %    Health/Function Post 26.31 %    Health/Function % Change 46.74 %    Socioeconomic Pre 21.69 %    Socioeconomic Post 25.83 %    Socioeconomic % Change  19.09 %    Psych/Spiritual Pre 24.71 %    Psych/Spiritual Post 23.64 %    Psych/Spiritual % Change -4.33 %    Family Pre 27 %    Family Post 25.13 %    Family % Change -6.93 %    GLOBAL Pre 21.28 %    GLOBAL Post 25.43 %    GLOBAL % Change 19.5 %          Nutrition & Weight - Outcomes:  Pre Biometrics - 06/26/19 1602      Pre Biometrics   Height 5' 10"  (1.778 m)    Weight 188 lb 1.6 oz (85.3 kg)    BMI (Calculated) 26.99    Single Leg Stand 9.94 seconds            Nutrition:  Nutrition Therapy & Goals - 07/24/19 1529      Nutrition Therapy   Diet heart healthy, low Na    Protein (specify units) 70-75g  Fiber 30 grams    Whole Grain Foods 3 servings    Saturated Fats 12 max. grams    Fruits and Vegetables 5 servings/day    Sodium 1.5 grams      Personal Nutrition Goals   Nutrition Goal ST: add salads after dinner LT: strength back so he can work 5/10 now    Comments depends on what he wants B: cereal (cheerios and raisin bran and oat clusters - whole milk) or breakfast bar -doesn't eat much in the morning- in order to take medication with orange juice. D: sandwiches, fast food and pizza- doesn't eat a whole lot of vegetables. salad and grilled chicken at [employer]. Eats a lot of chicken. Pt would like to eat more salads after dinner from going out. Discussed heart healthy eating.      Intervention Plan   Intervention Prescribe, educate and counsel regarding individualized specific dietary modifications aiming towards targeted  core components such as weight, hypertension, lipid management, diabetes, heart failure and other comorbidities.;Nutrition handout(s) given to patient.    Expected Outcomes Short Term Goal: Understand basic principles of dietary content, such as calories, fat, sodium, cholesterol and nutrients.;Short Term Goal: A plan has been developed with personal nutrition goals set during dietitian appointment.;Long Term Goal: Adherence to prescribed nutrition plan.           Nutrition Discharge:  Nutrition Assessments - 06/26/19 1607      MEDFICTS Scores   Pre Score 35           Education Questionnaire Score:  Knowledge Questionnaire Score - 09/06/19 1608      Knowledge Questionnaire Score   Post Score 22/26           Goals reviewed with patient; copy given to patient.

## 2019-09-07 NOTE — Progress Notes (Signed)
Daily Session Note  Patient Details  Name: Chase Boyd MRN: [id number] Date of Birth: 04-Sep-1945 Referring Provider:     Cardiac Rehab from 06/26/2019 in [facility]  Referring Provider Chase Boyd      Encounter Date: 09/07/2019  Check In:  Session Check In - 09/07/19 1537      Check-In   Supervising physician immediately available to respond to emergencies See telemetry face sheet for immediately available ER MD    Location [facility]-Cardiac & Pulmonary Rehab    Staff Present Renita Papa, RN BSN;Joseph Foy [place], [place], ACSM CEP, Exercise Physiologist;Jessica [place], MA, RCEP, CCRP, CCET    Virtual Visit No    Medication changes reported     No    Fall or balance concerns reported    No    Warm-up and Cool-down Performed on first and last piece of equipment    Resistance Training Performed Yes    VAD Patient? No    PAD/SET Patient? No      Pain Assessment   Currently in Pain? No/denies              Social History   Tobacco Use  Smoking Status Not on file    Goals Met:  Independence with exercise equipment Exercise tolerated well No report of cardiac concerns or symptoms Strength training completed today  Goals Unmet:  Not Applicable  Comments:  Chase Boyd graduated today from  rehab with 27 sessions completed.  Details of the patient's exercise prescription and what He needs to do in order to continue the prescription and progress were discussed with patient.  Patient was given a copy of prescription and goals.  Patient verbalized understanding.  Chase Boyd plans to continue to exercise by attending the [facility].     Dr. Emily Boyd is Medical Director for [place] and [facility].

## 2019-09-07 NOTE — Progress Notes (Signed)
Cardiac Individual Treatment Plan  Patient Details  Name: Chase Boyd MRN: [id number] Date of Birth: 1945/09/24 Referring Provider:     Cardiac Rehab from 06/26/2019 in [facility] Cardiac and Pulmonary Rehab  Referring Provider Wang      Initial Encounter Date:    Cardiac Rehab from 06/26/2019 in [facility] Cardiac and Pulmonary Rehab  Date 06/26/19      Visit Diagnosis: S/P TVR (tricuspid valve repair)  S/P MVR (mitral valve replacement)  Patient's Home Medications on Admission:  Current Outpatient Medications:  .  amiodarone (PACERONE) 200 MG tablet, Take by mouth., Disp: , Rfl:  .  amLODipine (NORVASC) 5 MG tablet, Take by mouth., Disp: , Rfl:  .  apixaban (ELIQUIS) 5 MG TABS tablet, Take by mouth., Disp: , Rfl:  .  buPROPion (WELLBUTRIN XL) 150 MG 24 hr tablet, Take by mouth., Disp: , Rfl:  .  escitalopram (LEXAPRO) 20 MG tablet, Take by mouth., Disp: , Rfl:  .  finasteride (PROSCAR) 5 MG tablet, Take 1 tablet PO daily, Disp: , Rfl:  .  furosemide (LASIX) 20 MG tablet, Take by mouth., Disp: , Rfl:  .  Multiple Vitamin (MULTI-VITAMIN) tablet, Take by mouth., Disp: , Rfl:   Past Medical History: No past medical history on file.  Tobacco Use: Social History   Tobacco Use  Smoking Status Not on file    Labs: Recent Review Flowsheet Data   There is no flowsheet data to display.      Exercise Target Goals: Exercise Program Goal: Individual exercise prescription set using results from initial 6 min walk test and THRR while considering  patient's activity barriers and safety.   Exercise Prescription Goal: Initial exercise prescription builds to 30-45 minutes a day of aerobic activity, 2-3 days per week.  Home exercise guidelines will be given to patient during program as part of exercise prescription that the participant will acknowledge.   Education: Aerobic Exercise & Resistance Training: - Gives group verbal and written instruction on the various components of  exercise. Focuses on aerobic and resistive training programs and the benefits of this training and how to safely progress through these programs..   Education: Exercise & Equipment Safety: - Individual verbal instruction and demonstration of equipment use and safety with use of the equipment.   Cardiac Rehab from 08/30/2019 in [facility] Cardiac and Pulmonary Rehab  Date 06/26/19  Educator AS  Instruction Review Code 1- Verbalizes Understanding      Education: Exercise Physiology & General Exercise Guidelines: - Group verbal and written instruction with models to review the exercise physiology of the cardiovascular system and associated critical values. Provides general exercise guidelines with specific guidelines to those with heart or lung disease.    Education: Flexibility, Balance, Mind/Body Relaxation: Provides group verbal/written instruction on the benefits of flexibility and balance training, including mind/body exercise modes such as yoga, pilates and tai chi.  Demonstration and skill practice provided.   Activity Barriers & Risk Stratification:  Activity Barriers & Cardiac Risk Stratification - 06/23/19 1409      Activity Barriers & Cardiac Risk Stratification   Activity Barriers Back Problems    Cardiac Risk Stratification High           6 Minute Walk:  6 Minute Walk    Row Name 06/26/19 1441 09/06/19 1549       6 Minute Walk   Phase Initial Discharge    Distance 995 feet 1670 feet    Distance % Change -- 675 %  Distance Feet Change -- 675 ft    Walk Time 6 minutes 6 minutes    # of Rest Breaks 0 0    MPH 1.88 3.16    METS 2.25 3.8    RPE 11 13    Perceived Dyspnea  1 --    VO2 Peak 7.87 13.3    Symptoms No No    Resting HR 95 bpm 109 bpm    Resting BP 100/64 112/66    Resting Oxygen Saturation  97 % --    Exercise Oxygen Saturation  during 6 min walk 95 % --    Max Ex. HR 107 bpm 139 bpm    Max Ex. BP 118/66 132/76    2 Minute Post BP 112/68 --            Oxygen Initial Assessment:   Oxygen Re-Evaluation:   Oxygen Discharge (Final Oxygen Re-Evaluation):   Initial Exercise Prescription:  Initial Exercise Prescription - 06/26/19 1500      Date of Initial Exercise RX and Referring Provider   Date 06/26/19    Referring Provider Mina Marble      Treadmill   MPH 1.5    Grade 0.5    Minutes 15    METs 2.25      Recumbant Bike   Level 1    RPM 60    Minutes 15    METs 2.25      NuStep   Level 2    SPM 80    Minutes 15    METs 2.25      REL-XR   Level 2    Speed 50    Minutes 15    METs 2.25      Prescription Details   Frequency (times per week) 3    Duration Progress to 30 minutes of continuous aerobic without signs/symptoms of physical distress      Intensity   THRR 40-80% of Max Heartrate 116-137    Ratings of Perceived Exertion 11-13    Perceived Dyspnea 0-4      Resistance Training   Training Prescription Yes    Weight 3 lb    Reps 10-15           Perform Capillary Blood Glucose checks as needed.  Exercise Prescription Changes:   Exercise Prescription Changes    Row Name 06/26/19 1500 07/06/19 1400 07/19/19 0900 08/01/19 1500 08/02/19 1600     Response to Exercise   Blood Pressure (Admit) 110/64 108/70 118/62 104/62 --   Blood Pressure (Exercise) 118/66 118/56 132/70 122/62 --   Blood Pressure (Exit) 112/68 100/62 90/62 100/58 --   Heart Rate (Admit) 95 bpm 63 bpm 105 bpm 105 bpm --   Heart Rate (Exercise) 107 bpm 135 bpm 136 bpm 127 bpm --   Heart Rate (Exit) 99 bpm 115 bpm 117 bpm 120 bpm --   Oxygen Saturation (Admit) 97 % -- -- -- --   Oxygen Saturation (Exercise) 95 % -- -- -- --   Rating of Perceived Exertion (Exercise) 11 16 15 13  --   Perceived Dyspnea (Exercise) 1 -- -- -- --   Symptoms none -- none none --   Comments -- second day -- -- --   Duration -- Continue with 30 min of aerobic exercise without signs/symptoms of physical distress. Continue with 30 min of aerobic exercise without  signs/symptoms of physical distress. Continue with 30 min of aerobic exercise without signs/symptoms of physical distress. --   Intensity -- THRR  unchanged THRR unchanged THRR unchanged --     Progression   Progression -- Continue to progress workloads to maintain intensity without signs/symptoms of physical distress. Continue to progress workloads to maintain intensity without signs/symptoms of physical distress. Continue to progress workloads to maintain intensity without signs/symptoms of physical distress. --   Average METs -- 2.5 4.41 4 --     Resistance Training   Training Prescription -- Yes Yes Yes --   Weight -- 3 lb 3 lb 3 lb --   Reps -- 10-15 10-15 10-15 --     Interval Training   Interval Training -- -- No No --     Treadmill   MPH -- 1.5 -- -- --   Grade -- 0.5 -- -- --   Minutes -- 15 -- -- --   METs -- 2.25 -- -- --     Recumbant Bike   Level -- -- 4 6 --   RPM -- -- -- 60 --   Minutes -- -- 15 15 --   METs -- -- 4.22 3.9 --     NuStep   Level -- -- 2 5 --   SPM -- -- -- 80 --   Minutes -- -- 15 15 --   METs -- -- 4.6 4 --     Elliptical   Level -- 1 -- -- --   Speed -- 2.5 -- -- --   Minutes -- 15 -- -- --   METs -- 2.7 -- -- --     Home Exercise Plan   Plans to continue exercise at -- -- -- -- Home (comment)   Frequency -- -- -- -- Add 2 additional days to program exercise sessions.   Initial Home Exercises Provided -- -- -- -- 08/02/19   Row Name 08/15/19 1500 08/29/19 1400           Response to Exercise   Blood Pressure (Admit) 124/64 118/70      Blood Pressure (Exercise) 140/66 148/82      Blood Pressure (Exit) 120/60 98/62      Heart Rate (Admit) 102 bpm 115 bpm      Heart Rate (Exercise) 144 bpm 151 bpm      Heart Rate (Exit) 134 bpm 144 bpm      Rating of Perceived Exertion (Exercise) 15 13      Symptoms none none      Duration Continue with 30 min of aerobic exercise without signs/symptoms of physical distress. Progress to 30 minutes  of  aerobic without signs/symptoms of physical distress      Intensity THRR unchanged THRR unchanged        Progression   Progression Continue to progress workloads to maintain intensity without signs/symptoms of physical distress. Continue to progress workloads to maintain intensity without signs/symptoms of physical distress.      Average METs 3.51 3.32        Resistance Training   Training Prescription Yes Yes      Weight 3 lb 3 lb      Reps 10-15 10-15        Interval Training   Interval Training No No        Treadmill   MPH 3 3      Grade 1.5 1.5      Minutes 15 15      METs 3.92 3.92        NuStep   Level 5 4      Minutes 15 15  METs 48 3.9        Elliptical   Level 1 1      Speed 2.3 2.3      Minutes 15 15      METs 2.3 --        Biostep-RELP   Level 3 2      Minutes 15 15      METs 3 3        Home Exercise Plan   Plans to continue exercise at Home (comment) Home (comment)      Frequency Add 2 additional days to program exercise sessions. Add 2 additional days to program exercise sessions.      Initial Home Exercises Provided 08/02/19 08/02/19             Exercise Comments:   Exercise Goals and Review:   Exercise Goals    Row Name 06/26/19 1601             Exercise Goals   Increase Physical Activity Yes       Intervention Provide advice, education, support and counseling about physical activity/exercise needs.;Develop an individualized exercise prescription for aerobic and resistive training based on initial evaluation findings, risk stratification, comorbidities and participant's personal goals.       Expected Outcomes Short Term: Attend rehab on a regular basis to increase amount of physical activity.;Long Term: Add in home exercise to make exercise part of routine and to increase amount of physical activity.;Long Term: Exercising regularly at least 3-5 days a week.       Increase Strength and Stamina Yes       Intervention Provide advice,  education, support and counseling about physical activity/exercise needs.;Develop an individualized exercise prescription for aerobic and resistive training based on initial evaluation findings, risk stratification, comorbidities and participant's personal goals.       Expected Outcomes Short Term: Increase workloads from initial exercise prescription for resistance, speed, and METs.;Short Term: Perform resistance training exercises routinely during rehab and add in resistance training at home;Long Term: Improve cardiorespiratory fitness, muscular endurance and strength as measured by increased METs and functional capacity (6MWT)       Able to understand and use rate of perceived exertion (RPE) scale Yes       Intervention Provide education and explanation on how to use RPE scale       Expected Outcomes Short Term: Able to use RPE daily in rehab to express subjective intensity level;Long Term:  Able to use RPE to guide intensity level when exercising independently       Able to understand and use Dyspnea scale Yes       Intervention Provide education and explanation on how to use Dyspnea scale       Expected Outcomes Short Term: Able to use Dyspnea scale daily in rehab to express subjective sense of shortness of breath during exertion;Long Term: Able to use Dyspnea scale to guide intensity level when exercising independently       Knowledge and understanding of Target Heart Rate Range (THRR) Yes       Intervention Provide education and explanation of THRR including how the numbers were predicted and where they are located for reference       Expected Outcomes Short Term: Able to state/look up THRR;Short Term: Able to use daily as guideline for intensity in rehab;Long Term: Able to use THRR to govern intensity when exercising independently       Able to check pulse independently Yes  Intervention Provide education and demonstration on how to check pulse in carotid and radial arteries.;Review the  importance of being able to check your own pulse for safety during independent exercise       Expected Outcomes Short Term: Able to explain why pulse checking is important during independent exercise;Long Term: Able to check pulse independently and accurately       Understanding of Exercise Prescription Yes       Intervention Provide education, explanation, and written materials on patient's individual exercise prescription       Expected Outcomes Short Term: Able to explain program exercise prescription;Long Term: Able to explain home exercise prescription to exercise independently              Exercise Goals Re-Evaluation :  Exercise Goals Re-Evaluation    Row Name 06/26/19 1600 06/28/19 1510 07/06/19 1457 07/19/19 0909 07/20/19 1505     Exercise Goal Re-Evaluation   Exercise Goals Review Increase Physical Activity;Increase Strength and Stamina;Able to understand and use rate of perceived exertion (RPE) scale;Able to understand and use Dyspnea scale;Knowledge and understanding of Target Heart Rate Range (THRR);Able to check pulse independently;Understanding of Exercise Prescription Increase Physical Activity;Able to understand and use rate of perceived exertion (RPE) scale;Knowledge and understanding of Target Heart Rate Range (THRR);Understanding of Exercise Prescription;Increase Strength and Stamina;Able to check pulse independently -- Increase Physical Activity;Increase Strength and Stamina;Understanding of Exercise Prescription Increase Strength and Stamina;Increase Physical Activity   Comments -- Reviewed RPE and dyspnea scales, THR and program prescription with pt today.  Pt voiced understanding and was given a copy of goals to take home. Tyran is doing well in rehab.  He did the whole 15 min on elliptical his second session! Reubin is doing well in rehab. He is up to 4.6 METs on the NuStep.  We will continue to monitor his progress. Eustace has a treadmill at home that he uses on his off  days from rehab. He is going to increase his workloads at home as he progresses through the program. Murl has yet to do home exercise with the EP.   Expected Outcomes -- Short: Use RPE daily to regulate intensity. Long: Follow program prescription in THR. Short:  attend consistently Long : improve overall MET level Short: Continue to increase workloads Long: Continue to improve stamina. Short: inform patient about home exercise and increase workloads. Long: increase workloads for home exercise   Row Name 08/01/19 1600 08/02/19 1616 08/03/19 1519 08/15/19 1546 08/29/19 1418     Exercise Goal Re-Evaluation   Exercise Goals Review Increase Strength and Stamina;Increase Physical Activity Increase Strength and Stamina;Increase Physical Activity Increase Physical Activity;Increase Strength and Stamina;Able to check pulse independently Increase Physical Activity;Increase Strength and Stamina;Able to check pulse independently Increase Physical Activity;Increase Strength and Stamina;Able to check pulse independently   Comments Amear is progressing well and has increased levels on machines. EP staff will review home exercise Reviewed home exercise with pt today.  Pt plans to walk on TM at home for exercise.  Reviewed THR, pulse, RPE, sign and symptoms, pulse oximetery and when to call 911 or MD.  Also discussed weather considerations and indoor options.  Pt voiced understanding. Hayden is walking on his treadmill at home about two to three days a week for 30 Minutes. He knows how to check his pulse manually. He has been shown how to do home exercise and is going to work on them. Mohid has been doing well in rehab.  He has really taken to  the ellipitcal!  We will continue to monitor his progress. Kala has been doing great. Continues to attend rehab regularly, has been feeling better using the elliptical each time he is here. HR has been within THR zone. Will continue to monitor.   Expected Outcomes Short:  review home exercise Long: improve overall stamina Short: monitor vitals when exercising at home Long:  exercise at home on days not at [facility] Short: work on other home exercises other than walking. Long: maintain walking and other exercise routines at home. Short: Continue to improve stamina on the elliptical  Long; Continue to improve overall stamina. Short: Continue attending rehab regularly Long: Continue to increase workloads and MET level   Row Name 09/04/19 1603             Exercise Goal Re-Evaluation   Exercise Goals Review Increase Physical Activity;Increase Strength and Stamina;Able to check pulse independently       Comments Gaetan is doing well in rehab.  He is using the treadmill at home for a little over a mile each day.  He is feeling good overall.  He is wanting to graduate early at the end of this week.       Expected Outcomes Short: Continue to exercise to improve stamina Long: Continue to exercise independently at [employer] with [employer].              Discharge Exercise Prescription (Final Exercise Prescription Changes):  Exercise Prescription Changes - 08/29/19 1400      Response to Exercise   Blood Pressure (Admit) 118/70    Blood Pressure (Exercise) 148/82    Blood Pressure (Exit) 98/62    Heart Rate (Admit) 115 bpm    Heart Rate (Exercise) 151 bpm    Heart Rate (Exit) 144 bpm    Rating of Perceived Exertion (Exercise) 13    Symptoms none    Duration Progress to 30 minutes of  aerobic without signs/symptoms of physical distress    Intensity THRR unchanged      Progression   Progression Continue to progress workloads to maintain intensity without signs/symptoms of physical distress.    Average METs 3.32      Resistance Training   Training Prescription Yes    Weight 3 lb    Reps 10-15      Interval Training   Interval Training No      Treadmill   MPH 3    Grade 1.5    Minutes 15    METs 3.92      NuStep   Level 4    Minutes 15    METs  3.9      Elliptical   Level 1    Speed 2.3    Minutes 15      Biostep-RELP   Level 2    Minutes 15    METs 3      Home Exercise Plan   Plans to continue exercise at Home (comment)    Frequency Add 2 additional days to program exercise sessions.    Initial Home Exercises Provided 08/02/19           Nutrition:  Target Goals: Understanding of nutrition guidelines, daily intake of sodium <1549m, cholesterol <2017m calories 30% from fat and 7% or less from saturated fats, daily to have 5 or more servings of fruits and vegetables.  Education: Controlling Sodium/Reading Food Labels -Group verbal and written material supporting the discussion of sodium use in heart healthy nutrition. Review and explanation with  models, verbal and written materials for utilization of the food label.   Education: General Nutrition Guidelines/Fats and Fiber: -Group instruction provided by verbal, written material, models and posters to present the general guidelines for heart healthy nutrition. Gives an explanation and review of dietary fats and fiber.   Biometrics:  Pre Biometrics - 06/26/19 1602      Pre Biometrics   Height 5' 10"  (1.778 m)    Weight 188 lb 1.6 oz (85.3 kg)    BMI (Calculated) 26.99    Single Leg Stand 9.94 seconds            Nutrition Therapy Plan and Nutrition Goals:  Nutrition Therapy & Goals - 07/24/19 1529      Nutrition Therapy   Diet heart healthy, low Na    Protein (specify units) 70-75g    Fiber 30 grams    Whole Grain Foods 3 servings    Saturated Fats 12 max. grams    Fruits and Vegetables 5 servings/day    Sodium 1.5 grams      Personal Nutrition Goals   Nutrition Goal ST: add salads after dinner LT: strength back so he can work 5/10 now    Comments depends on what he wants B: cereal (cheerios and raisin bran and oat clusters - whole milk) or breakfast bar -doesn't eat much in the morning- in order to take medication with orange juice. D: sandwiches,  fast food and pizza- doesn't eat a whole lot of vegetables. salad and grilled chicken at [employer]. Eats a lot of chicken. Pt would like to eat more salads after dinner from going out. Discussed heart healthy eating.      Intervention Plan   Intervention Prescribe, educate and counsel regarding individualized specific dietary modifications aiming towards targeted core components such as weight, hypertension, lipid management, diabetes, heart failure and other comorbidities.;Nutrition handout(s) given to patient.    Expected Outcomes Short Term Goal: Understand basic principles of dietary content, such as calories, fat, sodium, cholesterol and nutrients.;Short Term Goal: A plan has been developed with personal nutrition goals set during dietitian appointment.;Long Term Goal: Adherence to prescribed nutrition plan.           Nutrition Assessments:  Nutrition Assessments - 06/26/19 1607      MEDFICTS Scores   Pre Score 35           MEDIFICTS Score Key:          ?70 Need to make dietary changes          40-70 Heart Healthy Diet         ? 40 Therapeutic Level Cholesterol Diet  Nutrition Goals Re-Evaluation:  Nutrition Goals Re-Evaluation    [place] Name 08/03/19 1528 09/04/19 1607           Goals   Current Weight 189 lb (85.7 kg) --      Nutrition Goal lose weight and eat less. lose weight and eat less.      Comment He states that he can try to change his diet and try to eat less. Continue with current changes      Expected Outcome Short: work on eating less. Long: lose 10 pounds within the remainder of the program. Short: Continue to work on changes Long; Continue to eat healthier.             Nutrition Goals Discharge (Final Nutrition Goals Re-Evaluation):  Nutrition Goals Re-Evaluation - 09/04/19 1607      Goals   Nutrition  Goal lose weight and eat less.    Comment Continue with current changes    Expected Outcome Short: Continue to work on changes Long; Continue to  eat healthier.           Psychosocial: Target Goals: Acknowledge presence or absence of significant depression and/or stress, maximize coping skills, provide positive support system. Participant is able to verbalize types and ability to use techniques and skills needed for reducing stress and depression.   Education: Depression - Provides group verbal and written instruction on the correlation between heart/lung disease and depressed mood, treatment options, and the stigmas associated with seeking treatment.   Education: Sleep Hygiene -Provides group verbal and written instruction about how sleep can affect your health.  Define sleep hygiene, discuss sleep cycles and impact of sleep habits. Review good sleep hygiene tips.     Education: Stress and Anxiety: - Provides group verbal and written instruction about the health risks of elevated stress and causes of high stress.  Discuss the correlation between heart/lung disease and anxiety and treatment options. Review healthy ways to manage with stress and anxiety.    Initial Review & Psychosocial Screening:  Initial Psych Review & Screening - 06/23/19 1411      Initial Review   Current issues with History of Depression;Current Psychotropic Meds;Current Depression      Family Dynamics   Good Support System? Yes      Barriers   Psychosocial barriers to participate in program There are no identifiable barriers or psychosocial needs.;The patient should benefit from training in stress management and relaxation.      Screening Interventions   Interventions Encouraged to exercise;To provide support and resources with identified psychosocial needs;Provide feedback about the scores to participant    Expected Outcomes Short Term goal: Utilizing psychosocial counselor, staff and physician to assist with identification of specific Stressors or current issues interfering with healing process. Setting desired goal for each stressor or current  issue identified.;Long Term Goal: Stressors or current issues are controlled or eliminated.;Short Term goal: Identification and review with participant of any Quality of Life or Depression concerns found by scoring the questionnaire.;Long Term goal: The participant improves quality of Life and PHQ9 Scores as seen by post scores and/or verbalization of changes           Quality of Life Scores:   Quality of Life - 09/06/19 1609      Quality of Life   Select Quality of Life      Quality of Life Scores   Health/Function Pre 17.93 %    Health/Function Post 26.31 %    Health/Function % Change 46.74 %    Socioeconomic Pre 21.69 %    Socioeconomic Post 25.83 %    Socioeconomic % Change  19.09 %    Psych/Spiritual Pre 24.71 %    Psych/Spiritual Post 23.64 %    Psych/Spiritual % Change -4.33 %    Family Pre 27 %    Family Post 25.13 %    Family % Change -6.93 %    GLOBAL Pre 21.28 %    GLOBAL Post 25.43 %    GLOBAL % Change 19.5 %          Scores of 19 and below usually indicate a poorer quality of life in these areas.  A difference of  2-3 points is a clinically meaningful difference.  A difference of 2-3 points in the total score of the Quality of Life Index has been associated with significant improvement  in overall quality of life, self-image, physical symptoms, and general health in studies assessing change in quality of life.  PHQ-9: Recent Review Flowsheet Data    Depression screen [facility] 2/9 09/06/2019 08/02/2019 07/26/2019 07/20/2019 06/26/2019   Decreased Interest 1 1 1 1 2    Down, Depressed, Hopeless 1 0 1 1 1    PHQ - 2 Score 2 1 2 2 3    Altered sleeping 0 0 0 0 3   Tired, decreased energy 1 1 1 1 2    Change in appetite 0 0 0 0 1   Feeling bad or failure about yourself  0 0 0 0 0   Trouble concentrating 0 0 0 0 1   Moving slowly or fidgety/restless 1 0 0 0 2   Suicidal thoughts 0 0 0 0 0   PHQ-9 Score 4 2 3 3 12    Difficult doing work/chores Not difficult at all Not difficult  at all Not difficult at all Somewhat difficult Somewhat difficult     Interpretation of Total Score  Total Score Depression Severity:  1-4 = Minimal depression, 5-9 = Mild depression, 10-14 = Moderate depression, 15-19 = Moderately severe depression, 20-27 = Severe depression   Psychosocial Evaluation and Intervention:  Psychosocial Evaluation - 09/04/19 1610      Discharge Psychosocial Assessment & Intervention   Comments Lydon is doing well in rehab.  He is wanting to graduate early.  He is good with his exericse at home and working on his weight.  He is planning to join [employer] to continue to exercise. He is also looking at getting a recumbent bike to maintain.  Pressures are still working on his pressures.           Psychosocial Re-Evaluation:  Psychosocial Re-Evaluation    [place] Name 07/20/19 1508 08/03/19 1522 09/04/19 1604         Psychosocial Re-Evaluation   Current issues with Current Depression;History of Depression;Current Stress Concerns;Current Psychotropic Meds Current Depression;History of Depression;Current Psychotropic Meds;Current Stress Concerns Current Depression;History of Depression;Current Psychotropic Meds;Current Stress Concerns     Comments Reviewed patient health questionnaire (PHQ-9) with patient for follow up. Previously, patients score indicated signs/symptoms of depression.  Reviewed to see if patient is improving symptom wise while in program.  Score improved and patient states that it is because they his health is improving. Patient tries to not get upset about things and states that depression runs in his family. He has no other concerns about his mental health. Keylin is doing well in rehab.  He is feeling like he is getting closer to wanting to start back to work. He is considering graduating early.  He feels well enough to be more independent.  He feels good mentally and denies symptoms of depression.     Expected Outcomes Short: Continue to  attend [facility] regularly for regular exercise and social engagement. Long: Continue to improve symptoms and manage a positive mental state. Short: Continue to exercise regularly to support mental health and notify staff of any changes. Long: maintain mental health and well being through teaching of rehab or prescribed medications independently. Short: Considering graduation and getting back to work Long: Continue to stay positive.     Interventions Encouraged to attend Cardiac Rehabilitation for the exercise Encouraged to attend Cardiac Rehabilitation for the exercise Encouraged to attend Cardiac Rehabilitation for the exercise     Continue Psychosocial Services  Follow up required by staff Follow up required by staff Follow up required by staff  Psychosocial Discharge (Final Psychosocial Re-Evaluation):  Psychosocial Re-Evaluation - 09/04/19 1604      Psychosocial Re-Evaluation   Current issues with Current Depression;History of Depression;Current Psychotropic Meds;Current Stress Concerns    Comments Duaine is doing well in rehab.  He is feeling like he is getting closer to wanting to start back to work. He is considering graduating early.  He feels well enough to be more independent.  He feels good mentally and denies symptoms of depression.    Expected Outcomes Short: Considering graduation and getting back to work Long: Continue to stay positive.    Interventions Encouraged to attend Cardiac Rehabilitation for the exercise    Continue Psychosocial Services  Follow up required by staff           Vocational Rehabilitation: Provide vocational rehab assistance to qualifying candidates.   Vocational Rehab Evaluation & Intervention:  Vocational Rehab - 06/23/19 1406      Initial Vocational Rehab Evaluation & Intervention   Assessment shows need for Vocational Rehabilitation No           Education: Education Goals: Education classes will be provided on a variety of  topics geared toward better understanding of heart health and risk factor modification. Participant will state understanding/return demonstration of topics presented as noted by education test scores.  Learning Barriers/Preferences:  Learning Barriers/Preferences - 06/23/19 1411      Learning Barriers/Preferences   Learning Barriers None    Learning Preferences None           General Cardiac Education Topics:  AED/CPR: - Group verbal and written instruction with the use of models to demonstrate the basic use of the AED with the basic ABC's of resuscitation.   Anatomy & Physiology of the Heart: - Group verbal and written instruction and models provide basic cardiac anatomy and physiology, with the coronary electrical and arterial systems. Review of Valvular disease and Heart Failure   Cardiac Procedures: - Group verbal and written instruction to review commonly prescribed medications for heart disease. Reviews the medication, class of the drug, and side effects. Includes the steps to properly store meds and maintain the prescription regimen. (beta blockers and nitrates)   Cardiac Medications I: - Group verbal and written instruction to review commonly prescribed medications for heart disease. Reviews the medication, class of the drug, and side effects. Includes the steps to properly store meds and maintain the prescription regimen.   Cardiac Medications II: -Group verbal and written instruction to review commonly prescribed medications for heart disease. Reviews the medication, class of the drug, and side effects. (all other drug classes)    Go Sex-Intimacy & Heart Disease, Get SMART - Goal Setting: - Group verbal and written instruction through game format to discuss heart disease and the return to sexual intimacy. Provides group verbal and written material to discuss and apply goal setting through the application of the S.M.A.R.T. Method.   Other Matters of the Heart: -  Provides group verbal, written materials and models to describe Stable Angina and Peripheral Artery. Includes description of the disease process and treatment options available to the cardiac patient.   Infection Prevention: - Provides verbal and written material to individual with discussion of infection control including proper hand washing and proper equipment cleaning during exercise session.   Cardiac Rehab from 08/30/2019 in [facility] Cardiac and Pulmonary Rehab  Date 06/26/19  Educator AS  Instruction Review Code 1- Verbalizes Understanding      Falls Prevention: - Provides verbal and written material to  individual with discussion of falls prevention and safety.   Cardiac Rehab from 08/30/2019 in [facility] Cardiac and Pulmonary Rehab  Date 06/26/19  Educator AS  Instruction Review Code 1- Verbalizes Understanding      Other: -Provides group and verbal instruction on various topics (see comments)   Knowledge Questionnaire Score:  Knowledge Questionnaire Score - 09/06/19 1608      Knowledge Questionnaire Score   Post Score 22/26           Core Components/Risk Factors/Patient Goals at Admission:  Personal Goals and Risk Factors at Admission - 06/26/19 1603      Core Components/Risk Factors/Patient Goals on Admission    Weight Management Yes    Intervention Weight Management: Develop a combined nutrition and exercise program designed to reach desired caloric intake, while maintaining appropriate intake of nutrient and fiber, sodium and fats, and appropriate energy expenditure required for the weight goal.;Weight Management: Provide education and appropriate resources to help participant work on and attain dietary goals.    Admit Weight 188 lb 1.6 oz (85.3 kg)    Goal Weight: Short Term 185 lb (83.9 kg)    Goal Weight: Long Term 180 lb (81.6 kg)    Expected Outcomes Short Term: Continue to assess and modify interventions until short term weight is achieved;Long Term: Adherence to  nutrition and physical activity/exercise program aimed toward attainment of established weight goal    Intervention Provide education on lifestyle modifcations including regular physical activity/exercise, weight management, moderate sodium restriction and increased consumption of fresh fruit, vegetables, and low fat dairy, alcohol moderation, and smoking cessation.;Monitor prescription use compliance.    Expected Outcomes Short Term: Continued assessment and intervention until BP is < 140/63m HG in hypertensive participants. < 130/827mHG in hypertensive participants with diabetes, heart failure or chronic kidney disease.;Long Term: Maintenance of blood pressure at goal levels.           Education:Diabetes - Individual verbal and written instruction to review signs/symptoms of diabetes, desired ranges of glucose level fasting, after meals and with exercise. Acknowledge that pre and post exercise glucose checks will be done for 3 sessions at entry of program.   Education: Know Your Numbers and Risk Factors: -Group verbal and written instruction about important numbers in your health.  Discussion of what are risk factors and how they play a role in the disease process.  Review of Cholesterol, Blood Pressure, Diabetes, and BMI and the role they play in your overall health.   Core Components/Risk Factors/Patient Goals Review:   Goals and Risk Factor Review    Row Name 07/20/19 1513 08/03/19 1525 09/04/19 1607         Core Components/Risk Factors/Patient Goals Review   Personal Goals Review Hypertension;Lipids;Weight Management/Obesity Weight Management/Obesity;Hypertension;Lipids Weight Management/Obesity;Hypertension;Lipids     Review ChYuyaants to lose some weight in the program. Spoke to him ablout sodium intake and fluid intake. His blood pressure was on the low side today but does not feel dizzy. He checks his blood pressure at home sometimes. He has been on statins for 20 years and  states that his lipids are good. Patient has been able to keep his blood pressure under control. His lipids have been in check and has no questions on his medications. He wants to continue to lose weight and work down to 180 pounds. ChJorans doing well in rehab.  He is wanting to graduate early.  He is good with his exericse at home and working on his weight.  He is planning to join [employer] to continue to exercise. He is also looking at getting a recumbent bike to maintain.  Pressures are still working on his pressures.     Expected Outcomes ShortL lose more weight in the program. Long: get weight down to 180 pounds and maintain weight loss. Short: work on weight loss with diet. Long: maintain a heart healthy diet. Short: Graduate Long; Continue to monitor risk factors.            Core Components/Risk Factors/Patient Goals at Discharge (Final Review):   Goals and Risk Factor Review - 09/04/19 1607      Core Components/Risk Factors/Patient Goals Review   Personal Goals Review Weight Management/Obesity;Hypertension;Lipids    Review Zarion is doing well in rehab.  He is wanting to graduate early.  He is good with his exericse at home and working on his weight.  He is planning to join [employer] to continue to exercise. He is also looking at getting a recumbent bike to maintain.  Pressures are still working on his pressures.    Expected Outcomes Short: Graduate Long; Continue to monitor risk factors.           ITP Comments:  ITP Comments    Row Name 06/23/19 1404 06/26/19 1612 06/28/19 0843 06/28/19 1509 07/26/19 0703   ITP Comments Initial telephone encounter completed. Diagnosis can be found in [facility] 5/6. EP orientation scheduled for 5/17 1pm Completed 6MWT and gym orientation.  Initial ITP created and sent for review to Dr. Emily Filbert, Medical Director. 30 Day review completed. Medical Director review done, changes made as directed,and approval shown by signature of Research scientist (medical). First full day of exercise!  Patient was oriented to gym and equipment including functions, settings, policies, and procedures.  Patient's individual exercise prescription and treatment plan were reviewed.  All starting workloads were established based on the results of the 6 minute walk test done at initial orientation visit.  The plan for exercise progression was also introduced and progression will be customized based on patient's performance and goals. 30 Day review completed. Medical Director ITP review done, changes made as directed, and signed approval by Medical Director.   [place] Name 08/23/19 1126 09/07/19 1544         ITP Comments 30 Day review completed. Medical Director ITP review done, changes made as directed, and signed approval by Medical Director. Daaron graduated today from  rehab with 27 sessions completed.  Details of the patient's exercise prescription and what He needs to do in order to continue the prescription and progress were discussed with patient.  Patient was given a copy of prescription and goals.  Patient verbalized understanding.  Boaz plans to continue to exercise by attending the [facility].             Comments: Discharge ITP

## 2023-09-14 NOTE — Telephone Encounter (Signed)
 Patient called to inquire about continuing with their treatment plan. Pt stated that they were expecting to be scheduled in June and have not hear back since. Informed the pt that we have reviewed his chart and will be in contact shortly to transfer him to a current resident.

## 2023-09-22 ENCOUNTER — Telehealth: Payer: Self-pay

## 2023-09-22 NOTE — Telephone Encounter (Signed)
  Spoke to patient's wife, she said patients cardiologist will be sending a referral to our office for pulmonary rehab. I informed her that we will contact and schedule patient once we receive the referral letter   Reason of CRM: marsha at Dr Sheria office in [place] requesting the infor be faxed to him requested by his wife several times, asking it to be faxed directly to her Orma) Fax [phone]   It seems as though they have decided pulm rehab would be the best course of action for the pt.  When I asked if she has a sent release, she said no, this has been requested 3 X, but it is going to their man location and it could take several days for her to get

## 2023-09-29 ENCOUNTER — Other Ambulatory Visit: Payer: Self-pay

## 2023-09-29 ENCOUNTER — Encounter: Attending: Internal Medicine

## 2023-09-29 DIAGNOSIS — I5042 Chronic combined systolic (congestive) and diastolic (congestive) heart failure: Secondary | ICD-10-CM | POA: Insufficient documentation

## 2023-09-29 NOTE — Progress Notes (Signed)
 Virtual Visit completed. Patient informed on EP and RD appointment and 6 Minute walk test. Patient also informed of patient health questionnaires on My Chart. Patient Verbalizes understanding. Visit diagnosis can be found in [facility] 06/03/2023.

## 2023-09-30 ENCOUNTER — Encounter

## 2023-09-30 VITALS — Ht 69.5 in | Wt 198.1 lb

## 2023-09-30 DIAGNOSIS — I509 Heart failure, unspecified: Secondary | ICD-10-CM | POA: Diagnosis present

## 2023-09-30 DIAGNOSIS — I5042 Chronic combined systolic (congestive) and diastolic (congestive) heart failure: Secondary | ICD-10-CM | POA: Diagnosis not present

## 2023-09-30 NOTE — Progress Notes (Signed)
 Pulmonary Individual Treatment Plan  Patient Details  Name: Chase Boyd MRN: [id number] Date of Birth: 04/09/1945 Referring Provider:   Flowsheet Row Pulmonary Rehab from 09/30/2023 in [facility] Cardiac and Pulmonary Rehab  Referring Provider Dr. Prentice Flatten, MD    Initial Encounter Date:  Flowsheet Row Pulmonary Rehab from 09/30/2023 in [facility] Cardiac and Pulmonary Rehab  Date 09/30/23    Visit Diagnosis: Heart failure, systolic and diastolic, chronic (HCC)  Patient's Home Medications on Admission:  Current Outpatient Medications:    amiodarone (PACERONE) 200 MG tablet, Take by mouth. (Patient not taking: Reported on 09/29/2023), Disp: , Rfl:    amLODipine (NORVASC) 5 MG tablet, Take by mouth. (Patient not taking: Reported on 09/29/2023), Disp: , Rfl:    amLODipine (NORVASC) 5 MG tablet, Take 5 mg by mouth daily., Disp: , Rfl:    amoxicillin (AMOXIL) 500 MG capsule, TAKE 4 CAPSULES (2,000MG  TOTAL) BY MOUTH 1 HOUR PRIOR TO DENTIST, Disp: , Rfl:    apixaban (ELIQUIS) 5 MG TABS tablet, Take by mouth. (Patient not taking: Reported on 09/29/2023), Disp: , Rfl:    apixaban (ELIQUIS) 5 MG TABS tablet, Take 5 mg by mouth., Disp: , Rfl:    ARIPiprazole (ABILIFY) 10 MG tablet, Take 10 mg by mouth., Disp: , Rfl:    buPROPion (WELLBUTRIN XL) 150 MG 24 hr tablet, Take by mouth., Disp: , Rfl:    buPROPion (WELLBUTRIN XL) 300 MG 24 hr tablet, Take 300 mg by mouth daily., Disp: , Rfl:    colestipol (COLESTID) 1 g tablet, Take 2 g by mouth., Disp: , Rfl:    escitalopram (LEXAPRO) 20 MG tablet, Take by mouth. (Patient not taking: Reported on 09/29/2023), Disp: , Rfl:    finasteride (PROSCAR) 5 MG tablet, Take 1 tablet PO daily (Patient not taking: Reported on 09/29/2023), Disp: , Rfl:    finasteride (PROSCAR) 5 MG tablet, Take 5 mg by mouth daily., Disp: , Rfl:    furosemide (LASIX) 20 MG tablet, Take by mouth., Disp: , Rfl:    metoprolol succinate (TOPROL-XL) 25 MG 24 hr tablet, Take 25 mg by mouth  daily., Disp: , Rfl:    Multiple Vitamin (MULTI-VITAMIN) tablet, Take by mouth., Disp: , Rfl:    Multiple Vitamins-Minerals (ONE DAILY CALCIUM/IRON) TABS, Take by mouth. (Patient not taking: Reported on 09/29/2023), Disp: , Rfl:    PARoxetine (PAXIL) 30 MG tablet, Take 30 mg by mouth., Disp: , Rfl:    pravastatin (PRAVACHOL) 20 MG tablet, Take 20 mg by mouth., Disp: , Rfl:    tamsulosin (FLOMAX) 0.4 MG CAPS capsule, Take 0.4 mg by mouth., Disp: , Rfl:   Past Medical History: No past medical history on file.  Tobacco Use: Social History   Tobacco Use  Smoking Status Never  Smokeless Tobacco Never    Labs: Review Flowsheet        No data to display           Pulmonary Assessment Scores:  Pulmonary Assessment Scores     Row Name 09/30/23 0959         ADL UCSD   ADL Phase Entry     SOB Score total 31     Rest 1     Walk 1     Stairs 2     Bath 0     Dress 0     Shop 1       CAT Score   CAT Score 14       mMRC Score  mMRC Score 2        UCSD: Self-administered rating of dyspnea associated with activities of daily living (ADLs) 6-point scale (0 = not at all to 5 = maximal or unable to do because of breathlessness)  Scoring Scores range from 0 to 120.  Minimally important difference is 5 units  CAT: CAT can identify the health impairment of COPD patients and is better correlated with disease progression.  CAT has a scoring range of zero to 40. The CAT score is classified into four groups of low (less than 10), medium (10 - 20), high (21-30) and very high (31-40) based on the impact level of disease on health status. A CAT score over 10 suggests significant symptoms.  A worsening CAT score could be explained by an exacerbation, poor medication adherence, poor inhaler technique, or progression of COPD or comorbid conditions.  CAT MCID is 2 points  mMRC: mMRC (Modified Medical Research Council) Dyspnea Scale is used to assess the degree of baseline functional  disability in patients of respiratory disease due to dyspnea. No minimal important difference is established. A decrease in score of 1 point or greater is considered a positive change.   Pulmonary Function Assessment:   Exercise Target Goals: Exercise Program Goal: Individual exercise prescription set using results from initial 6 min walk test and THRR while considering  patient's activity barriers and safety.   Exercise Prescription Goal: Initial exercise prescription builds to 30-45 minutes a day of aerobic activity, 2-3 days per week.  Home exercise guidelines will be given to patient during program as part of exercise prescription that the participant will acknowledge.  Education: Aerobic Exercise: - Group verbal and visual presentation on the components of exercise prescription. Introduces F.I.T.T principle from ACSM for exercise prescriptions.  Reviews F.I.T.T. principles of aerobic exercise including progression. Written material provided at class time.   Education: Resistance Exercise: - Group verbal and visual presentation on the components of exercise prescription. Introduces F.I.T.T principle from ACSM for exercise prescriptions  Reviews F.I.T.T. principles of resistance exercise including progression. Written material provided at class time.    Education: Exercise & Equipment Safety: - Individual verbal instruction and demonstration of equipment use and safety with use of the equipment. Flowsheet Row Pulmonary Rehab from 09/30/2023 in [facility] Cardiac and Pulmonary Rehab  Date 09/29/23  Educator jh  Instruction Review Code 1- Verbalizes Understanding    Education: Exercise Physiology & General Exercise Guidelines: - Group verbal and written instruction with models to review the exercise physiology of the cardiovascular system and associated critical values. Provides general exercise guidelines with specific guidelines to those with heart or lung disease.    Education:  Flexibility, Balance, Mind/Body Relaxation: - Group verbal and visual presentation with interactive activity on the components of exercise prescription. Introduces F.I.T.T principle from ACSM for exercise prescriptions. Reviews F.I.T.T. principles of flexibility and balance exercise training including progression. Also discusses the mind body connection.  Reviews various relaxation techniques to help reduce and manage stress (i.e. Deep breathing, progressive muscle relaxation, and visualization). Balance handout provided to take home. Written material provided at class time.   Activity Barriers & Risk Stratification:  Activity Barriers & Cardiac Risk Stratification - 09/30/23 1020       Activity Barriers & Cardiac Risk Stratification   Activity Barriers Joint Problems;Deconditioning;Balance Concerns;Muscular Weakness          6 Minute Walk:  6 Minute Walk     Row Name 09/30/23 1018  6 Minute Walk   Phase Initial     Distance 1020 feet     Walk Time 6 minutes     # of Rest Breaks 0     MPH 1.93     METS 1.96     RPE 12     Perceived Dyspnea  1     VO2 Peak 6.87     Symptoms No     Resting HR 78 bpm     Resting BP 114/68     Resting Oxygen Saturation  97 %     Exercise Oxygen Saturation  during 6 min walk 93 %     Max Ex. HR 101 bpm     Max Ex. BP 134/82     2 Minute Post BP 128/78       Interval HR   1 Minute HR 93     2 Minute HR 94     3 Minute HR 97     4 Minute HR 97     5 Minute HR 97     6 Minute HR 101     2 Minute Post HR 86     Interval Heart Rate? Yes       Interval Oxygen   Interval Oxygen? Yes     Baseline Oxygen Saturation % 97 %     1 Minute Oxygen Saturation % 93 %     1 Minute Liters of Oxygen 0 L  RA     2 Minute Oxygen Saturation % 96 %     2 Minute Liters of Oxygen 0 L     3 Minute Oxygen Saturation % 96 %     3 Minute Liters of Oxygen 0 L     4 Minute Oxygen Saturation % 96 %     4 Minute Liters of Oxygen 0 L     5 Minute Oxygen  Saturation % 93 %     5 Minute Liters of Oxygen 0 L     6 Minute Oxygen Saturation % 96 %     6 Minute Liters of Oxygen 0 L     2 Minute Post Oxygen Saturation % 97 %     2 Minute Post Liters of Oxygen 0 L       Oxygen Initial Assessment:   Oxygen Re-Evaluation:   Oxygen Discharge (Final Oxygen Re-Evaluation):   Initial Exercise Prescription:  Initial Exercise Prescription - 09/30/23 1000       Date of Initial Exercise RX and Referring Provider   Date 09/30/23    Referring Provider Dr. Prentice Flatten, MD      Oxygen   Maintain Oxygen Saturation 88% or higher      Treadmill   MPH 1.8    Grade 0    Minutes 15    METs 2.38      Recumbant Bike   Level 2    RPM 50    Watts 15    Minutes 15    METs 1.96      NuStep   Level 2    SPM 80    Minutes 15    METs 1.96      T5 Nustep   Level 1    SPM 80    Minutes 15    METs 1.96      Prescription Details   Frequency (times per week) 3    Duration Progress to 30 minutes of continuous aerobic without signs/symptoms of physical distress  Intensity   THRR 40-80% of Max Heartrate 103-129    Ratings of Perceived Exertion 11-13    Perceived Dyspnea 0-4      Progression   Progression Continue to progress workloads to maintain intensity without signs/symptoms of physical distress.      Resistance Training   Training Prescription Yes    Weight 3 lb    Reps 10-15          Perform Capillary Blood Glucose checks as needed.  Exercise Prescription Changes:   Exercise Prescription Changes     Row Name 09/30/23 1000             Response to Exercise   Blood Pressure (Admit) 114/68       Blood Pressure (Exercise) 134/82       Blood Pressure (Exit) 128/78       Heart Rate (Admit) 78 bpm       Heart Rate (Exercise) 101 bpm       Heart Rate (Exit) 86 bpm       Oxygen Saturation (Admit) 97 %       Oxygen Saturation (Exercise) 93 %       Oxygen Saturation (Exit) 97 %       Rating of Perceived Exertion  (Exercise) 12       Perceived Dyspnea (Exercise) 1       Symptoms none       Comments Results          Exercise Comments:   Exercise Goals and Review:   Exercise Goals     Row Name 09/30/23 1020             Exercise Goals   Increase Physical Activity Yes       Intervention Provide advice, education, support and counseling about physical activity/exercise needs.;Develop an individualized exercise prescription for aerobic and resistive training based on initial evaluation findings, risk stratification, comorbidities and participant's personal goals.       Expected Outcomes Short Term: Attend rehab on a regular basis to increase amount of physical activity.;Long Term: Add in home exercise to make exercise part of routine and to increase amount of physical activity.;Long Term: Exercising regularly at least 3-5 days a week.       Increase Strength and Stamina Yes       Intervention Provide advice, education, support and counseling about physical activity/exercise needs.;Develop an individualized exercise prescription for aerobic and resistive training based on initial evaluation findings, risk stratification, comorbidities and participant's personal goals.       Expected Outcomes Short Term: Increase workloads from initial exercise prescription for resistance, speed, and METs.;Short Term: Perform resistance training exercises routinely during rehab and add in resistance training at home;Long Term: Improve cardiorespiratory fitness, muscular endurance and strength as measured by increased METs and functional capacity ( )       Able to understand and use rate of perceived exertion (RPE) scale Yes       Intervention Provide education and explanation on how to use RPE scale       Expected Outcomes Short Term: Able to use RPE daily in rehab to express subjective intensity level;Long Term:  Able to use RPE to guide intensity level when exercising independently       Able to understand and  use Dyspnea scale Yes       Intervention Provide education and explanation on how to use Dyspnea scale       Expected Outcomes Short Term: Able to use  Dyspnea scale daily in rehab to express subjective sense of shortness of breath during exertion;Long Term: Able to use Dyspnea scale to guide intensity level when exercising independently       Knowledge and understanding of Target Heart Rate Range (THRR) Yes       Intervention Provide education and explanation of THRR including how the numbers were predicted and where they are located for reference       Expected Outcomes Short Term: Able to state/look up THRR;Short Term: Able to use daily as guideline for intensity in rehab;Long Term: Able to use THRR to govern intensity when exercising independently       Able to check pulse independently Yes       Intervention Provide education and demonstration on how to check pulse in carotid and radial arteries.;Review the importance of being able to check your own pulse for safety during independent exercise       Expected Outcomes Short Term: Able to explain why pulse checking is important during independent exercise;Long Term: Able to check pulse independently and accurately       Understanding of Exercise Prescription Yes       Intervention Provide education, explanation, and written materials on patient's individual exercise prescription       Expected Outcomes Short Term: Able to explain program exercise prescription;Long Term: Able to explain home exercise prescription to exercise independently          Exercise Goals Re-Evaluation :   Discharge Exercise Prescription (Final Exercise Prescription Changes):  Exercise Prescription Changes - 09/30/23 1000       Response to Exercise   Blood Pressure (Admit) 114/68    Blood Pressure (Exercise) 134/82    Blood Pressure (Exit) 128/78    Heart Rate (Admit) 78 bpm    Heart Rate (Exercise) 101 bpm    Heart Rate (Exit) 86 bpm    Oxygen Saturation (Admit)  97 %    Oxygen Saturation (Exercise) 93 %    Oxygen Saturation (Exit) 97 %    Rating of Perceived Exertion (Exercise) 12    Perceived Dyspnea (Exercise) 1    Symptoms none    Comments Results          Nutrition:  Target Goals: Understanding of nutrition guidelines, daily intake of sodium 1500mg , cholesterol 200mg , calories 30% from fat and 7% or less from saturated fats, daily to have 5 or more servings of fruits and vegetables.  Education: Nutrition 1 -Group instruction provided by verbal, written material, interactive activities, discussions, models, and posters to present general guidelines for heart healthy nutrition including macronutrients, label reading, and promoting whole foods over processed counterparts. Education serves as Pensions consultant of discussion of heart healthy eating for all. Written material provided at class time.     Education: Nutrition 2 -Group instruction provided by verbal, written material, interactive activities, discussions, models, and posters to present general guidelines for heart healthy nutrition including sodium, cholesterol, and saturated fat. Providing guidance of habit forming to improve blood pressure, cholesterol, and body weight. Written material provided at class time.     Biometrics:  Pre Biometrics - 09/30/23 1021       Pre Biometrics   Height 5' 9.5 (1.765 m)    Weight 198 lb 1.6 oz (89.9 kg)    Waist Circumference 39 inches    Hip Circumference 40 inches    Waist to Hip Ratio 0.98 %    BMI (Calculated) 28.84    Single Leg Stand 10.13 seconds  Nutrition Therapy Plan and Nutrition Goals:  Nutrition Therapy & Goals - 09/30/23 1002       Intervention Plan   Intervention Prescribe, educate and counsel regarding individualized specific dietary modifications aiming towards targeted core components such as weight, hypertension, lipid management, diabetes, heart failure and other comorbidities.    Expected Outcomes  Short Term Goal: Understand basic principles of dietary content, such as calories, fat, sodium, cholesterol and nutrients.;Short Term Goal: A plan has been developed with personal nutrition goals set during dietitian appointment.;Long Term Goal: Adherence to prescribed nutrition plan.          Nutrition Assessments:  MEDIFICTS Score Key: >=70 Need to make dietary changes  40-70 Heart Healthy Diet <= 40 Therapeutic Level Cholesterol Diet   Picture Your Plate Scores: <59 Unhealthy dietary pattern with much room for improvement. 41-50 Dietary pattern unlikely to meet recommendations for good health and room for improvement. 51-60 More healthful dietary pattern, with some room for improvement.  >60 Healthy dietary pattern, although there may be some specific behaviors that could be improved.   Nutrition Goals Re-Evaluation:   Nutrition Goals Discharge (Final Nutrition Goals Re-Evaluation):   Psychosocial: Target Goals: Acknowledge presence or absence of significant depression and/or stress, maximize coping skills, provide positive support system. Participant is able to verbalize types and ability to use techniques and skills needed for reducing stress and depression.   Education: Stress, Anxiety, and Depression - Group verbal and visual presentation to define topics covered.  Reviews how body is impacted by stress, anxiety, and depression.  Also discusses healthy ways to reduce stress and to treat/manage anxiety and depression.  Written material provided at class time.   Education: Sleep Hygiene -Provides group verbal and written instruction about how sleep can affect your health.  Define sleep hygiene, discuss sleep cycles and impact of sleep habits. Review good sleep hygiene tips.    Initial Review & Psychosocial Screening:  Initial Psych Review & Screening - 09/29/23 1426       Initial Review   Current issues with Current Psychotropic Meds;History of Depression;Current  Depression;Current Anxiety/Panic      Family Dynamics   Good Support System? Yes    Comments Sneijder states that his depression is there and has a family history of depression. He can look to his spouse for support.      Barriers   Psychosocial barriers to participate in program There are no identifiable barriers or psychosocial needs.;The patient should benefit from training in stress management and relaxation.      Screening Interventions   Interventions To provide support and resources with identified psychosocial needs;Provide feedback about the scores to participant;Encouraged to exercise    Expected Outcomes Short Term goal: Utilizing psychosocial counselor, staff and physician to assist with identification of specific Stressors or current issues interfering with healing process. Setting desired goal for each stressor or current issue identified.;Long Term Goal: Stressors or current issues are controlled or eliminated.;Short Term goal: Identification and review with participant of any Quality of Life or Depression concerns found by scoring the questionnaire.;Long Term goal: The participant improves quality of Life and PHQ9 Scores as seen by post scores and/or verbalization of changes          Quality of Life Scores:  Scores of 19 and below usually indicate a poorer quality of life in these areas.  A difference of  2-3 points is a clinically meaningful difference.  A difference of 2-3 points in the total score of the Quality of  Life Index has been associated with significant improvement in overall quality of life, self-image, physical symptoms, and general health in studies assessing change in quality of life.  PHQ-9: Review Flowsheet  More data exists      09/30/2023 09/06/2019 08/02/2019 07/26/2019 07/20/2019  Depression screen PHQ 2/9  Decreased Interest 2 1 1 1 1   Down, Depressed, Hopeless 2 1 0 1 1  PHQ - 2 Score 4 2 1 2 2   Altered sleeping 2 0 0 0 0  Tired, decreased energy 2 1 1  1 1   Change in appetite 1 0 0 0 0  Feeling bad or failure about yourself  0 0 0 0 0  Trouble concentrating 2 0 0 0 0  Moving slowly or fidgety/restless 1 1 0 0 0  Suicidal thoughts 0 0 0 0 0  PHQ-9 Score 12 4 2 3 3   Difficult doing work/chores Somewhat difficult Not difficult at all Not difficult at all Not difficult at all Somewhat difficult   Interpretation of Total Score  Total Score Depression Severity:  1-4 = Minimal depression, 5-9 = Mild depression, 10-14 = Moderate depression, 15-19 = Moderately severe depression, 20-27 = Severe depression   Psychosocial Evaluation and Intervention:  Psychosocial Evaluation - 09/29/23 1428       Psychosocial Evaluation & Interventions   Interventions Encouraged to exercise with the program and follow exercise prescription;Relaxation education;Stress management education    Comments Nikkolas states that his depression is there and has a family history of depression. He can look to his spouse for support.    Expected Outcomes Short: Start [facility] to help with mood. Long: Maintain a healthy mental state    Continue Psychosocial Services  Follow up required by staff          Psychosocial Re-Evaluation:   Psychosocial Discharge (Final Psychosocial Re-Evaluation):   Education: Education Goals: Education classes will be provided on a weekly basis, covering required topics. Participant will state understanding/return demonstration of topics presented.  Learning Barriers/Preferences:  Learning Barriers/Preferences - 09/29/23 1426       Learning Barriers/Preferences   Learning Barriers Hearing    Learning Preferences None          General Pulmonary Education Topics:  Infection Prevention: - Provides verbal and written material to individual with discussion of infection control including proper hand washing and proper equipment cleaning during exercise session. Flowsheet Row Pulmonary Rehab from 09/30/2023 in [facility] Cardiac and  Pulmonary Rehab  Date 09/29/23  Educator jh  Instruction Review Code 1- Verbalizes Understanding    Falls Prevention: - Provides verbal and written material to individual with discussion of falls prevention and safety. Flowsheet Row Pulmonary Rehab from 09/30/2023 in [facility] Cardiac and Pulmonary Rehab  Date 09/29/23  Educator jh  Instruction Review Code 1- Verbalizes Understanding    Chronic Lung Disease Review: - Group verbal instruction with posters, models, PowerPoint presentations and videos,  to review new updates, new respiratory medications, new advancements in procedures and treatments. Providing information on websites and 800 numbers for continued self-education. Includes information about supplement oxygen, available portable oxygen systems, continuous and intermittent flow rates, oxygen safety, concentrators, and Medicare reimbursement for oxygen. Explanation of Pulmonary Drugs, including class, frequency, complications, importance of spacers, rinsing mouth after steroid MDI's, and proper cleaning methods for nebulizers. Review of basic lung anatomy and physiology related to function, structure, and complications of lung disease. Review of risk factors. Discussion about methods for diagnosing sleep apnea and types of masks and machines for  OSA. Includes a review of the use of types of environmental controls: home humidity, furnaces, filters, dust mite/pet prevention, HEPA vacuums. Discussion about weather changes, air quality and the benefits of nasal washing. Instruction on Warning signs, infection symptoms, calling MD promptly, preventive modes, and value of vaccinations. Review of effective airway clearance, coughing and/or vibration techniques. Emphasizing that all should Create an Action Plan. Written material provided at class time. Flowsheet Row Cardiac Rehab from 08/30/2019 in [facility] Cardiac and Pulmonary Rehab  Date 08/30/19  Educator jh  Instruction Review Code 1- Verbalizes  Understanding    AED/CPR: - Group verbal and written instruction with the use of models to demonstrate the basic use of the AED with the basic ABC's of resuscitation.    Tests and Procedures:  - Group verbal and visual presentation and models provide information about basic cardiac anatomy and function. Reviews the testing methods done to diagnose heart disease and the outcomes of the test results. Describes the treatment choices: Medical Management, Angioplasty, or Coronary Bypass Surgery for treating various heart conditions including Myocardial Infarction, Angina, Valve Disease, and Cardiac Arrhythmias.  Written material provided at class time.   Medication Safety: - Group verbal and visual instruction to review commonly prescribed medications for heart and lung disease. Reviews the medication, class of the drug, and side effects. Includes the steps to properly store meds and maintain the prescription regimen.  Written material given at graduation.   Other: -Provides group and verbal instruction on various topics (see comments)   Knowledge Questionnaire Score:  Knowledge Questionnaire Score - 09/30/23 1001       Knowledge Questionnaire Score   Pre Score 14/18           Core Components/Risk Factors/Patient Goals at Admission:  Personal Goals and Risk Factors at Admission - 09/29/23 1425       Core Components/Risk Factors/Patient Goals on Admission    Weight Management Yes;Weight Loss    Intervention Weight Management: Develop a combined nutrition and exercise program designed to reach desired caloric intake, while maintaining appropriate intake of nutrient and fiber, sodium and fats, and appropriate energy expenditure required for the weight goal.;Weight Management: Provide education and appropriate resources to help participant work on and attain dietary goals.;Weight Management/Obesity: Establish reasonable short term and long term weight goals.    Expected Outcomes Short  Term: Continue to assess and modify interventions until short term weight is achieved;Long Term: Adherence to nutrition and physical activity/exercise program aimed toward attainment of established weight goal;Weight Loss: Understanding of general recommendations for a balanced deficit meal plan, which promotes 1-2 lb weight loss per week and includes a negative energy balance of [phone] kcal/d;Understanding recommendations for meals to include 15-35% energy as protein, 25-35% energy from fat, 35-60% energy from carbohydrates, less than 200mg  of dietary cholesterol, 20-35 gm of total fiber daily;Understanding of distribution of calorie intake throughout the day with the consumption of 4-5 meals/snacks    Improve shortness of breath with ADL's Yes    Intervention Provide education, individualized exercise plan and daily activity instruction to help decrease symptoms of SOB with activities of daily living.    Expected Outcomes Short Term: Improve cardiorespiratory fitness to achieve a reduction of symptoms when performing ADLs;Long Term: Be able to perform more ADLs without symptoms or delay the onset of symptoms    Heart Failure Yes    Intervention Provide a combined exercise and nutrition program that is supplemented with education, support and counseling about heart failure. Directed toward relieving symptoms  such as shortness of breath, decreased exercise tolerance, and extremity edema.    Expected Outcomes Improve functional capacity of life;Short term: Attendance in program 2-3 days a week with increased exercise capacity. Reported lower sodium intake. Reported increased fruit and vegetable intake. Reports medication compliance.;Short term: Daily weights obtained and reported for increase. Utilizing diuretic protocols set by physician.;Long term: Adoption of self-care skills and reduction of barriers for early signs and symptoms recognition and intervention leading to self-care maintenance.     Hypertension Yes    Intervention Provide education on lifestyle modifcations including regular physical activity/exercise, weight management, moderate sodium restriction and increased consumption of fresh fruit, vegetables, and low fat dairy, alcohol moderation, and smoking cessation.;Monitor prescription use compliance.    Expected Outcomes Short Term: Continued assessment and intervention until BP is < 140/66mm HG in hypertensive participants. < 130/23mm HG in hypertensive participants with diabetes, heart failure or chronic kidney disease.;Long Term: Maintenance of blood pressure at goal levels.    Lipids Yes    Intervention Provide education and support for participant on nutrition & aerobic/resistive exercise along with prescribed medications to achieve LDL 70mg , HDL >40mg .    Expected Outcomes Short Term: Participant states understanding of desired cholesterol values and is compliant with medications prescribed. Participant is following exercise prescription and nutrition guidelines.;Long Term: Cholesterol controlled with medications as prescribed, with individualized exercise RX and with personalized nutrition plan. Value goals: LDL < 70mg , HDL > 40 mg.          Education:Diabetes - Individual verbal and written instruction to review signs/symptoms of diabetes, desired ranges of glucose level fasting, after meals and with exercise. Acknowledge that pre and post exercise glucose checks will be done for 3 sessions at entry of program.   Know Your Numbers and Heart Failure: - Group verbal and visual instruction to discuss disease risk factors for cardiac and pulmonary disease and treatment options.  Reviews associated critical values for Overweight/Obesity, Hypertension, Cholesterol, and Diabetes.  Discusses basics of heart failure: signs/symptoms and treatments.  Introduces Heart Failure Zone chart for action plan for heart failure. Written material provided at class time. Flowsheet Row  Pulmonary Rehab [facility] 09/30/20[facility]  Education need identified 09/30/23    Core Components/Risk Factors/Patient Goals Review:    Core Components/Risk Factors/Patient Goals at Discharge (Final Review):    ITP Comments:  ITP Comments     Row Name 09/29/23 1431 09/30/23 0957         ITP Comments Virtual Visit completed. Patient informed on EP and RD appointment and 6 Minute walk test. Patient also informed of patient health questionnaires on My Chart. Patient Verbalizes understanding. Visit diagnosis can be found in [facility] 06/03/2023. Completed and gym orientation for respiratory care services. Initial ITP created and sent for review to Dr. Faud Aleskerov, Medical Director.         Comments: Initial ITP

## 2023-09-30 NOTE — Patient Instructions (Signed)
 Patient Instructions  Patient Details  Name: Chase Boyd MRN: [id number] Date of Birth: 1945-09-16 Referring Provider:  Cesario Barter, MD  Below are your personal goals for exercise, nutrition, and risk factors. Our goal is to help you stay on track towards obtaining and maintaining these goals. We will be discussing your progress on these goals with you throughout the program.  Initial Exercise Prescription:  Initial Exercise Prescription - 09/30/23 1000       Date of Initial Exercise RX and Referring Provider   Date 09/30/23    Referring Provider Dr. Barter Cesario, MD      Oxygen   Maintain Oxygen Saturation 88% or higher      Treadmill   MPH 1.8    Grade 0    Minutes 15    METs 2.38      Recumbant Bike   Level 2    RPM 50    Watts 15    Minutes 15    METs 1.96      NuStep   Level 2    SPM 80    Minutes 15    METs 1.96      T5 Nustep   Level 1    SPM 80    Minutes 15    METs 1.96      Prescription Details   Frequency (times per week) 3    Duration Progress to 30 minutes of continuous aerobic without signs/symptoms of physical distress      Intensity   THRR 40-80% of Max Heartrate 103-129    Ratings of Perceived Exertion 11-13    Perceived Dyspnea 0-4      Progression   Progression Continue to progress workloads to maintain intensity without signs/symptoms of physical distress.      Resistance Training   Training Prescription Yes    Weight 3 lb    Reps 10-15          Exercise Goals: Frequency: Be able to perform aerobic exercise two to three times per week in program working toward 2-5 days per week of home exercise.  Intensity: Work with a perceived exertion of 11 (fairly light) - 15 (hard) while following your exercise prescription.  We will make changes to your prescription with you as you progress through the program.   Duration: Be able to do 30 to 45 minutes of continuous aerobic exercise in addition to a 5 minute warm-up and a 5  minute cool-down routine.   Nutrition Goals: Your personal nutrition goals will be established when you do your nutrition analysis with the dietician.  The following are general nutrition guidelines to follow: Cholesterol < 200mg /day Sodium < 1500mg /day Fiber: Men over 50 yrs - 30 grams per day  Personal Goals:  Personal Goals and Risk Factors at Admission - 09/29/23 1425       Core Components/Risk Factors/Patient Goals on Admission    Weight Management Yes;Weight Loss    Intervention Weight Management: Develop a combined nutrition and exercise program designed to reach desired caloric intake, while maintaining appropriate intake of nutrient and fiber, sodium and fats, and appropriate energy expenditure required for the weight goal.;Weight Management: Provide education and appropriate resources to help participant work on and attain dietary goals.;Weight Management/Obesity: Establish reasonable short term and long term weight goals.    Expected Outcomes Short Term: Continue to assess and modify interventions until short term weight is achieved;Long Term: Adherence to nutrition and physical activity/exercise program aimed toward attainment of established weight goal;Weight Loss:  Understanding of general recommendations for a balanced deficit meal plan, which promotes 1-2 lb weight loss per week and includes a negative energy balance of [phone] kcal/d;Understanding recommendations for meals to include 15-35% energy as protein, 25-35% energy from fat, 35-60% energy from carbohydrates, less than 200mg  of dietary cholesterol, 20-35 gm of total fiber daily;Understanding of distribution of calorie intake throughout the day with the consumption of 4-5 meals/snacks    Improve shortness of breath with ADL's Yes    Intervention Provide education, individualized exercise plan and daily activity instruction to help decrease symptoms of SOB with activities of daily living.    Expected Outcomes Short Term:  Improve cardiorespiratory fitness to achieve a reduction of symptoms when performing ADLs;Long Term: Be able to perform more ADLs without symptoms or delay the onset of symptoms    Heart Failure Yes    Intervention Provide a combined exercise and nutrition program that is supplemented with education, support and counseling about heart failure. Directed toward relieving symptoms such as shortness of breath, decreased exercise tolerance, and extremity edema.    Expected Outcomes Improve functional capacity of life;Short term: Attendance in program 2-3 days a week with increased exercise capacity. Reported lower sodium intake. Reported increased fruit and vegetable intake. Reports medication compliance.;Short term: Daily weights obtained and reported for increase. Utilizing diuretic protocols set by physician.;Long term: Adoption of self-care skills and reduction of barriers for early signs and symptoms recognition and intervention leading to self-care maintenance.    Hypertension Yes    Intervention Provide education on lifestyle modifcations including regular physical activity/exercise, weight management, moderate sodium restriction and increased consumption of fresh fruit, vegetables, and low fat dairy, alcohol moderation, and smoking cessation.;Monitor prescription use compliance.    Expected Outcomes Short Term: Continued assessment and intervention until BP is < 140/5mm HG in hypertensive participants. < 130/33mm HG in hypertensive participants with diabetes, heart failure or chronic kidney disease.;Long Term: Maintenance of blood pressure at goal levels.    Lipids Yes    Intervention Provide education and support for participant on nutrition & aerobic/resistive exercise along with prescribed medications to achieve LDL 70mg , HDL >40mg .    Expected Outcomes Short Term: Participant states understanding of desired cholesterol values and is compliant with medications prescribed. Participant is following  exercise prescription and nutrition guidelines.;Long Term: Cholesterol controlled with medications as prescribed, with individualized exercise RX and with personalized nutrition plan. Value goals: LDL < 70mg , HDL > 40 mg.         Exercise Goals and Review:  Exercise Goals     Row Name 09/30/23 1020             Exercise Goals   Increase Physical Activity Yes       Intervention Provide advice, education, support and counseling about physical activity/exercise needs.;Develop an individualized exercise prescription for aerobic and resistive training based on initial evaluation findings, risk stratification, comorbidities and participant's personal goals.       Expected Outcomes Short Term: Attend rehab on a regular basis to increase amount of physical activity.;Long Term: Add in home exercise to make exercise part of routine and to increase amount of physical activity.;Long Term: Exercising regularly at least 3-5 days a week.       Increase Strength and Stamina Yes       Intervention Provide advice, education, support and counseling about physical activity/exercise needs.;Develop an individualized exercise prescription for aerobic and resistive training based on initial evaluation findings, risk stratification, comorbidities and participant's personal goals.  Expected Outcomes Short Term: Increase workloads from initial exercise prescription for resistance, speed, and METs.;Short Term: Perform resistance training exercises routinely during rehab and add in resistance training at home;Long Term: Improve cardiorespiratory fitness, muscular endurance and strength as measured by increased METs and functional capacity ( )       Able to understand and use rate of perceived exertion (RPE) scale Yes       Intervention Provide education and explanation on how to use RPE scale       Expected Outcomes Short Term: Able to use RPE daily in rehab to express subjective intensity level;Long Term:  Able to  use RPE to guide intensity level when exercising independently       Able to understand and use Dyspnea scale Yes       Intervention Provide education and explanation on how to use Dyspnea scale       Expected Outcomes Short Term: Able to use Dyspnea scale daily in rehab to express subjective sense of shortness of breath during exertion;Long Term: Able to use Dyspnea scale to guide intensity level when exercising independently       Knowledge and understanding of Target Heart Rate Range (THRR) Yes       Intervention Provide education and explanation of THRR including how the numbers were predicted and where they are located for reference       Expected Outcomes Short Term: Able to state/look up THRR;Short Term: Able to use daily as guideline for intensity in rehab;Long Term: Able to use THRR to govern intensity when exercising independently       Able to check pulse independently Yes       Intervention Provide education and demonstration on how to check pulse in carotid and radial arteries.;Review the importance of being able to check your own pulse for safety during independent exercise       Expected Outcomes Short Term: Able to explain why pulse checking is important during independent exercise;Long Term: Able to check pulse independently and accurately       Understanding of Exercise Prescription Yes       Intervention Provide education, explanation, and written materials on patient's individual exercise prescription       Expected Outcomes Short Term: Able to explain program exercise prescription;Long Term: Able to explain home exercise prescription to exercise independently

## 2023-10-06 ENCOUNTER — Encounter: Admitting: Emergency Medicine

## 2023-10-06 DIAGNOSIS — I5042 Chronic combined systolic (congestive) and diastolic (congestive) heart failure: Secondary | ICD-10-CM

## 2023-10-06 NOTE — Progress Notes (Signed)
 Pulmonary Individual Treatment Plan  Patient Details  Name: Chase Boyd MRN: [id number] Date of Birth: 01-Feb-1946 Referring Provider:   Flowsheet Row Pulmonary Rehab from 09/30/2023 in [facility] Cardiac and Pulmonary Rehab  Referring Provider Dr. Prentice Flatten, MD    Initial Encounter Date:  Flowsheet Row Pulmonary Rehab from 09/30/2023 in [facility] Cardiac and Pulmonary Rehab  Date 09/30/23    Visit Diagnosis: Heart failure, systolic and diastolic, chronic (HCC)  Patient's Home Medications on Admission:  Current Outpatient Medications:    amiodarone (PACERONE) 200 MG tablet, Take by mouth. (Patient not taking: Reported on 09/29/2023), Disp: , Rfl:    amLODipine (NORVASC) 5 MG tablet, Take by mouth. (Patient not taking: Reported on 09/29/2023), Disp: , Rfl:    amLODipine (NORVASC) 5 MG tablet, Take 5 mg by mouth daily., Disp: , Rfl:    amoxicillin (AMOXIL) 500 MG capsule, TAKE 4 CAPSULES (2,000MG  TOTAL) BY MOUTH 1 HOUR PRIOR TO DENTIST, Disp: , Rfl:    apixaban (ELIQUIS) 5 MG TABS tablet, Take by mouth. (Patient not taking: Reported on 09/29/2023), Disp: , Rfl:    apixaban (ELIQUIS) 5 MG TABS tablet, Take 5 mg by mouth., Disp: , Rfl:    ARIPiprazole (ABILIFY) 10 MG tablet, Take 10 mg by mouth., Disp: , Rfl:    buPROPion (WELLBUTRIN XL) 150 MG 24 hr tablet, Take by mouth., Disp: , Rfl:    buPROPion (WELLBUTRIN XL) 300 MG 24 hr tablet, Take 300 mg by mouth daily., Disp: , Rfl:    colestipol (COLESTID) 1 g tablet, Take 2 g by mouth., Disp: , Rfl:    escitalopram (LEXAPRO) 20 MG tablet, Take by mouth. (Patient not taking: Reported on 09/29/2023), Disp: , Rfl:    finasteride (PROSCAR) 5 MG tablet, Take 1 tablet PO daily (Patient not taking: Reported on 09/29/2023), Disp: , Rfl:    finasteride (PROSCAR) 5 MG tablet, Take 5 mg by mouth daily., Disp: , Rfl:    furosemide (LASIX) 20 MG tablet, Take by mouth., Disp: , Rfl:    metoprolol succinate (TOPROL-XL) 25 MG 24 hr tablet, Take 25 mg by mouth  daily., Disp: , Rfl:    Multiple Vitamin (MULTI-VITAMIN) tablet, Take by mouth., Disp: , Rfl:    Multiple Vitamins-Minerals (ONE DAILY CALCIUM/IRON) TABS, Take by mouth. (Patient not taking: Reported on 09/29/2023), Disp: , Rfl:    PARoxetine (PAXIL) 30 MG tablet, Take 30 mg by mouth., Disp: , Rfl:    pravastatin (PRAVACHOL) 20 MG tablet, Take 20 mg by mouth., Disp: , Rfl:    tamsulosin (FLOMAX) 0.4 MG CAPS capsule, Take 0.4 mg by mouth., Disp: , Rfl:   Past Medical History: No past medical history on file.  Tobacco Use: Social History   Tobacco Use  Smoking Status Never  Smokeless Tobacco Never    Labs: Review Flowsheet        No data to display           Pulmonary Assessment Scores:  Pulmonary Assessment Scores     Row Name 09/30/23 0959         ADL UCSD   ADL Phase Entry     SOB Score total 31     Rest 1     Walk 1     Stairs 2     Bath 0     Dress 0     Shop 1       CAT Score   CAT Score 14       mMRC Score  mMRC Score 2        UCSD: Self-administered rating of dyspnea associated with activities of daily living (ADLs) 6-point scale (0 = not at all to 5 = maximal or unable to do because of breathlessness)  Scoring Scores range from 0 to 120.  Minimally important difference is 5 units  CAT: CAT can identify the health impairment of COPD patients and is better correlated with disease progression.  CAT has a scoring range of zero to 40. The CAT score is classified into four groups of low (less than 10), medium (10 - 20), high (21-30) and very high (31-40) based on the impact level of disease on health status. A CAT score over 10 suggests significant symptoms.  A worsening CAT score could be explained by an exacerbation, poor medication adherence, poor inhaler technique, or progression of COPD or comorbid conditions.  CAT MCID is 2 points  mMRC: mMRC (Modified Medical Research Council) Dyspnea Scale is used to assess the degree of baseline functional  disability in patients of respiratory disease due to dyspnea. No minimal important difference is established. A decrease in score of 1 point or greater is considered a positive change.   Pulmonary Function Assessment:   Exercise Target Goals: Exercise Program Goal: Individual exercise prescription set using results from initial 6 min walk test and THRR while considering  patient's activity barriers and safety.   Exercise Prescription Goal: Initial exercise prescription builds to 30-45 minutes a day of aerobic activity, 2-3 days per week.  Home exercise guidelines will be given to patient during program as part of exercise prescription that the participant will acknowledge.  Education: Aerobic Exercise: - Group verbal and visual presentation on the components of exercise prescription. Introduces F.I.T.T principle from ACSM for exercise prescriptions.  Reviews F.I.T.T. principles of aerobic exercise including progression. Written material provided at class time.   Education: Resistance Exercise: - Group verbal and visual presentation on the components of exercise prescription. Introduces F.I.T.T principle from ACSM for exercise prescriptions  Reviews F.I.T.T. principles of resistance exercise including progression. Written material provided at class time.    Education: Exercise & Equipment Safety: - Individual verbal instruction and demonstration of equipment use and safety with use of the equipment. Flowsheet Row Pulmonary Rehab from 09/30/2023 in [facility] Cardiac and Pulmonary Rehab  Date 09/29/23  Educator jh  Instruction Review Code 1- Verbalizes Understanding    Education: Exercise Physiology & General Exercise Guidelines: - Group verbal and written instruction with models to review the exercise physiology of the cardiovascular system and associated critical values. Provides general exercise guidelines with specific guidelines to those with heart or lung disease.    Education:  Flexibility, Balance, Mind/Body Relaxation: - Group verbal and visual presentation with interactive activity on the components of exercise prescription. Introduces F.I.T.T principle from ACSM for exercise prescriptions. Reviews F.I.T.T. principles of flexibility and balance exercise training including progression. Also discusses the mind body connection.  Reviews various relaxation techniques to help reduce and manage stress (i.e. Deep breathing, progressive muscle relaxation, and visualization). Balance handout provided to take home. Written material provided at class time.   Activity Barriers & Risk Stratification:  Activity Barriers & Cardiac Risk Stratification - 09/30/23 1020       Activity Barriers & Cardiac Risk Stratification   Activity Barriers Joint Problems;Deconditioning;Balance Concerns;Muscular Weakness          6 Minute Walk:  6 Minute Walk     Row Name 09/30/23 1018  6 Minute Walk   Phase Initial     Distance 1020 feet     Walk Time 6 minutes     # of Rest Breaks 0     MPH 1.93     METS 1.96     RPE 12     Perceived Dyspnea  1     VO2 Peak 6.87     Symptoms No     Resting HR 78 bpm     Resting BP 114/68     Resting Oxygen Saturation  97 %     Exercise Oxygen Saturation  during 6 min walk 93 %     Max Ex. HR 101 bpm     Max Ex. BP 134/82     2 Minute Post BP 128/78       Interval HR   1 Minute HR 93     2 Minute HR 94     3 Minute HR 97     4 Minute HR 97     5 Minute HR 97     6 Minute HR 101     2 Minute Post HR 86     Interval Heart Rate? Yes       Interval Oxygen   Interval Oxygen? Yes     Baseline Oxygen Saturation % 97 %     1 Minute Oxygen Saturation % 93 %     1 Minute Liters of Oxygen 0 L  RA     2 Minute Oxygen Saturation % 96 %     2 Minute Liters of Oxygen 0 L     3 Minute Oxygen Saturation % 96 %     3 Minute Liters of Oxygen 0 L     4 Minute Oxygen Saturation % 96 %     4 Minute Liters of Oxygen 0 L     5 Minute Oxygen  Saturation % 93 %     5 Minute Liters of Oxygen 0 L     6 Minute Oxygen Saturation % 96 %     6 Minute Liters of Oxygen 0 L     2 Minute Post Oxygen Saturation % 97 %     2 Minute Post Liters of Oxygen 0 L       Oxygen Initial Assessment:   Oxygen Re-Evaluation:   Oxygen Discharge (Final Oxygen Re-Evaluation):   Initial Exercise Prescription:  Initial Exercise Prescription - 09/30/23 1000       Date of Initial Exercise RX and Referring Provider   Date 09/30/23    Referring Provider Dr. Prentice Flatten, MD      Oxygen   Maintain Oxygen Saturation 88% or higher      Treadmill   MPH 1.8    Grade 0    Minutes 15    METs 2.38      Recumbant Bike   Level 2    RPM 50    Watts 15    Minutes 15    METs 1.96      NuStep   Level 2    SPM 80    Minutes 15    METs 1.96      T5 Nustep   Level 1    SPM 80    Minutes 15    METs 1.96      Prescription Details   Frequency (times per week) 3    Duration Progress to 30 minutes of continuous aerobic without signs/symptoms of physical distress  Intensity   THRR 40-80% of Max Heartrate 103-129    Ratings of Perceived Exertion 11-13    Perceived Dyspnea 0-4      Progression   Progression Continue to progress workloads to maintain intensity without signs/symptoms of physical distress.      Resistance Training   Training Prescription Yes    Weight 3 lb    Reps 10-15          Perform Capillary Blood Glucose checks as needed.  Exercise Prescription Changes:   Exercise Prescription Changes     Row Name 09/30/23 1000             Response to Exercise   Blood Pressure (Admit) 114/68       Blood Pressure (Exercise) 134/82       Blood Pressure (Exit) 128/78       Heart Rate (Admit) 78 bpm       Heart Rate (Exercise) 101 bpm       Heart Rate (Exit) 86 bpm       Oxygen Saturation (Admit) 97 %       Oxygen Saturation (Exercise) 93 %       Oxygen Saturation (Exit) 97 %       Rating of Perceived Exertion  (Exercise) 12       Perceived Dyspnea (Exercise) 1       Symptoms none       Comments Results          Exercise Comments:   Exercise Goals and Review:   Exercise Goals     Row Name 09/30/23 1020             Exercise Goals   Increase Physical Activity Yes       Intervention Provide advice, education, support and counseling about physical activity/exercise needs.;Develop an individualized exercise prescription for aerobic and resistive training based on initial evaluation findings, risk stratification, comorbidities and participant's personal goals.       Expected Outcomes Short Term: Attend rehab on a regular basis to increase amount of physical activity.;Long Term: Add in home exercise to make exercise part of routine and to increase amount of physical activity.;Long Term: Exercising regularly at least 3-5 days a week.       Increase Strength and Stamina Yes       Intervention Provide advice, education, support and counseling about physical activity/exercise needs.;Develop an individualized exercise prescription for aerobic and resistive training based on initial evaluation findings, risk stratification, comorbidities and participant's personal goals.       Expected Outcomes Short Term: Increase workloads from initial exercise prescription for resistance, speed, and METs.;Short Term: Perform resistance training exercises routinely during rehab and add in resistance training at home;Long Term: Improve cardiorespiratory fitness, muscular endurance and strength as measured by increased METs and functional capacity ( )       Able to understand and use rate of perceived exertion (RPE) scale Yes       Intervention Provide education and explanation on how to use RPE scale       Expected Outcomes Short Term: Able to use RPE daily in rehab to express subjective intensity level;Long Term:  Able to use RPE to guide intensity level when exercising independently       Able to understand and  use Dyspnea scale Yes       Intervention Provide education and explanation on how to use Dyspnea scale       Expected Outcomes Short Term: Able to use  Dyspnea scale daily in rehab to express subjective sense of shortness of breath during exertion;Long Term: Able to use Dyspnea scale to guide intensity level when exercising independently       Knowledge and understanding of Target Heart Rate Range (THRR) Yes       Intervention Provide education and explanation of THRR including how the numbers were predicted and where they are located for reference       Expected Outcomes Short Term: Able to state/look up THRR;Short Term: Able to use daily as guideline for intensity in rehab;Long Term: Able to use THRR to govern intensity when exercising independently       Able to check pulse independently Yes       Intervention Provide education and demonstration on how to check pulse in carotid and radial arteries.;Review the importance of being able to check your own pulse for safety during independent exercise       Expected Outcomes Short Term: Able to explain why pulse checking is important during independent exercise;Long Term: Able to check pulse independently and accurately       Understanding of Exercise Prescription Yes       Intervention Provide education, explanation, and written materials on patient's individual exercise prescription       Expected Outcomes Short Term: Able to explain program exercise prescription;Long Term: Able to explain home exercise prescription to exercise independently          Exercise Goals Re-Evaluation :   Discharge Exercise Prescription (Final Exercise Prescription Changes):  Exercise Prescription Changes - 09/30/23 1000       Response to Exercise   Blood Pressure (Admit) 114/68    Blood Pressure (Exercise) 134/82    Blood Pressure (Exit) 128/78    Heart Rate (Admit) 78 bpm    Heart Rate (Exercise) 101 bpm    Heart Rate (Exit) 86 bpm    Oxygen Saturation (Admit)  97 %    Oxygen Saturation (Exercise) 93 %    Oxygen Saturation (Exit) 97 %    Rating of Perceived Exertion (Exercise) 12    Perceived Dyspnea (Exercise) 1    Symptoms none    Comments Results          Nutrition:  Target Goals: Understanding of nutrition guidelines, daily intake of sodium 1500mg , cholesterol 200mg , calories 30% from fat and 7% or less from saturated fats, daily to have 5 or more servings of fruits and vegetables.  Education: Nutrition 1 -Group instruction provided by verbal, written material, interactive activities, discussions, models, and posters to present general guidelines for heart healthy nutrition including macronutrients, label reading, and promoting whole foods over processed counterparts. Education serves as Pensions consultant of discussion of heart healthy eating for all. Written material provided at class time.     Education: Nutrition 2 -Group instruction provided by verbal, written material, interactive activities, discussions, models, and posters to present general guidelines for heart healthy nutrition including sodium, cholesterol, and saturated fat. Providing guidance of habit forming to improve blood pressure, cholesterol, and body weight. Written material provided at class time.     Biometrics:  Pre Biometrics - 09/30/23 1021       Pre Biometrics   Height 5' 9.5 (1.765 m)    Weight 198 lb 1.6 oz (89.9 kg)    Waist Circumference 39 inches    Hip Circumference 40 inches    Waist to Hip Ratio 0.98 %    BMI (Calculated) 28.84    Single Leg Stand 10.13 seconds  Nutrition Therapy Plan and Nutrition Goals:  Nutrition Therapy & Goals - 09/30/23 1002       Intervention Plan   Intervention Prescribe, educate and counsel regarding individualized specific dietary modifications aiming towards targeted core components such as weight, hypertension, lipid management, diabetes, heart failure and other comorbidities.    Expected Outcomes  Short Term Goal: Understand basic principles of dietary content, such as calories, fat, sodium, cholesterol and nutrients.;Short Term Goal: A plan has been developed with personal nutrition goals set during dietitian appointment.;Long Term Goal: Adherence to prescribed nutrition plan.          Nutrition Assessments:  MEDIFICTS Score Key: >=70 Need to make dietary changes  40-70 Heart Healthy Diet <= 40 Therapeutic Level Cholesterol Diet   Picture Your Plate Scores: <59 Unhealthy dietary pattern with much room for improvement. 41-50 Dietary pattern unlikely to meet recommendations for good health and room for improvement. 51-60 More healthful dietary pattern, with some room for improvement.  >60 Healthy dietary pattern, although there may be some specific behaviors that could be improved.   Nutrition Goals Re-Evaluation:   Nutrition Goals Discharge (Final Nutrition Goals Re-Evaluation):   Psychosocial: Target Goals: Acknowledge presence or absence of significant depression and/or stress, maximize coping skills, provide positive support system. Participant is able to verbalize types and ability to use techniques and skills needed for reducing stress and depression.   Education: Stress, Anxiety, and Depression - Group verbal and visual presentation to define topics covered.  Reviews how body is impacted by stress, anxiety, and depression.  Also discusses healthy ways to reduce stress and to treat/manage anxiety and depression.  Written material provided at class time.   Education: Sleep Hygiene -Provides group verbal and written instruction about how sleep can affect your health.  Define sleep hygiene, discuss sleep cycles and impact of sleep habits. Review good sleep hygiene tips.    Initial Review & Psychosocial Screening:  Initial Psych Review & Screening - 09/29/23 1426       Initial Review   Current issues with Current Psychotropic Meds;History of Depression;Current  Depression;Current Anxiety/Panic      Family Dynamics   Good Support System? Yes    Comments Olivia states that his depression is there and has a family history of depression. He can look to his spouse for support.      Barriers   Psychosocial barriers to participate in program There are no identifiable barriers or psychosocial needs.;The patient should benefit from training in stress management and relaxation.      Screening Interventions   Interventions To provide support and resources with identified psychosocial needs;Provide feedback about the scores to participant;Encouraged to exercise    Expected Outcomes Short Term goal: Utilizing psychosocial counselor, staff and physician to assist with identification of specific Stressors or current issues interfering with healing process. Setting desired goal for each stressor or current issue identified.;Long Term Goal: Stressors or current issues are controlled or eliminated.;Short Term goal: Identification and review with participant of any Quality of Life or Depression concerns found by scoring the questionnaire.;Long Term goal: The participant improves quality of Life and PHQ9 Scores as seen by post scores and/or verbalization of changes          Quality of Life Scores:  Scores of 19 and below usually indicate a poorer quality of life in these areas.  A difference of  2-3 points is a clinically meaningful difference.  A difference of 2-3 points in the total score of the Quality of  Life Index has been associated with significant improvement in overall quality of life, self-image, physical symptoms, and general health in studies assessing change in quality of life.  PHQ-9: Review Flowsheet  More data exists      09/30/2023 09/06/2019 08/02/2019 07/26/2019 07/20/2019  Depression screen PHQ 2/9  Decreased Interest 2 1 1 1 1   Down, Depressed, Hopeless 2 1 0 1 1  PHQ - 2 Score 4 2 1 2 2   Altered sleeping 2 0 0 0 0  Tired, decreased energy 2 1 1  1 1   Change in appetite 1 0 0 0 0  Feeling bad or failure about yourself  0 0 0 0 0  Trouble concentrating 2 0 0 0 0  Moving slowly or fidgety/restless 1 1 0 0 0  Suicidal thoughts 0 0 0 0 0  PHQ-9 Score 12 4 2 3 3   Difficult doing work/chores Somewhat difficult Not difficult at all Not difficult at all Not difficult at all Somewhat difficult   Interpretation of Total Score  Total Score Depression Severity:  1-4 = Minimal depression, 5-9 = Mild depression, 10-14 = Moderate depression, 15-19 = Moderately severe depression, 20-27 = Severe depression   Psychosocial Evaluation and Intervention:  Psychosocial Evaluation - 09/29/23 1428       Psychosocial Evaluation & Interventions   Interventions Encouraged to exercise with the program and follow exercise prescription;Relaxation education;Stress management education    Comments Awesome states that his depression is there and has a family history of depression. He can look to his spouse for support.    Expected Outcomes Short: Start [facility] to help with mood. Long: Maintain a healthy mental state    Continue Psychosocial Services  Follow up required by staff          Psychosocial Re-Evaluation:   Psychosocial Discharge (Final Psychosocial Re-Evaluation):   Education: Education Goals: Education classes will be provided on a weekly basis, covering required topics. Participant will state understanding/return demonstration of topics presented.  Learning Barriers/Preferences:  Learning Barriers/Preferences - 09/29/23 1426       Learning Barriers/Preferences   Learning Barriers Hearing    Learning Preferences None          General Pulmonary Education Topics:  Infection Prevention: - Provides verbal and written material to individual with discussion of infection control including proper hand washing and proper equipment cleaning during exercise session. Flowsheet Row Pulmonary Rehab from 09/30/2023 in [facility] Cardiac and  Pulmonary Rehab  Date 09/29/23  Educator jh  Instruction Review Code 1- Verbalizes Understanding    Falls Prevention: - Provides verbal and written material to individual with discussion of falls prevention and safety. Flowsheet Row Pulmonary Rehab from 09/30/2023 in [facility] Cardiac and Pulmonary Rehab  Date 09/29/23  Educator jh  Instruction Review Code 1- Verbalizes Understanding    Chronic Lung Disease Review: - Group verbal instruction with posters, models, PowerPoint presentations and videos,  to review new updates, new respiratory medications, new advancements in procedures and treatments. Providing information on websites and 800 numbers for continued self-education. Includes information about supplement oxygen, available portable oxygen systems, continuous and intermittent flow rates, oxygen safety, concentrators, and Medicare reimbursement for oxygen. Explanation of Pulmonary Drugs, including class, frequency, complications, importance of spacers, rinsing mouth after steroid MDI's, and proper cleaning methods for nebulizers. Review of basic lung anatomy and physiology related to function, structure, and complications of lung disease. Review of risk factors. Discussion about methods for diagnosing sleep apnea and types of masks and machines for  OSA. Includes a review of the use of types of environmental controls: home humidity, furnaces, filters, dust mite/pet prevention, HEPA vacuums. Discussion about weather changes, air quality and the benefits of nasal washing. Instruction on Warning signs, infection symptoms, calling MD promptly, preventive modes, and value of vaccinations. Review of effective airway clearance, coughing and/or vibration techniques. Emphasizing that all should Create an Action Plan. Written material provided at class time. Flowsheet Row Cardiac Rehab from 08/30/2019 in [facility] Cardiac and Pulmonary Rehab  Date 08/30/19  Educator jh  Instruction Review Code 1- Verbalizes  Understanding    AED/CPR: - Group verbal and written instruction with the use of models to demonstrate the basic use of the AED with the basic ABC's of resuscitation.    Tests and Procedures:  - Group verbal and visual presentation and models provide information about basic cardiac anatomy and function. Reviews the testing methods done to diagnose heart disease and the outcomes of the test results. Describes the treatment choices: Medical Management, Angioplasty, or Coronary Bypass Surgery for treating various heart conditions including Myocardial Infarction, Angina, Valve Disease, and Cardiac Arrhythmias.  Written material provided at class time.   Medication Safety: - Group verbal and visual instruction to review commonly prescribed medications for heart and lung disease. Reviews the medication, class of the drug, and side effects. Includes the steps to properly store meds and maintain the prescription regimen.  Written material given at graduation.   Other: -Provides group and verbal instruction on various topics (see comments)   Knowledge Questionnaire Score:  Knowledge Questionnaire Score - 09/30/23 1001       Knowledge Questionnaire Score   Pre Score 14/18           Core Components/Risk Factors/Patient Goals at Admission:  Personal Goals and Risk Factors at Admission - 09/29/23 1425       Core Components/Risk Factors/Patient Goals on Admission    Weight Management Yes;Weight Loss    Intervention Weight Management: Develop a combined nutrition and exercise program designed to reach desired caloric intake, while maintaining appropriate intake of nutrient and fiber, sodium and fats, and appropriate energy expenditure required for the weight goal.;Weight Management: Provide education and appropriate resources to help participant work on and attain dietary goals.;Weight Management/Obesity: Establish reasonable short term and long term weight goals.    Expected Outcomes Short  Term: Continue to assess and modify interventions until short term weight is achieved;Long Term: Adherence to nutrition and physical activity/exercise program aimed toward attainment of established weight goal;Weight Loss: Understanding of general recommendations for a balanced deficit meal plan, which promotes 1-2 lb weight loss per week and includes a negative energy balance of [phone] kcal/d;Understanding recommendations for meals to include 15-35% energy as protein, 25-35% energy from fat, 35-60% energy from carbohydrates, less than 200mg  of dietary cholesterol, 20-35 gm of total fiber daily;Understanding of distribution of calorie intake throughout the day with the consumption of 4-5 meals/snacks    Improve shortness of breath with ADL's Yes    Intervention Provide education, individualized exercise plan and daily activity instruction to help decrease symptoms of SOB with activities of daily living.    Expected Outcomes Short Term: Improve cardiorespiratory fitness to achieve a reduction of symptoms when performing ADLs;Long Term: Be able to perform more ADLs without symptoms or delay the onset of symptoms    Heart Failure Yes    Intervention Provide a combined exercise and nutrition program that is supplemented with education, support and counseling about heart failure. Directed toward relieving symptoms  such as shortness of breath, decreased exercise tolerance, and extremity edema.    Expected Outcomes Improve functional capacity of life;Short term: Attendance in program 2-3 days a week with increased exercise capacity. Reported lower sodium intake. Reported increased fruit and vegetable intake. Reports medication compliance.;Short term: Daily weights obtained and reported for increase. Utilizing diuretic protocols set by physician.;Long term: Adoption of self-care skills and reduction of barriers for early signs and symptoms recognition and intervention leading to self-care maintenance.     Hypertension Yes    Intervention Provide education on lifestyle modifcations including regular physical activity/exercise, weight management, moderate sodium restriction and increased consumption of fresh fruit, vegetables, and low fat dairy, alcohol moderation, and smoking cessation.;Monitor prescription use compliance.    Expected Outcomes Short Term: Continued assessment and intervention until BP is < 140/66mm HG in hypertensive participants. < 130/5mm HG in hypertensive participants with diabetes, heart failure or chronic kidney disease.;Long Term: Maintenance of blood pressure at goal levels.    Lipids Yes    Intervention Provide education and support for participant on nutrition & aerobic/resistive exercise along with prescribed medications to achieve LDL 70mg , HDL >40mg .    Expected Outcomes Short Term: Participant states understanding of desired cholesterol values and is compliant with medications prescribed. Participant is following exercise prescription and nutrition guidelines.;Long Term: Cholesterol controlled with medications as prescribed, with individualized exercise RX and with personalized nutrition plan. Value goals: LDL < 70mg , HDL > 40 mg.          Education:Diabetes - Individual verbal and written instruction to review signs/symptoms of diabetes, desired ranges of glucose level fasting, after meals and with exercise. Acknowledge that pre and post exercise glucose checks will be done for 3 sessions at entry of program.   Know Your Numbers and Heart Failure: - Group verbal and visual instruction to discuss disease risk factors for cardiac and pulmonary disease and treatment options.  Reviews associated critical values for Overweight/Obesity, Hypertension, Cholesterol, and Diabetes.  Discusses basics of heart failure: signs/symptoms and treatments.  Introduces Heart Failure Zone chart for action plan for heart failure. Written material provided at class time. Flowsheet Row  Pulmonary Rehab from 09/30/2023 in [facility] Cardiac and Pulmonary Rehab  Education need identified 09/30/23    Core Components/Risk Factors/Patient Goals Review:    Core Components/Risk Factors/Patient Goals at Discharge (Final Review):    ITP Comments:  ITP Comments     Row Name 09/29/23 1431 09/30/23 0957 10/06/23 1054       ITP Comments Virtual Visit completed. Patient informed on EP and RD appointment and 6 Minute walk test. Patient also informed of patient health questionnaires on My Chart. Patient Verbalizes understanding. Visit diagnosis can be found in [facility] 06/03/2023. Completed and gym orientation for respiratory care services. Initial ITP created and sent for review to Dr. Faud Aleskerov, Medical Director. 30 Day review completed. Medical Director ITP review done; changes made as directed and signed approval by Medical Director. New to program.        Comments: 30 day review

## 2023-10-06 NOTE — Progress Notes (Signed)
 Daily Session Note  Patient Details  Name: Chase Boyd MRN: [id number] Date of Birth: November 04, 1945 Referring Provider:   Flowsheet Row Pulmonary Rehab from 09/30/2023 in [facility] Cardiac and Pulmonary Rehab  Referring Provider Dr. Prentice Flatten, MD    Encounter Date: 10/06/2023  Check In:  Session Check In - 10/06/23 1616       Check-In   Supervising physician immediately available to respond to emergencies See telemetry face sheet for immediately available ER MD    Location [facility]-Cardiac & Pulmonary Rehab    Staff Present Burnard Hint BS, ACSM CEP, Exercise Physiologist;Joseph Rolinda RCP,RRT,BSRT;Amberlin Utke RN,BSN    Virtual Visit No    Medication changes reported     No    Fall or balance concerns reported    No    Tobacco Cessation No Change    Warm-up and Cool-down Performed on first and last piece of equipment    Resistance Training Performed Yes    VAD Patient? No    PAD/SET Patient? No      Pain Assessment   Currently in Pain? No/denies             Social History   Tobacco Use  Smoking Status Never  Smokeless Tobacco Never    Goals Met:  Proper associated with RPD/PD & O2 Sat Independence with exercise equipment Using PLB without cueing & demonstrates good technique Exercise tolerated well No report of concerns or symptoms today Strength training completed today  Goals Unmet:  Not Applicable  Comments: First full day of exercise!  Patient was oriented to gym and equipment including functions, settings, policies, and procedures.  Patient's individual exercise prescription and treatment plan were reviewed.  All starting workloads were established based on the results of the 6 minute walk test done at initial orientation visit.  The plan for exercise progression was also introduced and progression will be customized based on patient's performance and goals.    Dr. Oneil Pinal is Medical Director for [facility] Cardiac Rehabilitation.  Dr. Fuad Aleskerov is  Medical Director for [facility] Pulmonary Rehabilitation.

## 2023-10-08 ENCOUNTER — Encounter

## 2023-10-08 DIAGNOSIS — I5042 Chronic combined systolic (congestive) and diastolic (congestive) heart failure: Secondary | ICD-10-CM | POA: Diagnosis not present

## 2023-10-08 NOTE — Progress Notes (Signed)
 Daily Session Note  Patient Details  Name: Chase Boyd MRN: [id number] Date of Birth: 01/02/1946 Referring Provider:   Flowsheet Row Pulmonary Rehab from 09/30/2023 in [facility] Cardiac and Pulmonary Rehab  Referring Provider Dr. Prentice Flatten, MD    Encounter Date: 10/08/2023  Check In:  Session Check In - 10/08/23 1110       Check-In   Supervising physician immediately available to respond to emergencies See telemetry face sheet for immediately available ER MD    Location [facility]-Cardiac & Pulmonary Rehab    Staff Present Burnard Davenport RN,BSN,MPA;Joseph Rolinda RCP,RRT,BSRT;Noah Tickle, [place], Exercise Physiologist    Virtual Visit No    Medication changes reported     No    Fall or balance concerns reported    No    Tobacco Cessation No Change    Warm-up and Cool-down Performed on first and last piece of equipment    Resistance Training Performed Yes    VAD Patient? No    PAD/SET Patient? No      Pain Assessment   Currently in Pain? No/denies             Social History   Tobacco Use  Smoking Status Never  Smokeless Tobacco Never    Goals Met:  Proper associated with RPD/PD & O2 Sat Independence with exercise equipment Using PLB without cueing & demonstrates good technique Exercise tolerated well No report of concerns or symptoms today Strength training completed today  Goals Unmet:  Not Applicable  Comments: Pt able to follow exercise prescription today without complaint.  Will continue to monitor for progression.    Dr. Oneil Pinal is Medical Director for [facility] Cardiac Rehabilitation.  Dr. Fuad Aleskerov is Medical Director for [facility] Pulmonary Rehabilitation.

## 2023-10-13 ENCOUNTER — Encounter

## 2023-10-13 ENCOUNTER — Encounter: Attending: Internal Medicine | Admitting: Emergency Medicine

## 2023-10-13 DIAGNOSIS — I5042 Chronic combined systolic (congestive) and diastolic (congestive) heart failure: Secondary | ICD-10-CM | POA: Insufficient documentation

## 2023-10-13 NOTE — Progress Notes (Signed)
 Daily Session Note  Patient Details  Name: Chase Boyd MRN: [id number] Date of Birth: 1945-08-10 Referring Provider:   Flowsheet Row Pulmonary Rehab from 09/30/2023 in [facility] Cardiac and Pulmonary Rehab  Referring Provider Dr. Prentice Flatten, MD    Encounter Date: 10/13/2023  Check In:  Session Check In - 10/13/23 1126       Check-In   Supervising physician immediately available to respond to emergencies See telemetry face sheet for immediately available ER MD    Location [facility]-Cardiac & Pulmonary Rehab    Staff Present Burnard Hint BS, ACSM CEP, Exercise Physiologist;Joseph Rolinda RCP,RRT,BSRT;Mar Walmer RN,BSN;Meredith Tressa RN,BSN    Virtual Visit No    Medication changes reported     No    Fall or balance concerns reported    No    Tobacco Cessation No Change    Warm-up and Cool-down Performed on first and last piece of equipment    Resistance Training Performed Yes    VAD Patient? No    PAD/SET Patient? No      Pain Assessment   Currently in Pain? No/denies             Social History   Tobacco Use  Smoking Status Never  Smokeless Tobacco Never    Goals Met:  Proper associated with RPD/PD & O2 Sat Independence with exercise equipment Using PLB without cueing & demonstrates good technique Exercise tolerated well No report of concerns or symptoms today Strength training completed today  Goals Unmet:  Not Applicable  Comments: Pt able to follow exercise prescription today without complaint.  Will continue to monitor for progression.    Dr. Oneil Pinal is Medical Director for [facility] Cardiac Rehabilitation.  Dr. Fuad Aleskerov is Medical Director for [facility] Pulmonary Rehabilitation.

## 2023-10-15 ENCOUNTER — Encounter: Admitting: *Deleted

## 2023-10-15 DIAGNOSIS — I5042 Chronic combined systolic (congestive) and diastolic (congestive) heart failure: Secondary | ICD-10-CM | POA: Diagnosis not present

## 2023-10-15 NOTE — Progress Notes (Signed)
 Daily Session Note  Patient Details  Name: Chase Boyd MRN: [id number] Date of Birth: 07-03-1945 Referring Provider:   Flowsheet Row Pulmonary Rehab from 09/30/2023 in [facility] Cardiac and Pulmonary Rehab  Referring Provider Dr. Prentice Flatten, MD    Encounter Date: 10/15/2023  Check In:  Session Check In - 10/15/23 1052       Check-In   Supervising physician immediately available to respond to emergencies See telemetry face sheet for immediately available ER MD    Location [facility]-Cardiac & Pulmonary Rehab    Staff Present Hoy Rodney RN,BSN;Noah Tickle, BS, Exercise Physiologist;Maxon Conetta BS, Exercise Physiologist    Virtual Visit No    Medication changes reported     No    Fall or balance concerns reported    No    Warm-up and Cool-down Performed on first and last piece of equipment    Resistance Training Performed Yes    VAD Patient? No    PAD/SET Patient? No      Pain Assessment   Currently in Pain? No/denies             Social History   Tobacco Use  Smoking Status Never  Smokeless Tobacco Never    Goals Met:  Independence with exercise equipment Exercise tolerated well No report of concerns or symptoms today Strength training completed today  Goals Unmet:  Not Applicable  Comments: Pt able to follow exercise prescription today without complaint.  Will continue to monitor for progression.    Dr. Oneil Pinal is Medical Director for [facility] Cardiac Rehabilitation.  Dr. Fuad Aleskerov is Medical Director for [facility] Pulmonary Rehabilitation.

## 2023-10-18 ENCOUNTER — Encounter

## 2023-10-18 DIAGNOSIS — I5042 Chronic combined systolic (congestive) and diastolic (congestive) heart failure: Secondary | ICD-10-CM

## 2023-10-18 NOTE — Progress Notes (Signed)
 Daily Session Note  Patient Details  Name: Chase Boyd MRN: [id number] Date of Birth: Oct 09, 1945 Referring Provider:   Flowsheet Row Pulmonary Rehab from 09/30/2023 in [facility] Cardiac and Pulmonary Rehab  Referring Provider Dr. Prentice Flatten, MD    Encounter Date: 10/18/2023  Check In:  Session Check In - 10/18/23 1112       Check-In   Supervising physician immediately available to respond to emergencies See telemetry face sheet for immediately available ER MD    Location [facility]-Cardiac & Pulmonary Rehab    Staff Present Burnard Davenport RN,BSN,MPA;Joseph Hood RCP,RRT,BSRT;Maxon Burnell BS, Exercise Physiologist;Naveen Clardy Dyane HECKLE, ACSM CEP, Exercise Physiologist;Jason Elnor RDN,LDN    Virtual Visit No    Medication changes reported     No    Fall or balance concerns reported    No    Tobacco Cessation No Change    Warm-up and Cool-down Performed on first and last piece of equipment    Resistance Training Performed Yes    VAD Patient? No    PAD/SET Patient? No      Pain Assessment   Currently in Pain? No/denies             Social History   Tobacco Use  Smoking Status Never  Smokeless Tobacco Never    Goals Met:  Proper associated with RPD/PD & O2 Sat Independence with exercise equipment Using PLB without cueing & demonstrates good technique Exercise tolerated well No report of concerns or symptoms today Strength training completed today  Goals Unmet:  Not Applicable  Comments: Pt able to follow exercise prescription today without complaint.  Will continue to monitor for progression.    Dr. Oneil Pinal is Medical Director for [facility] Cardiac Rehabilitation.  Dr. Fuad Aleskerov is Medical Director for [facility] Pulmonary Rehabilitation.

## 2023-10-20 ENCOUNTER — Encounter: Admitting: Emergency Medicine

## 2023-10-20 ENCOUNTER — Encounter

## 2023-10-20 DIAGNOSIS — I5042 Chronic combined systolic (congestive) and diastolic (congestive) heart failure: Secondary | ICD-10-CM

## 2023-10-20 NOTE — Progress Notes (Signed)
 Daily Session Note  Patient Details  Name: Chase Boyd MRN: [id number] Date of Birth: 09-04-1945 Referring Provider:   Flowsheet Row Pulmonary Rehab from 09/30/2023 in [facility] Cardiac and Pulmonary Rehab  Referring Provider Dr. Prentice Flatten, MD    Encounter Date: 10/20/2023  Check In:  Session Check In - 10/20/23 1105       Check-In   Supervising physician immediately available to respond to emergencies See telemetry face sheet for immediately available ER MD    Location [facility]-Cardiac & Pulmonary Rehab    Staff Present Rollene Paterson, MS, Exercise Physiologist;Maxon Conetta BS, Exercise Physiologist;Joseph Rolinda RCP,RRT,BSRT;Keliyah Spillman RN,BSN    Virtual Visit No    Medication changes reported     No    Fall or balance concerns reported    No    Warm-up and Cool-down Performed on first and last piece of equipment    Resistance Training Performed Yes    VAD Patient? No    PAD/SET Patient? No      Pain Assessment   Currently in Pain? No/denies             Social History   Tobacco Use  Smoking Status Never  Smokeless Tobacco Never    Goals Met:  Proper associated with RPD/PD & O2 Sat Independence with exercise equipment Using PLB without cueing & demonstrates good technique Exercise tolerated well No report of concerns or symptoms today Strength training completed today  Goals Unmet:  Not Applicable  Comments: Pt able to follow exercise prescription today without complaint.  Will continue to monitor for progression.    Dr. Oneil Pinal is Medical Director for [facility] Cardiac Rehabilitation.  Dr. Fuad Aleskerov is Medical Director for [facility] Pulmonary Rehabilitation.

## 2023-10-22 ENCOUNTER — Encounter

## 2023-10-22 DIAGNOSIS — I5042 Chronic combined systolic (congestive) and diastolic (congestive) heart failure: Secondary | ICD-10-CM

## 2023-10-22 NOTE — Progress Notes (Signed)
 Daily Session Note  Patient Details  Name: Chase Boyd MRN: [id number] Date of Birth: 1946-01-19 Referring Provider:   Flowsheet Row Pulmonary Rehab from 09/30/2023 in [facility] Cardiac and Pulmonary Rehab  Referring Provider Dr. Prentice Flatten, MD    Encounter Date: 10/22/2023  Check In:  Session Check In - 10/22/23 1055       Check-In   Supervising physician immediately available to respond to emergencies See telemetry face sheet for immediately available ER MD    Location [facility]-Cardiac & Pulmonary Rehab    Staff Present Burnard Davenport RN,BSN,MPA;Maxon Conetta BS, Exercise Physiologist;Joseph Hood RCP,RRT,BSRT;Noah Tickle, BS, Exercise Physiologist    Virtual Visit No    Medication changes reported     No    Fall or balance concerns reported    No    Tobacco Cessation No Change    Warm-up and Cool-down Performed on first and last piece of equipment    Resistance Training Performed Yes    VAD Patient? No    PAD/SET Patient? No      Pain Assessment   Currently in Pain? No/denies             Social History   Tobacco Use  Smoking Status Never  Smokeless Tobacco Never    Goals Met:  Independence with exercise equipment Exercise tolerated well No report of concerns or symptoms today Strength training completed today  Goals Unmet:  Not Applicable  Comments: Pt able to follow exercise prescription today without complaint.  Will continue to monitor for progression.    Dr. Oneil Pinal is Medical Director for [facility] Cardiac Rehabilitation.  Dr. Fuad Aleskerov is Medical Director for [facility] Pulmonary Rehabilitation.

## 2023-10-25 ENCOUNTER — Encounter

## 2023-10-25 DIAGNOSIS — I5042 Chronic combined systolic (congestive) and diastolic (congestive) heart failure: Secondary | ICD-10-CM | POA: Diagnosis not present

## 2023-10-25 NOTE — Progress Notes (Signed)
 Daily Session Note  Patient Details  Name: Chase Boyd MRN: [id number] Date of Birth: [birth year] Referring Provider:   Flowsheet Row Pulmonary Rehab from 09/30/2023 in [facility] Cardiac and Pulmonary Rehab  Referring Provider Dr. Prentice Flatten, MD    Encounter Date: 10/25/2023  Check In:  Session Check In - 10/25/23 1108       Check-In   Supervising physician immediately available to respond to emergencies See telemetry face sheet for immediately available ER MD    Location [facility]-Cardiac & Pulmonary Rehab    Staff Present Burnard Davenport RN,BSN,MPA;Joseph Rolinda RCP,RRT,BSRT;Laura Cates RN,BSN;Jesus Poplin Dyane BS, ACSM CEP, Exercise Physiologist    Virtual Visit No    Medication changes reported     No    Fall or balance concerns reported    No    Tobacco Cessation No Change    Warm-up and Cool-down Performed on first and last piece of equipment    Resistance Training Performed Yes    VAD Patient? No    PAD/SET Patient? No      Pain Assessment   Currently in Pain? No/denies             Social History   Tobacco Use  Smoking Status Never  Smokeless Tobacco Never    Goals Met:  Proper associated with RPD/PD & O2 Sat Independence with exercise equipment Using PLB without cueing & demonstrates good technique Exercise tolerated well No report of concerns or symptoms today Strength training completed today  Goals Unmet:  Not Applicable  Comments: Pt able to follow exercise prescription today without complaint.  Will continue to monitor for progression.    Dr. Oneil Pinal is Medical Director for [facility] Cardiac Rehabilitation.  Dr. Fuad Aleskerov is Medical Director for [facility] Pulmonary Rehabilitation.

## 2023-10-27 ENCOUNTER — Encounter

## 2023-10-27 DIAGNOSIS — I5042 Chronic combined systolic (congestive) and diastolic (congestive) heart failure: Secondary | ICD-10-CM

## 2023-10-27 NOTE — Progress Notes (Signed)
 Daily Session Note  Patient Details  Name: Chase Boyd MRN: [id number] Date of Birth: [birth year] Referring Provider:   Flowsheet Row Pulmonary Rehab from 09/30/2023 in [facility] Cardiac and Pulmonary Rehab  Referring Provider Dr. Prentice Flatten, MD    Encounter Date: 10/27/2023  Check In:  Session Check In - 10/27/23 1103       Check-In   Supervising physician immediately available to respond to emergencies See telemetry face sheet for immediately available ER MD    Location [facility]-Cardiac & Pulmonary Rehab    Staff Present Burnard Davenport RN,BSN,MPA;Joseph [facility] RCP,RRT,BSRT;Margaret Best, MS, Exercise Physiologist;Jason Elnor RDN,LDN    Virtual Visit No    Medication changes reported     No    Fall or balance concerns reported    No    Tobacco Cessation No Change    Warm-up and Cool-down Performed on first and last piece of equipment    Resistance Training Performed Yes    VAD Patient? No    PAD/SET Patient? No      Pain Assessment   Currently in Pain? No/denies             Social History   Tobacco Use  Smoking Status Never  Smokeless Tobacco Never    Goals Met:  Proper associated with RPD/PD & O2 Sat Independence with exercise equipment Using PLB without cueing & demonstrates good technique Exercise tolerated well No report of concerns or symptoms today Strength training completed today  Goals Unmet:  Not Applicable  Comments: Pt able to follow exercise prescription today without complaint.  Will continue to monitor for progression.    Dr. Oneil Pinal is Medical Director for [facility] Cardiac Rehabilitation.  Dr. Fuad Aleskerov is Medical Director for [facility] Pulmonary Rehabilitation.

## 2023-10-29 ENCOUNTER — Encounter: Admitting: Emergency Medicine

## 2023-10-29 DIAGNOSIS — I5042 Chronic combined systolic (congestive) and diastolic (congestive) heart failure: Secondary | ICD-10-CM | POA: Diagnosis not present

## 2023-10-29 NOTE — Progress Notes (Signed)
 Daily Session Note  Patient Details  Name: Chase Boyd MRN: [id number] Date of Birth: Sep 24, 1945 Referring Provider:   Flowsheet Row Pulmonary Rehab from 09/30/2023 in [facility] Cardiac and Pulmonary Rehab  Referring Provider Dr. Prentice Flatten, MD    Encounter Date: 10/29/2023  Check In:  Session Check In - 10/29/23 1108       Check-In   Supervising physician immediately available to respond to emergencies See telemetry face sheet for immediately available ER MD    Location [facility]-Cardiac & Pulmonary Rehab    Staff Present Rollene Paterson, MS, Exercise Physiologist;Noah Tickle, BS, Exercise Physiologist;Demiana Crumbley RN,BSN;Joseph Rolinda RCP,RRT,BSRT    Virtual Visit No    Medication changes reported     No    Fall or balance concerns reported    No    Tobacco Cessation No Change    Warm-up and Cool-down Performed on first and last piece of equipment    Resistance Training Performed Yes    VAD Patient? No    PAD/SET Patient? No      Pain Assessment   Currently in Pain? No/denies             Social History   Tobacco Use  Smoking Status Never  Smokeless Tobacco Never    Goals Met:  Proper associated with RPD/PD & O2 Sat Independence with exercise equipment Using PLB without cueing & demonstrates good technique Exercise tolerated well No report of concerns or symptoms today Strength training completed today  Goals Unmet:  Not Applicable  Comments: Pt able to follow exercise prescription today without complaint.  Will continue to monitor for progression.    Dr. Oneil Pinal is Medical Director for [facility] Cardiac Rehabilitation.  Dr. Fuad Aleskerov is Medical Director for [facility] Pulmonary Rehabilitation.

## 2023-11-01 ENCOUNTER — Encounter

## 2023-11-01 DIAGNOSIS — I5042 Chronic combined systolic (congestive) and diastolic (congestive) heart failure: Secondary | ICD-10-CM | POA: Diagnosis not present

## 2023-11-01 NOTE — Progress Notes (Signed)
 Daily Session Note  Patient Details  Name: Chase Boyd MRN: [id number] Date of Birth: Jan 27, 1946 Referring Provider:   Flowsheet Row Pulmonary Rehab from 09/30/2023 in [facility] Card[facility] Pul[facility] Rehab  Referring Provider Dr. Prentice Flatten, MD    Encounter Date: 11/01/2023  Check In:  Session Check In - 11/01/23 1140       Check-In   Supervising physician immediately available to respond to emergencies See telemetry face sheet for immediately available ER MD    Location [facility]-Cardiac & Pulmonary Rehab    Staff Present Burnard Davenport RN,BSN,MPA;Joseph Hood RCP,RRT,BSRT;Maxon Burnell BS, Exercise Physiologist;Lenice Koper Dyane BS, ACSM CEP, Exercise Physiologist;Laura Cates RN,BSN    Virtual Visit No    Medication changes reported     No    Fall or balance concerns reported    No    Tobacco Cessation No Change    Warm-up and Cool-down Performed on first and last piece of equipment    Resistance Training Performed Yes    VAD Patient? No    PAD/SET Patient? No      Pain Assessment   Currently in Pain? No/denies             Social History   Tobacco Use  Smoking Status Never  Smokeless Tobacco Never    Goals Met:  Proper associated with RPD/PD & O2 Sat Independence with exercise equipment Using PLB without cueing & demonstrates good technique Exercise tolerated well No report of concerns or symptoms today Strength training completed today  Goals Unmet:  Not Applicable  Comments: Pt able to follow exercise prescription today without complaint.  Will continue to monitor for progression.    Dr. Oneil Pinal is Medical Director for [facility] Cardiac Rehabilitation.  Dr. Fuad Aleskerov is Medical Director for [facility] Pulmonary Rehabilitation.

## 2023-11-03 ENCOUNTER — Encounter: Admitting: Emergency Medicine

## 2023-11-03 ENCOUNTER — Encounter

## 2023-11-03 DIAGNOSIS — I5042 Chronic combined systolic (congestive) and diastolic (congestive) heart failure: Secondary | ICD-10-CM

## 2023-11-03 NOTE — Progress Notes (Signed)
 Daily Session Note  Patient Details  Name: Chase Boyd MRN: [id number] Date of Birth: 10/25/1945 Referring Provider:   Flowsheet Row Pulmonary Rehab from 09/30/2023 in [facility] Card[facility] Pulmonary Rehab  Referring Provider Dr. Prentice Flatten, MD    Encounter Date: 11/03/2023  Check In:  Session Check In - 11/03/23 1101       Check-In   Supervising physician immediately available to respond to emergencies See telemetry face sheet for immediately available ER MD    Location [facility]-Cardiac & Pulmonary Rehab    Staff Present Leita Franks RN,BSN;Mary Godley, RN, DNP, NE-BC;Joseph Hood RCP,RRT,BSRT;Margaret Best, MS, Exercise Physiologist;Maxon Conetta BS, Exercise Physiologist    Virtual Visit No    Medication changes reported     No    Fall or balance concerns reported    No    Tobacco Cessation No Change    Warm-up and Cool-down Performed on first and last piece of equipment    Resistance Training Performed Yes    VAD Patient? No    PAD/SET Patient? No      Pain Assessment   Currently in Pain? No/denies             Social History   Tobacco Use  Smoking Status Never  Smokeless Tobacco Never    Goals Met:  Proper associated with RPD/PD & O2 Sat Independence with exercise equipment Using PLB without cueing & demonstrates good technique Exercise tolerated well No report of concerns or symptoms today Strength training completed today  Goals Unmet:  Not Applicable  Comments: Pt able to follow exercise prescription today without complaint.  Will continue to monitor for progression.    Dr. Oneil Pinal is Medical Director for [facility] Cardiac Rehabilitation.  Dr. Fuad Aleskerov is Medical Director for [facility] Pulmonary Rehabilitation.

## 2023-11-03 NOTE — Progress Notes (Signed)
 Pulmonary Individual Treatment Plan  Patient Details  Name: Chase Boyd MRN: [id number] Date of Birth: 1945-04-24 Referring Provider:   Flowsheet Row Pulmonary Rehab from 09/30/2023 in [facility] Cardiac and Pulmonary Rehab  Referring Provider Dr. Prentice Flatten, MD    Initial Encounter Date:  Flowsheet Row Pulmonary Rehab from 09/30/2023 in [facility] Cardiac and Pulmonary Rehab  Date 09/30/23    Visit Diagnosis: Heart failure, systolic and diastolic, chronic (HCC)  Patient's Home Medications on Admission:  Current Outpatient Medications:    amiodarone (PACERONE) 200 MG tablet, Take by mouth. (Patient not taking: Reported on 09/29/2023), Disp: , Rfl:    amLODipine (NORVASC) 5 MG tablet, Take by mouth. (Patient not taking: Reported on 09/29/2023), Disp: , Rfl:    amLODipine (NORVASC) 5 MG tablet, Take 5 mg by mouth daily., Disp: , Rfl:    amoxicillin (AMOXIL) 500 MG capsule, TAKE 4 CAPSULES (2,000MG  TOTAL) BY MOUTH 1 HOUR PRIOR TO DENTIST, Disp: , Rfl:    apixaban (ELIQUIS) 5 MG TABS tablet, Take by mouth. (Patient not taking: Reported on 09/29/2023), Disp: , Rfl:    apixaban (ELIQUIS) 5 MG TABS tablet, Take 5 mg by mouth., Disp: , Rfl:    ARIPiprazole (ABILIFY) 10 MG tablet, Take 10 mg by mouth., Disp: , Rfl:    buPROPion (WELLBUTRIN XL) 150 MG 24 hr tablet, Take by mouth., Disp: , Rfl:    buPROPion (WELLBUTRIN XL) 300 MG 24 hr tablet, Take 300 mg by mouth daily., Disp: , Rfl:    colestipol (COLESTID) 1 g tablet, Take 2 g by mouth., Disp: , Rfl:    escitalopram (LEXAPRO) 20 MG tablet, Take by mouth. (Patient not taking: Reported on 09/29/2023), Disp: , Rfl:    finasteride (PROSCAR) 5 MG tablet, Take 1 tablet PO daily (Patient not taking: Reported on 09/29/2023), Disp: , Rfl:    finasteride (PROSCAR) 5 MG tablet, Take 5 mg by mouth daily., Disp: , Rfl:    furosemide (LASIX) 20 MG tablet, Take by mouth., Disp: , Rfl:    metoprolol succinate (TOPROL-XL) 25 MG 24 hr tablet, Take 25 mg by mouth  daily., Disp: , Rfl:    Multiple Vitamin (MULTI-VITAMIN) tablet, Take by mouth., Disp: , Rfl:    Multiple Vitamins-Minerals (ONE DAILY CALCIUM/IRON) TABS, Take by mouth. (Patient not taking: Reported on 09/29/2023), Disp: , Rfl:    PARoxetine (PAXIL) 30 MG tablet, Take 30 mg by mouth., Disp: , Rfl:    pravastatin (PRAVACHOL) 20 MG tablet, Take 20 mg by mouth., Disp: , Rfl:    tamsulosin (FLOMAX) 0.4 MG CAPS capsule, Take 0.4 mg by mouth., Disp: , Rfl:   Past Medical History: No past medical history on file.  Tobacco Use: Social History   Tobacco Use  Smoking Status Never  Smokeless Tobacco Never    Labs: Review Flowsheet        No data to display           Pulmonary Assessment Scores:  Pulmonary Assessment Scores     Row Name 09/30/23 0959         ADL UCSD   ADL Phase Entry     SOB Score total 31     Rest 1     Walk 1     Stairs 2     Bath 0     Dress 0     Shop 1       CAT Score   CAT Score 14       mMRC Score  mMRC Score 2        UCSD: Self-administered rating of dyspnea associated with activities of daily living (ADLs) 6-point scale (0 = not at all to 5 = maximal or unable to do because of breathlessness)  Scoring Scores range from 0 to 120.  Minimally important difference is 5 units  CAT: CAT can identify the health impairment of COPD patients and is better correlated with disease progression.  CAT has a scoring range of zero to 40. The CAT score is classified into four groups of low (less than 10), medium (10 - 20), high (21-30) and very high (31-40) based on the impact level of disease on health status. A CAT score over 10 suggests significant symptoms.  A worsening CAT score could be explained by an exacerbation, poor medication adherence, poor inhaler technique, or progression of COPD or comorbid conditions.  CAT MCID is 2 points  mMRC: mMRC (Modified Medical Research Council) Dyspnea Scale is used to assess the degree of baseline functional  disability in patients of respiratory disease due to dyspnea. No minimal important difference is established. A decrease in score of 1 point or greater is considered a positive change.   Pulmonary Function Assessment:   Exercise Target Goals: Exercise Program Goal: Individual exercise prescription set using results from initial 6 min walk test and THRR while considering  patient's activity barriers and safety.   Exercise Prescription Goal: Initial exercise prescription builds to 30-45 minutes a day of aerobic activity, 2-3 days per week.  Home exercise guidelines will be given to patient during program as part of exercise prescription that the participant will acknowledge.  Education: Aerobic Exercise: - Group verbal and visual presentation on the components of exercise prescription. Introduces F.I.T.T principle from ACSM for exercise prescriptions.  Reviews F.I.T.T. principles of aerobic exercise including progression. Written material provided at class time.   Education: Resistance Exercise: - Group verbal and visual presentation on the components of exercise prescription. Introduces F.I.T.T principle from ACSM for exercise prescriptions  Reviews F.I.T.T. principles of resistance exercise including progression. Written material provided at class time.    Education: Exercise & Equipment Safety: - Individual verbal instruction and demonstration of equipment use and safety with use of the equipment. Flowsheet Row Pulmonary Rehab from 09/30/2023 in [facility] Cardiac and Pulmonary Rehab  Date 09/29/23  Educator jh  Instruction Review Code 1- Verbalizes Understanding    Education: Exercise Physiology & General Exercise Guidelines: - Group verbal and written instruction with models to review the exercise physiology of the cardiovascular system and associated critical values. Provides general exercise guidelines with specific guidelines to those with heart or lung disease.    Education:  Flexibility, Balance, Mind/Body Relaxation: - Group verbal and visual presentation with interactive activity on the components of exercise prescription. Introduces F.I.T.T principle from ACSM for exercise prescriptions. Reviews F.I.T.T. principles of flexibility and balance exercise training including progression. Also discusses the mind body connection.  Reviews various relaxation techniques to help reduce and manage stress (i.e. Deep breathing, progressive muscle relaxation, and visualization). Balance handout provided to take home. Written material provided at class time.   Activity Barriers & Risk Stratification:  Activity Barriers & Cardiac Risk Stratification - 09/30/23 1020       Activity Barriers & Cardiac Risk Stratification   Activity Barriers Joint Problems;Deconditioning;Balance Concerns;Muscular Weakness          6 Minute Walk:  6 Minute Walk     Row Name 09/30/23 1018  6 Minute Walk   Phase Initial     Distance 1020 feet     Walk Time 6 minutes     # of Rest Breaks 0     MPH 1.93     METS 1.96     RPE 12     Perceived Dyspnea  1     VO2 Peak 6.87     Symptoms No     Resting HR 78 bpm     Resting BP 114/68     Resting Oxygen Saturation  97 %     Exercise Oxygen Saturation  during 6 min walk 93 %     Max Ex. HR 101 bpm     Max Ex. BP 134/82     2 Minute Post BP 128/78       Interval HR   1 Minute HR 93     2 Minute HR 94     3 Minute HR 97     4 Minute HR 97     5 Minute HR 97     6 Minute HR 101     2 Minute Post HR 86     Interval Heart Rate? Yes       Interval Oxygen   Interval Oxygen? Yes     Baseline Oxygen Saturation % 97 %     1 Minute Oxygen Saturation % 93 %     1 Minute Liters of Oxygen 0 L  RA     2 Minute Oxygen Saturation % 96 %     2 Minute Liters of Oxygen 0 L     3 Minute Oxygen Saturation % 96 %     3 Minute Liters of Oxygen 0 L     4 Minute Oxygen Saturation % 96 %     4 Minute Liters of Oxygen 0 L     5 Minute Oxygen  Saturation % 93 %     5 Minute Liters of Oxygen 0 L     6 Minute Oxygen Saturation % 96 %     6 Minute Liters of Oxygen 0 L     2 Minute Post Oxygen Saturation % 97 %     2 Minute Post Liters of Oxygen 0 L       Oxygen Initial Assessment:   Oxygen Re-Evaluation:  Oxygen Re-Evaluation     Row Name 10/06/23 1620 11/01/23 1100           Program Oxygen Prescription   Program Oxygen Prescription -- None        Home Oxygen   Home Oxygen Device -- None      Sleep Oxygen Prescription -- None      Home Exercise Oxygen Prescription -- None      Home Resting Oxygen Prescription -- None        Goals/Expected Outcomes   Short Term Goals -- To learn and demonstrate proper pursed lip breathing techniques or other breathing techniques.       Long  Term Goals -- Exhibits proper breathing techniques, such as pursed lip breathing or other method taught during program session      Comments Reviewed PLB technique with pt.  Talked about how it works and it's importance in maintaining their exercise saturations. Informed patient how to perform the Pursed Lipped breathing technique. Told patient to Inhale through the nose and out the mouth with pursed lips to keep their airways open, help oxygenate them better, practice when at rest or  doing strenuous activity. Patient Verbalizes understanding of technique and will work on and be reiterated during [facility].      Goals/Expected Outcomes Short: Become more profiecient at using PLB.   Long: Become independent at using PLB. Short: use PLB with exertion. Long: use PLB on exertion proficiently and independently.         Oxygen Discharge (Final Oxygen Re-Evaluation):  Oxygen Re-Evaluation - 11/01/23 1100       Program Oxygen Prescription   Program Oxygen Prescription None      Home Oxygen   Home Oxygen Device None    Sleep Oxygen Prescription None    Home Exercise Oxygen Prescription None    Home Resting Oxygen Prescription None       Goals/Expected Outcomes   Short Term Goals To learn and demonstrate proper pursed lip breathing techniques or other breathing techniques.     Long  Term Goals Exhibits proper breathing techniques, such as pursed lip breathing or other method taught during program session    Comments Informed patient how to perform the Pursed Lipped breathing technique. Told patient to Inhale through the nose and out the mouth with pursed lips to keep their airways open, help oxygenate them better, practice when at rest or doing strenuous activity. Patient Verbalizes understanding of technique and will work on and be reiterated during [facility].    Goals/Expected Outcomes Short: use PLB with exertion. Long: use PLB on exertion proficiently and independently.          Initial Exercise Prescription:  Initial Exercise Prescription - 09/30/23 1000       Date of Initial Exercise RX and Referring Provider   Date 09/30/23    Referring Provider Dr. Prentice Flatten, MD      Oxygen   Maintain Oxygen Saturation 88% or higher      Treadmill   MPH 1.8    Grade 0    Minutes 15    METs 2.38      Recumbant Bike   Level 2    RPM 50    Watts 15    Minutes 15    METs 1.96      NuStep   Level 2    SPM 80    Minutes 15    METs 1.96      T5 Nustep   Level 1    SPM 80    Minutes 15    METs 1.96      Prescription Details   Frequency (times per week) 3    Duration Progress to 30 minutes of continuous aerobic without signs/symptoms of physical distress      Intensity   THRR 40-80% of Max Heartrate 103-129    Ratings of Perceived Exertion 11-13    Perceived Dyspnea 0-4      Progression   Progression Continue to progress workloads to maintain intensity without signs/symptoms of physical distress.      Resistance Training   Training Prescription Yes    Weight 3 lb    Reps 10-15          Perform Capillary Blood Glucose checks as needed.  Exercise Prescription Changes:   Exercise Prescription  Changes     Row Name 09/30/23 1000 10/19/23 0800           Response to Exercise   Blood Pressure (Admit) 114/68 104/68      Blood Pressure (Exercise) 134/82 126/60      Blood Pressure (Exit) 128/78 112/68      Heart  Rate (Admit) 78 bpm 95 bpm      Heart Rate (Exercise) 101 bpm 114 bpm      Heart Rate (Exit) 86 bpm 90 bpm      Oxygen Saturation (Admit) 97 % 96 %      Oxygen Saturation (Exercise) 93 % 94 %      Oxygen Saturation (Exit) 97 % 97 %      Rating of Perceived Exertion (Exercise) 12 13      Perceived Dyspnea (Exercise) 1 1      Symptoms none none      Comments Results 1st 2 weeks of exercise sessions      Duration -- Progress to 30 minutes of  aerobic without signs/symptoms of physical distress      Intensity -- THRR unchanged        Progression   Progression -- Continue to progress workloads to maintain intensity without signs/symptoms of physical distress.      Average METs -- 2.16        Resistance Training   Training Prescription -- Yes      Weight -- 3 lb      Reps -- 10-15        Interval Training   Interval Training -- No        Treadmill   MPH -- 2      Grade -- 0      Minutes -- 15      METs -- 2.53        Recumbant Bike   Level -- 2      Watts -- 15      Minutes -- 19      METs -- 2.54        NuStep   Level -- 4      Minutes -- 15      METs -- 2.3        Arm Ergometer   Level -- 1      Minutes -- 15      METs -- 1        T5 Nustep   Level -- 1      Minutes -- 15      METs -- 1.9        Oxygen   Maintain Oxygen Saturation -- 88% or higher         Exercise Comments:   Exercise Comments     Row Name 10/06/23 1617           Exercise Comments First full day of exercise!  Patient was oriented to gym and equipment including functions, settings, policies, and procedures.  Patient's individual exercise prescription and treatment plan were reviewed.  All starting workloads were established based on the results of the 6 minute  walk test done at initial orientation visit.  The plan for exercise progression was also introduced and progression will be customized based on patient's performance and goals.          Exercise Goals and Review:   Exercise Goals     Row Name 09/30/23 1020             Exercise Goals   Increase Physical Activity Yes       Intervention Provide advice, education, support and counseling about physical activity/exercise needs.;Develop an individualized exercise prescription for aerobic and resistive training based on initial evaluation findings, risk stratification, comorbidities and participant's personal goals.       Expected Outcomes Short Term: Attend rehab on  a regular basis to increase amount of physical activity.;Long Term: Add in home exercise to make exercise part of routine and to increase amount of physical activity.;Long Term: Exercising regularly at least 3-5 days a week.       Increase Strength and Stamina Yes       Intervention Provide advice, education, support and counseling about physical activity/exercise needs.;Develop an individualized exercise prescription for aerobic and resistive training based on initial evaluation findings, risk stratification, comorbidities and participant's personal goals.       Expected Outcomes Short Term: Increase workloads from initial exercise prescription for resistance, speed, and METs.;Short Term: Perform resistance training exercises routinely during rehab and add in resistance training at home;Long Term: Improve cardiorespiratory fitness, muscular endurance and strength as measured by increased METs and functional capacity ( )       Able to understand and use rate of perceived exertion (RPE) scale Yes       Intervention Provide education and explanation on how to use RPE scale       Expected Outcomes Short Term: Able to use RPE daily in rehab to express subjective intensity level;Long Term:  Able to use RPE to guide intensity level when  exercising independently       Able to understand and use Dyspnea scale Yes       Intervention Provide education and explanation on how to use Dyspnea scale       Expected Outcomes Short Term: Able to use Dyspnea scale daily in rehab to express subjective sense of shortness of breath during exertion;Long Term: Able to use Dyspnea scale to guide intensity level when exercising independently       Knowledge and understanding of Target Heart Rate Range (THRR) Yes       Intervention Provide education and explanation of THRR including how the numbers were predicted and where they are located for reference       Expected Outcomes Short Term: Able to state/look up THRR;Short Term: Able to use daily as guideline for intensity in rehab;Long Term: Able to use THRR to govern intensity when exercising independently       Able to check pulse independently Yes       Intervention Provide education and demonstration on how to check pulse in carotid and radial arteries.;Review the importance of being able to check your own pulse for safety during independent exercise       Expected Outcomes Short Term: Able to explain why pulse checking is important during independent exercise;Long Term: Able to check pulse independently and accurately       Understanding of Exercise Prescription Yes       Intervention Provide education, explanation, and written materials on patient's individual exercise prescription       Expected Outcomes Short Term: Able to explain program exercise prescription;Long Term: Able to explain home exercise prescription to exercise independently          Exercise Goals Re-Evaluation :  Exercise Goals Re-Evaluation     Row Name 10/06/23 1617 10/19/23 0843           Exercise Goal Re-Evaluation   Exercise Goals Review Increase Physical Activity;Able to understand and use rate of perceived exertion (RPE) scale;Knowledge and understanding of Target Heart Rate Range (THRR);Understanding of Exercise  Prescription;Increase Strength and Stamina;Able to understand and use Dyspnea scale;Able to check pulse independently Increase Physical Activity;Understanding of Exercise Prescription;Increase Strength and Stamina      Comments Reviewed RPE and dyspnea scale, THR and program prescription with  pt today.  Pt voiced understanding and was given a copy of goals to take home. Malak is off to a good start in the program and completed his first 2 weeks of exercise sessions in this review. He had a workload on the treadmill at a speed of 2 mph with no incline. He worked at level 4 on the T4 nustep, level 1 on the T5 nustep, level 2 on the recumbent bike, and level 1 on the arm ergometer. We will continue to monitor his progress in the program.      Expected Outcomes Short: Use RPE daily to regulate intensity.  Long: Follow program prescription in THR. Short: Continue to follow current exercise prescription. Long: Continue exercise to improve strength and stamina.         Discharge Exercise Prescription (Final Exercise Prescription Changes):  Exercise Prescription Changes - 10/19/23 0800       Response to Exercise   Blood Pressure (Admit) 104/68    Blood Pressure (Exercise) 126/60    Blood Pressure (Exit) 112/68    Heart Rate (Admit) 95 bpm    Heart Rate (Exercise) 114 bpm    Heart Rate (Exit) 90 bpm    Oxygen Saturation (Admit) 96 %    Oxygen Saturation (Exercise) 94 %    Oxygen Saturation (Exit) 97 %    Rating of Perceived Exertion (Exercise) 13    Perceived Dyspnea (Exercise) 1    Symptoms none    Comments 1st 2 weeks of exercise sessions    Duration Progress to 30 minutes of  aerobic without signs/symptoms of physical distress    Intensity THRR unchanged      Progression   Progression Continue to progress workloads to maintain intensity without signs/symptoms of physical distress.    Average METs 2.16      Resistance Training   Training Prescription Yes    Weight 3 lb    Reps 10-15       Interval Training   Interval Training No      Treadmill   MPH 2    Grade 0    Minutes 15    METs 2.53      Recumbant Bike   Level 2    Watts 15    Minutes 19    METs 2.54      NuStep   Level 4    Minutes 15    METs 2.3      Arm Ergometer   Level 1    Minutes 15    METs 1      T5 Nustep   Level 1    Minutes 15    METs 1.9      Oxygen   Maintain Oxygen Saturation 88% or higher          Nutrition:  Target Goals: Understanding of nutrition guidelines, daily intake of sodium 1500mg , cholesterol 200mg , calories 30% from fat and 7% or less from saturated fats, daily to have 5 or more servings of fruits and vegetables.  Education: Nutrition 1 -Group instruction provided by verbal, written material, interactive activities, discussions, models, and posters to present general guidelines for heart healthy nutrition including macronutrients, label reading, and promoting whole foods over processed counterparts. Education serves as Pensions consultant of discussion of heart healthy eating for all. Written material provided at class time.     Education: Nutrition 2 -Group instruction provided by verbal, written material, interactive activities, discussions, models, and posters to present general guidelines for heart healthy nutrition including sodium,  cholesterol, and saturated fat. Providing guidance of habit forming to improve blood pressure, cholesterol, and body weight. Written material provided at class time.     Biometrics:  Pre Biometrics - 09/30/23 1021       Pre Biometrics   Height 5' 9.5 (1.765 m)    Weight 198 lb 1.6 oz (89.9 kg)    Waist Circumference 39 inches    Hip Circumference 40 inches    Waist to Hip Ratio 0.98 %    BMI (Calculated) 28.84    Single Leg Stand 10.13 seconds           Nutrition Therapy Plan and Nutrition Goals:  Nutrition Therapy & Goals - 09/30/23 1002       Intervention Plan   Intervention Prescribe, educate and counsel  regarding individualized specific dietary modifications aiming towards targeted core components such as weight, hypertension, lipid management, diabetes, heart failure and other comorbidities.    Expected Outcomes Short Term Goal: Understand basic principles of dietary content, such as calories, fat, sodium, cholesterol and nutrients.;Short Term Goal: A plan has been developed with personal nutrition goals set during dietitian appointment.;Long Term Goal: Adherence to prescribed nutrition plan.          Nutrition Assessments:  MEDIFICTS Score Key: >=70 Need to make dietary changes  40-70 Heart Healthy Diet <= 40 Therapeutic Level Cholesterol Diet  Flowsheet Row Pulmonary Rehab from 10/08/2023 in [facility] Cardiac and Pulmonary Rehab  Picture Your Plate Total Score on Admission 45   Picture Your Plate Scores: <59 Unhealthy dietary pattern with much room for improvement. 41-50 Dietary pattern unlikely to meet recommendations for good health and room for improvement. 51-60 More healthful dietary pattern, with some room for improvement.  >60 Healthy dietary pattern, although there may be some specific behaviors that could be improved.   Nutrition Goals Re-Evaluation:  Nutrition Goals Re-Evaluation     Row Name 11/01/23 1101             Goals   Comment Patient was informed on why it is important to maintain a balanced diet when dealing with Respiratory issues. Explained that it takes a lot of energy to breath and when they are short of breath often they will need to have a good diet to help keep up with the calories they are expending for breathing.       Expected Outcome Short: Choose and plan snacks accordingly to patients caloric intake to improve breathing. Long: Maintain a diet independently that meets their caloric intake to aid in daily shortness of breath.          Nutrition Goals Discharge (Final Nutrition Goals Re-Evaluation):  Nutrition Goals Re-Evaluation - 11/01/23 1101        Goals   Comment Patient was informed on why it is important to maintain a balanced diet when dealing with Respiratory issues. Explained that it takes a lot of energy to breath and when they are short of breath often they will need to have a good diet to help keep up with the calories they are expending for breathing.    Expected Outcome Short: Choose and plan snacks accordingly to patients caloric intake to improve breathing. Long: Maintain a diet independently that meets their caloric intake to aid in daily shortness of breath.          Psychosocial: Target Goals: Acknowledge presence or absence of significant depression and/or stress, maximize coping skills, provide positive support system. Participant is able to verbalize types and ability to  use techniques and skills needed for reducing stress and depression.   Education: Stress, Anxiety, and Depression - Group verbal and visual presentation to define topics covered.  Reviews how body is impacted by stress, anxiety, and depression.  Also discusses healthy ways to reduce stress and to treat/manage anxiety and depression.  Written material provided at class time.   Education: Sleep Hygiene -Provides group verbal and written instruction about how sleep can affect your health.  Define sleep hygiene, discuss sleep cycles and impact of sleep habits. Review good sleep hygiene tips.    Initial Review & Psychosocial Screening:  Initial Psych Review & Screening - 09/29/23 1426       Initial Review   Current issues with Current Psychotropic Meds;History of Depression;Current Depression;Current Anxiety/Panic      Family Dynamics   Good Support System? Yes    Comments Damere states that his depression is there and has a family history of depression. He can look to his spouse for support.      Barriers   Psychosocial barriers to participate in program There are no identifiable barriers or psychosocial needs.;The patient should benefit from  training in stress management and relaxation.      Screening Interventions   Interventions To provide support and resources with identified psychosocial needs;Provide feedback about the scores to participant;Encouraged to exercise    Expected Outcomes Short Term goal: Utilizing psychosocial counselor, staff and physician to assist with identification of specific Stressors or current issues interfering with healing process. Setting desired goal for each stressor or current issue identified.;Long Term Goal: Stressors or current issues are controlled or eliminated.;Short Term goal: Identification and review with participant of any Quality of Life or Depression concerns found by scoring the questionnaire.;Long Term goal: The participant improves quality of Life and PHQ9 Scores as seen by post scores and/or verbalization of changes          Quality of Life Scores:  Scores of 19 and below usually indicate a poorer quality of life in these areas.  A difference of  2-3 points is a clinically meaningful difference.  A difference of 2-3 points in the total score of the Quality of Life Index has been associated with significant improvement in overall quality of life, self-image, physical symptoms, and general health in studies assessing change in quality of life.  PHQ-9: Review Flowsheet  More data exists      11/01/2023 09/30/2023 09/06/2019 08/02/2019 07/26/2019  Depression screen PHQ 2/9  Decreased Interest 3 2 1 1 1   Down, Depressed, Hopeless 3 2 1  0 1  PHQ - 2 Score 6 4 2 1 2   Altered sleeping 2 2 0 0 0  Tired, decreased energy 3 2 1 1 1   Change in appetite 1 1 0 0 0  Feeling bad or failure about yourself  0 0 0 0 0  Trouble concentrating 2 2 0 0 0  Moving slowly or fidgety/restless 1 1 1  0 0  Suicidal thoughts 0 0 0 0 0  PHQ-9 Score 15 12 4 2 3   Difficult doing work/chores Not difficult at all Somewhat difficult Not difficult at all Not difficult at all Not difficult at all   Interpretation of  Total Score  Total Score Depression Severity:  1-4 = Minimal depression, 5-9 = Mild depression, 10-14 = Moderate depression, 15-19 = Moderately severe depression, 20-27 = Severe depression   Psychosocial Evaluation and Intervention:  Psychosocial Evaluation - 09/29/23 1428       Psychosocial Evaluation &  Interventions   Interventions Encouraged to exercise with the program and follow exercise prescription;Relaxation education;Stress management education    Comments Nickoles states that his depression is there and has a family history of depression. He can look to his spouse for support.    Expected Outcomes Short: Start [facility] to help with mood. Long: Maintain a healthy mental state    Continue Psychosocial Services  Follow up required by staff          Psychosocial Re-Evaluation:  Psychosocial Re-Evaluation     Row Name 11/01/23 1105             Psychosocial Re-Evaluation   Current issues with Current Depression;History of Depression;Current Psychotropic Meds;Current Stress Concerns       Comments Reviewed patient health questionnaire (PHQ-9) with patient for follow up. Previously, patients score indicated signs/symptoms of depression.  Reviewed to see if patient is improving symptom wise while in program.  Score declined and patient states that it is because he has chronic depression and a lack of energy.       Expected Outcomes Short: Continue to work toward an improvement in PHQ9 scores by attending [facility] regularly. Long: Continue to improve stress and depression coping skills by talking with staff and attending [facility] regularly and work toward a positive mental state.       Interventions Encouraged to attend Pulmonary Rehabilitation for the exercise       Continue Psychosocial Services  Follow up required by staff          Psychosocial Discharge (Final Psychosocial Re-Evaluation):  Psychosocial Re-Evaluation - 11/01/23 1105       Psychosocial Re-Evaluation    Current issues with Current Depression;History of Depression;Current Psychotropic Meds;Current Stress Concerns    Comments Reviewed patient health questionnaire (PHQ-9) with patient for follow up. Previously, patients score indicated signs/symptoms of depression.  Reviewed to see if patient is improving symptom wise while in program.  Score declined and patient states that it is because he has chronic depression and a lack of energy.    Expected Outcomes Short: Continue to work toward an improvement in PHQ9 scores by attending [facility] regularly. Long: Continue to improve stress and depression coping skills by talking with staff and attending [facility] regularly and work toward a positive mental state.    Interventions Encouraged to attend Pulmonary Rehabilitation for the exercise    Continue Psychosocial Services  Follow up required by staff          Education: Education Goals: Education classes will be provided on a weekly basis, covering required topics. Participant will state understanding/return demonstration of topics presented.  Learning Barriers/Preferences:  Learning Barriers/Preferences - 09/29/23 1426       Learning Barriers/Preferences   Learning Barriers Hearing    Learning Preferences None          General Pulmonary Education Topics:  Infection Prevention: - Provides verbal and written material to individual with discussion of infection control including proper hand washing and proper equipment cleaning during exercise session. Flowsheet Row Pulmonary Rehab from 09/30/2023 in [facility] Cardiac and Pulmonary Rehab  Date 09/29/23  Educator jh  Instruction Review Code 1- Verbalizes Understanding    Falls Prevention: - Provides verbal and written material to individual with discussion of falls prevention and safety. Flowsheet Row Pulmonary Rehab from 09/30/2023 in [facility] Cardiac and Pulmonary Rehab  Date 09/29/23  Educator jh  Instruction Review Code 1- Verbalizes  Understanding    Chronic Lung Disease Review: - Group verbal instruction with posters,  models, PowerPoint presentations and videos,  to review new updates, new respiratory medications, new advancements in procedures and treatments. Providing information on websites and 800 numbers for continued self-education. Includes information about supplement oxygen, available portable oxygen systems, continuous and intermittent flow rates, oxygen safety, concentrators, and Medicare reimbursement for oxygen. Explanation of Pulmonary Drugs, including class, frequency, complications, importance of spacers, rinsing mouth after steroid MDI's, and proper cleaning methods for nebulizers. Review of basic lung anatomy and physiology related to function, structure, and complications of lung disease. Review of risk factors. Discussion about methods for diagnosing sleep apnea and types of masks and machines for OSA. Includes a review of the use of types of environmental controls: home humidity, furnaces, filters, dust mite/pet prevention, HEPA vacuums. Discussion about weather changes, air quality and the benefits of nasal washing. Instruction on Warning signs, infection symptoms, calling MD promptly, preventive modes, and value of vaccinations. Review of effective airway clearance, coughing and/or vibration techniques. Emphasizing that all should Create an Action Plan. Written material provided at class time. Flowsheet Row Cardiac Rehab from 08/30/2019 in [facility] Cardiac and Pulmonary Rehab  Date 08/30/19  Educator jh  Instruction Review Code 1- Verbalizes Understanding    AED/CPR: - Group verbal and written instruction with the use of models to demonstrate the basic use of the AED with the basic ABC's of resuscitation.    Tests and Procedures:  - Group verbal and visual presentation and models provide information about basic cardiac anatomy and function. Reviews the testing methods done to diagnose heart disease and the  outcomes of the test results. Describes the treatment choices: Medical Management, Angioplasty, or Coronary Bypass Surgery for treating various heart conditions including Myocardial Infarction, Angina, Valve Disease, and Cardiac Arrhythmias.  Written material provided at class time.   Medication Safety: - Group verbal and visual instruction to review commonly prescribed medications for heart and lung disease. Reviews the medication, class of the drug, and side effects. Includes the steps to properly store meds and maintain the prescription regimen.  Written material given at graduation.   Other: -Provides group and verbal instruction on various topics (see comments)   Knowledge Questionnaire Score:  Knowledge Questionnaire Score - 09/30/23 1001       Knowledge Questionnaire Score   Pre Score 14/18           Core Components/Risk Factors/Patient Goals at Admission:  Personal Goals and Risk Factors at Admission - 09/29/23 1425       Core Components/Risk Factors/Patient Goals on Admission    Weight Management Yes;Weight Loss    Intervention Weight Management: Develop a combined nutrition and exercise program designed to reach desired caloric intake, while maintaining appropriate intake of nutrient and fiber, sodium and fats, and appropriate energy expenditure required for the weight goal.;Weight Management: Provide education and appropriate resources to help participant work on and attain dietary goals.;Weight Management/Obesity: Establish reasonable short term and long term weight goals.    Expected Outcomes Short Term: Continue to assess and modify interventions until short term weight is achieved;Long Term: Adherence to nutrition and physical activity/exercise program aimed toward attainment of established weight goal;Weight Loss: Understanding of general recommendations for a balanced deficit meal plan, which promotes 1-2 lb weight loss per week and includes a negative energy balance of  [phone] kcal/d;Understanding recommendations for meals to include 15-35% energy as protein, 25-35% energy from fat, 35-60% energy from carbohydrates, less than 200mg  of dietary cholesterol, 20-35 gm of total fiber daily;Understanding of distribution of calorie intake throughout the  day with the consumption of 4-5 meals/snacks    Improve shortness of breath with ADL's Yes    Intervention Provide education, individualized exercise plan and daily activity instruction to help decrease symptoms of SOB with activities of daily living.    Expected Outcomes Short Term: Improve cardiorespiratory fitness to achieve a reduction of symptoms when performing ADLs;Long Term: Be able to perform more ADLs without symptoms or delay the onset of symptoms    Heart Failure Yes    Intervention Provide a combined exercise and nutrition program that is supplemented with education, support and counseling about heart failure. Directed toward relieving symptoms such as shortness of breath, decreased exercise tolerance, and extremity edema.    Expected Outcomes Improve functional capacity of life;Short term: Attendance in program 2-3 days a week with increased exercise capacity. Reported lower sodium intake. Reported increased fruit and vegetable intake. Reports medication compliance.;Short term: Daily weights obtained and reported for increase. Utilizing diuretic protocols set by physician.;Long term: Adoption of self-care skills and reduction of barriers for early signs and symptoms recognition and intervention leading to self-care maintenance.    Hypertension Yes    Intervention Provide education on lifestyle modifcations including regular physical activity/exercise, weight management, moderate sodium restriction and increased consumption of fresh fruit, vegetables, and low fat dairy, alcohol moderation, and smoking cessation.;Monitor prescription use compliance.    Expected Outcomes Short Term: Continued assessment and  intervention until BP is < 140/64mm HG in hypertensive participants. < 130/33mm HG in hypertensive participants with diabetes, heart failure or chronic kidney disease.;Long Term: Maintenance of blood pressure at goal levels.    Lipids Yes    Intervention Provide education and support for participant on nutrition & aerobic/resistive exercise along with prescribed medications to achieve LDL 70mg , HDL >40mg .    Expected Outcomes Short Term: Participant states understanding of desired cholesterol values and is compliant with medications prescribed. Participant is following exercise prescription and nutrition guidelines.;Long Term: Cholesterol controlled with medications as prescribed, with individualized exercise RX and with personalized nutrition plan. Value goals: LDL < 70mg , HDL > 40 mg.          Education:Diabetes - Individual verbal and written instruction to review signs/symptoms of diabetes, desired ranges of glucose level fasting, after meals and with exercise. Acknowledge that pre and post exercise glucose checks will be done for 3 sessions at entry of program.   Know Your Numbers and Heart Failure: - Group verbal and visual instruction to discuss disease risk factors for cardiac and pulmonary disease and treatment options.  Reviews associated critical values for Overweight/Obesity, Hypertension, Cholesterol, and Diabetes.  Discusses basics of heart failure: signs/symptoms and treatments.  Introduces Heart Failure Zone chart for action plan for heart failure. Written material provided at class time. Flowsheet Row Pulmonary Rehab from 09/30/2023 in [facility] Cardiac and Pulmonary Rehab  Education need identified 09/30/23    Core Components/Risk Factors/Patient Goals Review:    Core Components/Risk Factors/Patient Goals at Discharge (Final Review):    ITP Comments:  ITP Comments     Row Name 09/29/23 1431 09/30/23 0957 10/06/23 1054 10/06/23 1617 11/03/23 1127   ITP Comments Virtual Visit  completed. Patient informed on EP and RD appointment and 6 Minute walk test. Patient also informed of patient health questionnaires on My Chart. Patient Verbalizes understanding. Visit diagnosis can be found in [facility] 06/03/2023. Completed and gym orientation for respiratory care services. Initial ITP created and sent for review to Dr. Faud Aleskerov, Medical Director. 30 Day review completed. Medical Director  ITP review done; changes made as directed and signed approval by Medical Director. New to program. First full day of exercise!  Patient was oriented to gym and equipment including functions, settings, policies, and procedures.  Patient's individual exercise prescription and treatment plan were reviewed.  All starting workloads were established based on the results of the 6 minute walk test done at initial orientation visit.  The plan for exercise progression was also introduced and progression will be customized based on patient's performance and goals. 30 Day review completed. Medical Director ITP review done; changes made as directed and signed approval by Medical Director. New to program.      Comments: 30 day review

## 2023-11-05 ENCOUNTER — Encounter

## 2023-11-08 ENCOUNTER — Encounter

## 2023-11-08 DIAGNOSIS — I5042 Chronic combined systolic (congestive) and diastolic (congestive) heart failure: Secondary | ICD-10-CM

## 2023-11-08 NOTE — Progress Notes (Signed)
 Daily Session Note  Patient Details  Name: Chase Boyd MRN: [id number] Date of Birth: 26-May-1945 Referring Provider:   Flowsheet Row Pulmonary Rehab from 09/30/2023 in [facility] Cardiac and Pulmonary Rehab  Referring Provider Dr. Prentice Flatten, MD    Encounter Date: 11/08/2023  Check In:  Session Check In - 11/08/23 1101       Check-In   Supervising physician immediately available to respond to emergencies See telemetry face sheet for immediately available ER MD    Location [facility]-Cardiac & Pulmonary Rehab    Staff Present Burnard Davenport [facility] Peggi, RN, DNP, NE-BC;Joseph Hood RCP,RRT,BSRT;Laura Cates RN,BSN;Jaquille Kau [place] BS, ACSM CEP, Exercise Physiologist    Virtual Visit No    Medication changes reported     No    Fall or balance concerns reported    No    Tobacco Cessation No Change    Warm-up and Cool-down Performed on first and last piece of equipment    Resistance Training Performed Yes    VAD Patient? No    PAD/SET Patient? No      Pain Assessment   Currently in Pain? No/denies             Social History   Tobacco Use  Smoking Status Never  Smokeless Tobacco Never    Goals Met:  Proper associated with RPD/PD & O2 Sat Independence with exercise equipment Using PLB without cueing & demonstrates good technique Exercise tolerated well No report of concerns or symptoms today Strength training completed today  Goals Unmet:  Not Applicable  Comments: Pt able to follow exercise prescription today without complaint.  Will continue to monitor for progression.    Dr. Oneil Pinal is Medical Director for [facility] Cardiac Rehabilitation.  Dr. Fuad Aleskerov is Medical Director for [facility] Pulmonary Rehabilitation.

## 2023-11-10 ENCOUNTER — Encounter

## 2023-11-10 ENCOUNTER — Encounter: Attending: Internal Medicine

## 2023-11-10 DIAGNOSIS — I5042 Chronic combined systolic (congestive) and diastolic (congestive) heart failure: Secondary | ICD-10-CM | POA: Insufficient documentation

## 2023-11-10 NOTE — Progress Notes (Signed)
 Daily Session Note  Patient Details  Name: Chase Boyd MRN: [id number] Date of Birth: Jun 14, 1945 Referring Provider:   Flowsheet Row Pulmonary Rehab from 09/30/2023 in [facility] Cardiac and Pulmonary Rehab  Referring Provider Dr. Prentice Flatten, MD    Encounter Date: 11/10/2023  Check In:  Session Check In - 11/10/23 1059       Check-In   Supervising physician immediately available to respond to emergencies See telemetry face sheet for immediately available ER MD    Location [facility]-Cardiac & Pulmonary Rehab    Staff Present Burnard Davenport [facility] Peggi, RN, DNP, NE-BC;Joseph [facility] RN,BSN;Margaret Best, MS, Exercise Physiologist    Virtual Visit No    Medication changes reported     No    Fall or balance concerns reported    No    Tobacco Cessation No Change    Warm-up and Cool-down Performed on first and last piece of equipment    Resistance Training Performed Yes    VAD Patient? No    PAD/SET Patient? No      Pain Assessment   Currently in Pain? No/denies             Social History   Tobacco Use  Smoking Status Never  Smokeless Tobacco Never    Goals Met:  Proper associated with RPD/PD & O2 Sat Independence with exercise equipment Using PLB without cueing & demonstrates good technique Exercise tolerated well No report of concerns or symptoms today Strength training completed today  Goals Unmet:  Not Applicable  Comments: Pt able to follow exercise prescription today without complaint.  Will continue to monitor for progression.    Dr. Oneil Pinal is Medical Director for [facility] Cardiac Rehabilitation.  Dr. Fuad Aleskerov is Medical Director for [facility] Pulmonary Rehabilitation.

## 2023-11-12 ENCOUNTER — Encounter

## 2023-11-12 DIAGNOSIS — I5042 Chronic combined systolic (congestive) and diastolic (congestive) heart failure: Secondary | ICD-10-CM | POA: Diagnosis not present

## 2023-11-12 NOTE — Progress Notes (Signed)
 Daily Session Note  Patient Details  Name: Chase Boyd MRN: [id number] Date of Birth: May 25, 1945 Referring Provider:   Flowsheet Row Pulmonary Rehab from 09/30/2023 in [facility] Cardiac and Pulmonary Rehab  Referring Provider Dr. Prentice Flatten, MD    Encounter Date: 11/12/2023  Check In:  Session Check In - 11/12/23 1107       Check-In   Supervising physician immediately available to respond to emergencies See telemetry face sheet for immediately available ER MD    Location [facility]-Cardiac & Pulmonary Rehab    Staff Present Burnard Davenport RN,BSN,MPA;Laura Cates RN,BSN;Joseph Rolinda RCP,RRT,BSRT;Maxon Conetta BS, Exercise Physiologist    Virtual Visit No    Medication changes reported     No    Fall or balance concerns reported    No    Tobacco Cessation No Change    Warm-up and Cool-down Performed on first and last piece of equipment    Resistance Training Performed Yes    VAD Patient? No    PAD/SET Patient? No      Pain Assessment   Currently in Pain? No/denies             Social History   Tobacco Use  Smoking Status Never  Smokeless Tobacco Never    Goals Met:  Proper associated with RPD/PD & O2 Sat Independence with exercise equipment Using PLB without cueing & demonstrates good technique Exercise tolerated well No report of concerns or symptoms today Strength training completed today  Goals Unmet:  Not Applicable  Comments: Pt able to follow exercise prescription today without complaint.  Will continue to monitor for progression.    Dr. Oneil Pinal is Medical Director for [facility] Cardiac Rehabilitation.  Dr. Fuad Aleskerov is Medical Director for [facility] Pulmonary Rehabilitation.

## 2023-11-15 ENCOUNTER — Encounter

## 2023-11-15 DIAGNOSIS — I5042 Chronic combined systolic (congestive) and diastolic (congestive) heart failure: Secondary | ICD-10-CM | POA: Diagnosis not present

## 2023-11-15 NOTE — Progress Notes (Signed)
 Daily Session Note  Patient Details  Name: Chase Boyd MRN: [id number] Date of Birth: September 02, 1945 Referring Provider:   Flowsheet Row Pulmonary Rehab from 09/30/2023 in [facility] Cardiac and Pulmonary Rehab  Referring Provider Dr. Prentice Flatten, MD    Encounter Date: 11/15/2023  Check In:  Session Check In - 11/15/23 1052       Check-In   Supervising physician immediately available to respond to emergencies See telemetry face sheet for immediately available ER MD    Location [facility]-Cardiac & Pulmonary Rehab    Staff Present Burnard Davenport RN,BSN,MPA;Joseph Rolinda RCP,RRT,BSRT;Laura Cates RN,BSN;Ingra Rother [place] BS, ACSM CEP, Exercise Physiologist    Virtual Visit No    Medication changes reported     No    Fall or balance concerns reported    No    Tobacco Cessation No Change    Warm-up and Cool-down Performed on first and last piece of equipment    Resistance Training Performed Yes    VAD Patient? No    PAD/SET Patient? No      Pain Assessment   Currently in Pain? No/denies             Social History   Tobacco Use  Smoking Status Never  Smokeless Tobacco Never    Goals Met:  Proper associated with RPD/PD & O2 Sat Independence with exercise equipment Using PLB without cueing & demonstrates good technique Exercise tolerated well No report of concerns or symptoms today Strength training completed today  Goals Unmet:  Not Applicable  Comments: Pt able to follow exercise prescription today without complaint.  Will continue to monitor for progression.    Pulmonary:  Reviewed home exercise with pt today from 11:24am to 11:34am.  Pt plans to use his TM at home for exercise.  Reviewed THR, pulse, RPE, sign and symptoms, pulse oximetery and when to call 911 or MD.  Also discussed weather considerations and indoor options.  Pt voiced understanding.   Dr. Oneil Pinal is Medical Director for [facility] Cardiac Rehabilitation.  Dr. Fuad Aleskerov is Medical Director for  [facility] Pulmonary Rehabilitation.

## 2023-11-17 ENCOUNTER — Encounter

## 2023-11-17 DIAGNOSIS — I5042 Chronic combined systolic (congestive) and diastolic (congestive) heart failure: Secondary | ICD-10-CM | POA: Diagnosis not present

## 2023-11-17 NOTE — Progress Notes (Signed)
 Daily Session Note  Patient Details  Name: Chase Boyd MRN: [id number] Date of Birth: 1945-03-11 Referring Provider:   Flowsheet Row Pulmonary Rehab from 09/30/2023 in [facility] Cardiac and Pulmonary Rehab  Referring Provider Dr. Prentice Flatten, MD    Encounter Date: 11/17/2023  Check In:  Session Check In - 11/17/23 1059       Check-In   Supervising physician immediately available to respond to emergencies See telemetry face sheet for immediately available ER MD    Location [facility]-Cardiac & Pulmonary Rehab    Staff Present Burnard Davenport [facility] Peggi, RN, DNP, NE-BC;Joseph [facility] RN,BSN;Margaret Best, MS, Exercise Physiologist;Teffany Blaszczyk Dyane BS, ACSM CEP, Exercise Physiologist    Virtual Visit No    Medication changes reported     No    Fall or balance concerns reported    No    Tobacco Cessation No Change    Warm-up and Cool-down Performed on first and last piece of equipment    Resistance Training Performed Yes    VAD Patient? No    PAD/SET Patient? No      Pain Assessment   Currently in Pain? No/denies             Social History   Tobacco Use  Smoking Status Never  Smokeless Tobacco Never    Goals Met:  Proper associated with RPD/PD & O2 Sat Independence with exercise equipment Using PLB without cueing & demonstrates good technique Exercise tolerated well No report of concerns or symptoms today Strength training completed today  Goals Unmet:  Not Applicable  Comments: Pt able to follow exercise prescription today without complaint.  Will continue to monitor for progression.    Dr. Oneil Pinal is Medical Director for [facility] Cardiac Rehabilitation.  Dr. Fuad Aleskerov is Medical Director for [facility] Pulmonary Rehabilitation.

## 2023-11-19 ENCOUNTER — Encounter: Admitting: *Deleted

## 2023-11-19 DIAGNOSIS — I5042 Chronic combined systolic (congestive) and diastolic (congestive) heart failure: Secondary | ICD-10-CM

## 2023-11-19 NOTE — Progress Notes (Signed)
 Daily Session Note  Patient Details  Name: Chase Boyd MRN: [id number] Date of Birth: 1945/09/15 Referring Provider:   Flowsheet Row Pulmonary Rehab from 09/30/2023 in [facility] Cardiac and Pulmonary Rehab  Referring Provider Dr. Prentice Flatten, MD    Encounter Date: 11/19/2023  Check In:  Session Check In - 11/19/23 1109       Check-In   Supervising physician immediately available to respond to emergencies See telemetry face sheet for immediately available ER MD    Location [facility]-Cardiac & Pulmonary Rehab    Staff Present Maxon Burnell HECKLE, Exercise Physiologist;Ameliyah Sarno Jacques RN,BSN;Mary Godley, RN, DNP, NE-BC;Noah Tickle, BS, Exercise Physiologist    Virtual Visit No    Medication changes reported     No    Fall or balance concerns reported    No    Warm-up and Cool-down Performed on first and last piece of equipment    Resistance Training Performed Yes    VAD Patient? No    PAD/SET Patient? No      Pain Assessment   Currently in Pain? No/denies             Social History   Tobacco Use  Smoking Status Never  Smokeless Tobacco Never    Goals Met:  Proper associated with RPD/PD & O2 Sat Independence with exercise equipment Using PLB without cueing & demonstrates good technique Exercise tolerated well No report of concerns or symptoms today Strength training completed today  Goals Unmet:  Not Applicable  Comments: Pt able to follow exercise prescription today without complaint.  Will continue to monitor for progression.    Dr. Oneil Pinal is Medical Director for [facility] Cardiac Rehabilitation.  Dr. Fuad Aleskerov is Medical Director for [facility] Pulmonary Rehabilitation.

## 2023-11-22 ENCOUNTER — Encounter

## 2023-11-22 DIAGNOSIS — I5042 Chronic combined systolic (congestive) and diastolic (congestive) heart failure: Secondary | ICD-10-CM

## 2023-11-22 NOTE — Progress Notes (Signed)
 Daily Session Note  Patient Details  Name: SHADRACK BRUMMITT MRN: [id number] Date of Birth: April 07, 1945 Referring Provider:   Flowsheet Row Pulmonary Rehab from 09/30/2023 in [facility] Cardiac and Pulmonary Rehab  Referring Provider Dr. Prentice Flatten, MD    Encounter Date: 11/22/2023  Check In:  Session Check In - 11/22/23 1106       Check-In   Supervising physician immediately available to respond to emergencies See telemetry face sheet for immediately available ER MD    Location [facility]-Cardiac & Pulmonary Rehab    Staff Present Burnard Davenport RN,BSN,MPA;Joseph Rolinda RCP,RRT,BSRT;Laura Cates RN,BSN;[place] Pierron Dyane BS, ACSM CEP, Exercise Physiologist    Virtual Visit No    Medication changes reported     No    Fall or balance concerns reported    No    Tobacco Cessation No Change    Warm-up and Cool-down Performed on first and last piece of equipment    Resistance Training Performed Yes    VAD Patient? No    PAD/SET Patient? No      Pain Assessment   Currently in Pain? No/denies             Social History   Tobacco Use  Smoking Status Never  Smokeless Tobacco Never    Goals Met:  Proper associated with RPD/PD & O2 Sat Independence with exercise equipment Using PLB without cueing & demonstrates good technique Exercise tolerated well No report of concerns or symptoms today Strength training completed today  Goals Unmet:  Not Applicable  Comments: Pt able to follow exercise prescription today without complaint.  Will continue to monitor for progression.    Dr. Oneil Pinal is Medical Director for [facility] Cardiac Rehabilitation.  Dr. Fuad Aleskerov is Medical Director for [facility] Pulmonary Rehabilitation.

## 2023-11-24 ENCOUNTER — Encounter

## 2023-11-24 DIAGNOSIS — I5042 Chronic combined systolic (congestive) and diastolic (congestive) heart failure: Secondary | ICD-10-CM

## 2023-11-24 NOTE — Progress Notes (Signed)
 Daily Session Note  Patient Details  Name: Chase Boyd MRN: [id number] Date of Birth: Jan 16, 1946 Referring Provider:   Flowsheet Row Pulmonary Rehab from 09/30/2023 in [facility] Cardiac and Pulmonary Rehab  Referring Provider Dr. Prentice Flatten, MD    Encounter Date: 11/24/2023  Check In:  Session Check In - 11/24/23 1054       Check-In   Supervising physician immediately available to respond to emergencies See telemetry face sheet for immediately available ER MD    Location [facility]-Cardiac & Pulmonary Rehab    Staff Present Burnard Davenport RN,BSN,MPA;Meredith Tressa RN,BSN;Joseph [facility] BS, ACSM CEP, Exercise Physiologist    Virtual Visit No    Medication changes reported     No    Fall or balance concerns reported    No    Tobacco Cessation No Change    Warm-up and Cool-down Performed on first and last piece of equipment    Resistance Training Performed Yes    VAD Patient? No    PAD/SET Patient? No      Pain Assessment   Currently in Pain? No/denies             Social History   Tobacco Use  Smoking Status Never  Smokeless Tobacco Never    Goals Met:  Proper associated with RPD/PD & O2 Sat Independence with exercise equipment Using PLB without cueing & demonstrates good technique Exercise tolerated well No report of concerns or symptoms today Strength training completed today  Goals Unmet:  Not Applicable  Comments: Pt able to follow exercise prescription today without complaint.  Will continue to monitor for progression.    Dr. Oneil Pinal is Medical Director for [facility] Cardiac Rehabilitation.  Dr. Fuad Aleskerov is Medical Director for [facility] Pulmonary Rehabilitation.

## 2023-11-26 ENCOUNTER — Encounter

## 2023-11-26 DIAGNOSIS — I5042 Chronic combined systolic (congestive) and diastolic (congestive) heart failure: Secondary | ICD-10-CM

## 2023-11-26 NOTE — Progress Notes (Signed)
 Daily Session Note  Patient Details  Name: ISAID SALVIA MRN: [id number] Date of Birth: Apr 24, 1945 Referring Provider:   Flowsheet Row Pulmonary Rehab from 09/30/2023 in [facility] Cardiac and Pulmonary Rehab  Referring Provider Dr. Prentice Flatten, MD    Encounter Date: 11/26/2023  Check In:  Session Check In - 11/26/23 1052       Check-In   Supervising physician immediately available to respond to emergencies See telemetry face sheet for immediately available ER MD    Location [facility]-Cardiac & Pulmonary Rehab    Staff Present Burnard Davenport RN,BSN,MPA;Maxon Conetta BS, Exercise Physiologist;Margaret Best, MS, Exercise Physiologist;Laureen Delores, BS, RRT, CPFT    Virtual Visit No    Medication changes reported     No    Fall or balance concerns reported    No    Tobacco Cessation No Change    Warm-up and Cool-down Performed on first and last piece of equipment    Resistance Training Performed Yes    VAD Patient? No    PAD/SET Patient? No      Pain Assessment   Currently in Pain? No/denies             Social History   Tobacco Use  Smoking Status Never  Smokeless Tobacco Never    Goals Met:  Proper associated with RPD/PD & O2 Sat Independence with exercise equipment Using PLB without cueing & demonstrates good technique Exercise tolerated well No report of concerns or symptoms today Strength training completed today  Goals Unmet:  Not Applicable  Comments: Pt able to follow exercise prescription today without complaint.  Will continue to monitor for progression.    Dr. Oneil Pinal is Medical Director for [facility] Cardiac Rehabilitation.  Dr. Fuad Aleskerov is Medical Director for [facility] Pulmonary Rehabilitation.

## 2023-11-29 ENCOUNTER — Encounter

## 2023-11-29 DIAGNOSIS — I5042 Chronic combined systolic (congestive) and diastolic (congestive) heart failure: Secondary | ICD-10-CM

## 2023-11-29 NOTE — Progress Notes (Signed)
 Daily Session Note  Patient Details  Name: Chase Boyd MRN: [id number] Date of Birth: 03/28/1945 Referring Provider:   Flowsheet Row Pulmonary Rehab from 09/30/2023 in [facility] Cardiac and Pulmonary Rehab  Referring Provider Dr. Prentice Flatten, MD    Encounter Date: 11/29/2023  Check In:  Session Check In - 11/29/23 1106       Check-In   Supervising physician immediately available to respond to emergencies See telemetry face sheet for immediately available ER MD    Location [facility]-Cardiac & Pulmonary Rehab    Staff Present Burnard Davenport RN,BSN,MPA;Joseph [facility] RCP,RRT,BSRT;Maxon Burnell BS, Exercise Physiologist;Laura Cates RN,BSN;Sabella Traore Dyane BS, ACSM CEP, Exercise Physiologist    Virtual Visit No    Medication changes reported     No    Fall or balance concerns reported    No    Tobacco Cessation No Change    Warm-up and Cool-down Performed on first and last piece of equipment    Resistance Training Performed Yes    VAD Patient? No    PAD/SET Patient? No      Pain Assessment   Currently in Pain? No/denies             Social History   Tobacco Use  Smoking Status Never  Smokeless Tobacco Never    Goals Met:  Proper associated with RPD/PD & O2 Sat Independence with exercise equipment Using PLB without cueing & demonstrates good technique Exercise tolerated well No report of concerns or symptoms today Strength training completed today  Goals Unmet:  Not Applicable  Comments: Pt able to follow exercise prescription today without complaint.  Will continue to monitor for progression.    Dr. Oneil Pinal is Medical Director for [facility] Cardiac Rehabilitation.  Dr. Fuad Aleskerov is Medical Director for [facility] Pulmonary Rehabilitation.

## 2023-12-01 ENCOUNTER — Encounter

## 2023-12-01 ENCOUNTER — Encounter: Payer: Self-pay | Admitting: *Deleted

## 2023-12-01 DIAGNOSIS — I5042 Chronic combined systolic (congestive) and diastolic (congestive) heart failure: Secondary | ICD-10-CM

## 2023-12-01 NOTE — Progress Notes (Signed)
 Daily Session Note  Patient Details  Name: Chase Boyd MRN: [id number] Date of Birth: 01-20-1946 Referring Provider:   Flowsheet Row Pulmonary Rehab from 09/30/2023 in [facility] Cardiac and Pulmonary Rehab  Referring Provider Dr. Prentice Flatten, MD    Encounter Date: 12/01/2023  Check In:  Session Check In - 12/01/23 1103       Check-In   Supervising physician immediately available to respond to emergencies See telemetry face sheet for immediately available ER MD    Location [facility]-Cardiac & Pulmonary Rehab    Staff Present Burnard Hint BS, ACSM CEP, Exercise Physiologist;Noah Tickle, BS, Exercise Physiologist;Joseph Rolinda RCP,RRT,BSRT;Severin Bou RN,BSN    Virtual Visit No    Medication changes reported     No    Fall or balance concerns reported    No    Tobacco Cessation No Change    Warm-up and Cool-down Performed on first and last piece of equipment    Resistance Training Performed Yes    VAD Patient? No    PAD/SET Patient? No      Pain Assessment   Currently in Pain? No/denies             Social History   Tobacco Use  Smoking Status Never  Smokeless Tobacco Never    Goals Met:  Proper associated with RPD/PD & O2 Sat Independence with exercise equipment Using PLB without cueing & demonstrates good technique Exercise tolerated well No report of concerns or symptoms today Strength training completed today  Goals Unmet:  Not Applicable  Comments: Pt able to follow exercise prescription today without complaint.  Will continue to monitor for progression.    Dr. Oneil Pinal is Medical Director for [facility] Cardiac Rehabilitation.  Dr. Fuad Aleskerov is Medical Director for [facility] Pulmonary Rehabilitation.

## 2023-12-01 NOTE — Progress Notes (Signed)
 Pulmonary Individual Treatment Plan  Patient Details  Name: OLIVERIO CHO MRN: [id number] Date of Birth: 08-12-1945 Referring Provider:   Flowsheet Row Pulmonary Rehab from 09/30/2023 in [facility] [place] Cardiac and Pulmonary Rehab  Referring Provider Dr. Prentice Flatten, MD    Initial Encounter Date:  Flowsheet Row Pulmonary Rehab from 09/30/2023 in [facility] Cardiac and Pulmonary Rehab  Date 09/30/23    Visit Diagnosis: Heart failure, systolic and diastolic, chronic (HCC)  Patient's Home Medications on Admission:  Current Outpatient Medications:    amiodarone (PACERONE) 200 MG tablet, Take by mouth. (Patient not taking: Reported on 09/29/2023), Disp: , Rfl:    amLODipine (NORVASC) 5 MG tablet, Take by mouth. (Patient not taking: Reported on 09/29/2023), Disp: , Rfl:    amLODipine (NORVASC) 5 MG tablet, Take 5 mg by mouth daily., Disp: , Rfl:    amoxicillin (AMOXIL) 500 MG capsule, TAKE 4 CAPSULES (2,000MG  TOTAL) BY MOUTH 1 HOUR PRIOR TO DENTIST, Disp: , Rfl:    apixaban (ELIQUIS) 5 MG TABS tablet, Take by mouth. (Patient not taking: Reported on 09/29/2023), Disp: , Rfl:    apixaban (ELIQUIS) 5 MG TABS tablet, Take 5 mg by mouth., Disp: , Rfl:    ARIPiprazole (ABILIFY) 10 MG tablet, Take 10 mg by mouth., Disp: , Rfl:    buPROPion (WELLBUTRIN XL) 150 MG 24 hr tablet, Take by mouth., Disp: , Rfl:    buPROPion (WELLBUTRIN XL) 300 MG 24 hr tablet, Take 300 mg by mouth daily., Disp: , Rfl:    colestipol (COLESTID) 1 g tablet, Take 2 g by mouth., Disp: , Rfl:    escitalopram (LEXAPRO) 20 MG tablet, Take by mouth. (Patient not taking: Reported on 09/29/2023), Disp: , Rfl:    finasteride (PROSCAR) 5 MG tablet, Take 1 tablet PO daily (Patient not taking: Reported on 09/29/2023), Disp: , Rfl:    finasteride (PROSCAR) 5 MG tablet, Take 5 mg by mouth daily., Disp: , Rfl:    furosemide (LASIX) 20 MG tablet, Take by mouth., Disp: , Rfl:    metoprolol succinate (TOPROL-XL) 25 MG 24 hr tablet, Take 25 mg by mouth  daily., Disp: , Rfl:    Multiple Vitamin (MULTI-VITAMIN) tablet, Take by mouth., Disp: , Rfl:    Multiple Vitamins-Minerals (ONE DAILY CALCIUM/IRON) TABS, Take by mouth. (Patient not taking: Reported on 09/29/2023), Disp: , Rfl:    PARoxetine (PAXIL) 30 MG tablet, Take 30 mg by mouth., Disp: , Rfl:    pravastatin (PRAVACHOL) 20 MG tablet, Take 20 mg by mouth., Disp: , Rfl:    tamsulosin (FLOMAX) 0.4 MG CAPS capsule, Take 0.4 mg by mouth., Disp: , Rfl:   Past Medical History: No past medical history on file.  Tobacco Use: Social History   Tobacco Use  Smoking Status Never  Smokeless Tobacco Never    Labs: Review Flowsheet        No data to display           Pulmonary Assessment Scores:  Pulmonary Assessment Scores     Row Name 09/30/23 0959         ADL UCSD   ADL Phase Entry     SOB Score total 31     Rest 1     Walk 1     Stairs 2     Bath 0     Dress 0     Shop 1       CAT Score   CAT Score 14       mMRC Score  mMRC Score 2        UCSD: Self-administered rating of dyspnea associated with activities of daily living (ADLs) 6-point scale (0 = not at all to 5 = maximal or unable to do because of breathlessness)  Scoring Scores range from 0 to 120.  Minimally important difference is 5 units  CAT: CAT can identify the health impairment of COPD patients and is better correlated with disease progression.  CAT has a scoring range of zero to 40. The CAT score is classified into four groups of low (less than 10), medium (10 - 20), high (21-30) and very high (31-40) based on the impact level of disease on health status. A CAT score over 10 suggests significant symptoms.  A worsening CAT score could be explained by an exacerbation, poor medication adherence, poor inhaler technique, or progression of COPD or comorbid conditions.  CAT MCID is 2 points  mMRC: mMRC (Modified Medical Research Council) Dyspnea Scale is used to assess the degree of baseline functional  disability in patients of respiratory disease due to dyspnea. No minimal important difference is established. A decrease in score of 1 point or greater is considered a positive change.   Pulmonary Function Assessment:   Exercise Target Goals: Exercise Program Goal: Individual exercise prescription set using results from initial 6 min walk test and THRR while considering  patient's activity barriers and safety.   Exercise Prescription Goal: Initial exercise prescription builds to 30-45 minutes a day of aerobic activity, 2-3 days per week.  Home exercise guidelines will be given to patient during program as part of exercise prescription that the participant will acknowledge.  Education: Aerobic Exercise: - Group verbal and visual presentation on the components of exercise prescription. Introduces F.I.T.T principle from ACSM for exercise prescriptions.  Reviews F.I.T.T. principles of aerobic exercise including progression. Written material provided at class time.   Education: Resistance Exercise: - Group verbal and visual presentation on the components of exercise prescription. Introduces F.I.T.T principle from ACSM for exercise prescriptions  Reviews F.I.T.T. principles of resistance exercise including progression. Written material provided at class time.    Education: Exercise & Equipment Safety: - Individual verbal instruction and demonstration of equipment use and safety with use of the equipment. Flowsheet Row Pulmonary Rehab from 09/30/2023 in [facility] Cardiac and Pulmonary Rehab  Date 09/29/23  Educator jh  Instruction Review Code 1- Verbalizes Understanding    Education: Exercise Physiology & General Exercise Guidelines: - Group verbal and written instruction with models to review the exercise physiology of the cardiovascular system and associated critical values. Provides general exercise guidelines with specific guidelines to those with heart or lung disease.    Education:  Flexibility, Balance, Mind/Body Relaxation: - Group verbal and visual presentation with interactive activity on the components of exercise prescription. Introduces F.I.T.T principle from ACSM for exercise prescriptions. Reviews F.I.T.T. principles of flexibility and balance exercise training including progression. Also discusses the mind body connection.  Reviews various relaxation techniques to help reduce and manage stress (i.e. Deep breathing, progressive muscle relaxation, and visualization). Balance handout provided to take home. Written material provided at class time.   Activity Barriers & Risk Stratification:  Activity Barriers & Cardiac Risk Stratification - 09/30/23 1020       Activity Barriers & Cardiac Risk Stratification   Activity Barriers Joint Problems;Deconditioning;Balance Concerns;Muscular Weakness          6 Minute Walk:  6 Minute Walk     Row Name 09/30/23 1018  6 Minute Walk   Phase Initial     Distance 1020 feet     Walk Time 6 minutes     # of Rest Breaks 0     MPH 1.93     METS 1.96     RPE 12     Perceived Dyspnea  1     VO2 Peak 6.87     Symptoms No     Resting HR 78 bpm     Resting BP 114/68     Resting Oxygen Saturation  97 %     Exercise Oxygen Saturation  during 6 min walk 93 %     Max Ex. HR 101 bpm     Max Ex. BP 134/82     2 Minute Post BP 128/78       Interval HR   1 Minute HR 93     2 Minute HR 94     3 Minute HR 97     4 Minute HR 97     5 Minute HR 97     6 Minute HR 101     2 Minute Post HR 86     Interval Heart Rate? Yes       Interval Oxygen   Interval Oxygen? Yes     Baseline Oxygen Saturation % 97 %     1 Minute Oxygen Saturation % 93 %     1 Minute Liters of Oxygen 0 L  RA     2 Minute Oxygen Saturation % 96 %     2 Minute Liters of Oxygen 0 L     3 Minute Oxygen Saturation % 96 %     3 Minute Liters of Oxygen 0 L     4 Minute Oxygen Saturation % 96 %     4 Minute Liters of Oxygen 0 L     5 Minute Oxygen  Saturation % 93 %     5 Minute Liters of Oxygen 0 L     6 Minute Oxygen Saturation % 96 %     6 Minute Liters of Oxygen 0 L     2 Minute Post Oxygen Saturation % 97 %     2 Minute Post Liters of Oxygen 0 L       Oxygen Initial Assessment:   Oxygen Re-Evaluation:  Oxygen Re-Evaluation     Row Name 10/06/23 1620 11/01/23 1100 11/15/23 1142         Program Oxygen Prescription   Program Oxygen Prescription -- None None       Home Oxygen   Home Oxygen Device -- None None     Sleep Oxygen Prescription -- None --     Home Exercise Oxygen Prescription -- None None     Home Resting Oxygen Prescription -- None None       Goals/Expected Outcomes   Short Term Goals -- To learn and demonstrate proper pursed lip breathing techniques or other breathing techniques.  To learn and understand importance of maintaining oxygen saturations>88%;To learn and understand importance of monitoring SPO2 with pulse oximeter and demonstrate accurate use of the pulse oximeter.     Long  Term Goals -- Exhibits proper breathing techniques, such as pursed lip breathing or other method taught during program session Verbalizes importance of monitoring SPO2 with pulse oximeter and return demonstration;Maintenance of O2 saturations>88%     Comments Reviewed PLB technique with pt.  Talked about how it works and it's importance in maintaining their exercise  saturations. Informed patient how to perform the Pursed Lipped breathing technique. Told patient to Inhale through the nose and out the mouth with pursed lips to keep their airways open, help oxygenate them better, practice when at rest or doing strenuous activity. Patient Verbalizes understanding of technique and will work on and be reiterated during [facility]. Evaan reports that he does not have a pulse ox at home. Home exercise was reviewed with patient today and how to monitor HR and Sa O2 during home exericse was discussed. He was encouraged to purchase a pulse ox so  that he can monitor these vitals at home during daily activities and exercise.     Goals/Expected Outcomes Short: Become more profiecient at using PLB.   Long: Become independent at using PLB. Short: use PLB with exertion. Long: use PLB on exertion proficiently and independently. Short: buy a pulse ox and start monitoring at home. Long: independently monitor SaO2 levels at home.        Oxygen Discharge (Final Oxygen Re-Evaluation):  Oxygen Re-Evaluation - 11/15/23 1142       Program Oxygen Prescription   Program Oxygen Prescription None      Home Oxygen   Home Oxygen Device None    Home Exercise Oxygen Prescription None    Home Resting Oxygen Prescription None      Goals/Expected Outcomes   Short Term Goals To learn and understand importance of maintaining oxygen saturations>88%;To learn and understand importance of monitoring SPO2 with pulse oximeter and demonstrate accurate use of the pulse oximeter.    Long  Term Goals Verbalizes importance of monitoring SPO2 with pulse oximeter and return demonstration;Maintenance of O2 saturations>88%    Comments Decker reports that he does not have a pulse ox at home. Home exercise was reviewed with patient today and how to monitor HR and Sa O2 during home exericse was discussed. He was encouraged to purchase a pulse ox so that he can monitor these vitals at home during daily activities and exercise.    Goals/Expected Outcomes Short: buy a pulse ox and start monitoring at home. Long: independently monitor SaO2 levels at home.          Initial Exercise Prescription:  Initial Exercise Prescription - 09/30/23 1000       Date of Initial Exercise RX and Referring Provider   Date 09/30/23    Referring Provider Dr. Prentice Flatten, MD      Oxygen   Maintain Oxygen Saturation 88% or higher      Treadmill   MPH 1.8    Grade 0    Minutes 15    METs 2.38      Recumbant Bike   Level 2    RPM 50    Watts 15    Minutes 15    METs 1.96       NuStep   Level 2    SPM 80    Minutes 15    METs 1.96      T5 Nustep   Level 1    SPM 80    Minutes 15    METs 1.96      Prescription Details   Frequency (times per week) 3    Duration Progress to 30 minutes of continuous aerobic without signs/symptoms of physical distress      Intensity   THRR 40-80% of Max Heartrate 103-129    Ratings of Perceived Exertion 11-13    Perceived Dyspnea 0-4      Progression   Progression Continue  to progress workloads to maintain intensity without signs/symptoms of physical distress.      Resistance Training   Training Prescription Yes    Weight 3 lb    Reps 10-15          Perform Capillary Blood Glucose checks as needed.  Exercise Prescription Changes:   Exercise Prescription Changes     Row Name 09/30/23 1000 10/19/23 0800 11/04/23 1000 11/15/23 1100 11/18/23 1600     Response to Exercise   Blood Pressure (Admit) 114/68 104/68 110/76 -- 112/66   Blood Pressure (Exercise) 134/82 126/60 142/88 -- 122/72   Blood Pressure (Exit) 128/78 112/68 110/80 -- 110/60   Heart Rate (Admit) 78 bpm 95 bpm 100 bpm -- 86 bpm   Heart Rate (Exercise) 101 bpm 114 bpm 126 bpm -- 108 bpm   Heart Rate (Exit) 86 bpm 90 bpm 95 bpm -- 100 bpm   Oxygen Saturation (Admit) 97 % 96 % 97 % -- 97 %   Oxygen Saturation (Exercise) 93 % 94 % 94 % -- 94 %   Oxygen Saturation (Exit) 97 % 97 % 98 % -- 98 %   Rating of Perceived Exertion (Exercise) 12 13 17  -- 15   Perceived Dyspnea (Exercise) 1 1 2  -- 1   Symptoms none none none -- none   Comments Results 1st 2 weeks of exercise sessions -- -- --   Duration -- Progress to 30 minutes of  aerobic without signs/symptoms of physical distress Progress to 30 minutes of  aerobic without signs/symptoms of physical distress Progress to 30 minutes of  aerobic without signs/symptoms of physical distress Progress to 30 minutes of  aerobic without signs/symptoms of physical distress   Intensity -- THRR unchanged THRR  unchanged THRR unchanged THRR unchanged     Progression   Progression -- Continue to progress workloads to maintain intensity without signs/symptoms of physical distress. Continue to progress workloads to maintain intensity without signs/symptoms of physical distress. Continue to progress workloads to maintain intensity without signs/symptoms of physical distress. Continue to progress workloads to maintain intensity without signs/symptoms of physical distress.   Average METs -- 2.16 2.52 2.52 2.64     Resistance Training   Training Prescription -- Yes Yes Yes Yes   Weight -- 3 lb 3 lb 3 lb 3 lb   Reps -- 10-15 10-15 10-15 10-15     Interval Training   Interval Training -- No No No No     Treadmill   MPH -- 2 2.5 2.5 2.5   Grade -- 0 1 1 0   Minutes -- 15 15 15 15    METs -- 2.53 3.26 3.26 2.91     Recumbant Bike   Level -- 2 10 10 7    Watts -- 15 15 15  39   Minutes -- 19 39 39 15   METs -- 2.54 3.39 3.39 3.38     NuStep   Level -- 4 3 3 3    Minutes -- 15 15 15 15    METs -- 2.3 2.1 2.1 2.6     Arm Ergometer   Level -- 1 1 1  1.2   Minutes -- 15 15 15 15    METs -- 1 1 1 1      Elliptical   Level -- -- 1 1 --   Speed -- -- 3 3 --   Minutes -- -- 15 15 --   METs -- -- 3 3 --     T5 Nustep  Level -- 1 2 2  --   Minutes -- 15 15 15  --   METs -- 1.9 1.9 1.9 --     Biostep-RELP   Level -- -- 1 1 1    SPM -- -- 50 50 --   Minutes -- -- 15 15 15    METs -- -- 3 3 2      Home Exercise Plan   Plans to continue exercise at -- -- -- Home (comment)  TM Home (comment)  TM   Frequency -- -- -- Add 2 additional days to program exercise sessions. Add 2 additional days to program exercise sessions.   Initial Home Exercises Provided -- -- -- 11/15/23 11/15/23     Oxygen   Maintain Oxygen Saturation -- 88% or higher 88% or higher 88% or higher 88% or higher      Exercise Comments:   Exercise Comments     Row Name 10/06/23 1617           Exercise Comments First full day of  exercise!  Patient was oriented to gym and equipment including functions, settings, policies, and procedures.  Patient's individual exercise prescription and treatment plan were reviewed.  All starting workloads were established based on the results of the 6 minute walk test done at initial orientation visit.  The plan for exercise progression was also introduced and progression will be customized based on patient's performance and goals.          Exercise Goals and Review:   Exercise Goals     Row Name 09/30/23 1020             Exercise Goals   Increase Physical Activity Yes       Intervention Provide advice, education, support and counseling about physical activity/exercise needs.;Develop an individualized exercise prescription for aerobic and resistive training based on initial evaluation findings, risk stratification, comorbidities and participant's personal goals.       Expected Outcomes Short Term: Attend rehab on a regular basis to increase amount of physical activity.;Long Term: Add in home exercise to make exercise part of routine and to increase amount of physical activity.;Long Term: Exercising regularly at least 3-5 days a week.       Increase Strength and Stamina Yes       Intervention Provide advice, education, support and counseling about physical activity/exercise needs.;Develop an individualized exercise prescription for aerobic and resistive training based on initial evaluation findings, risk stratification, comorbidities and participant's personal goals.       Expected Outcomes Short Term: Increase workloads from initial exercise prescription for resistance, speed, and METs.;Short Term: Perform resistance training exercises routinely during rehab and add in resistance training at home;Long Term: Improve cardiorespiratory fitness, muscular endurance and strength as measured by increased METs and functional capacity ( )       Able to understand and use rate of perceived  exertion (RPE) scale Yes       Intervention Provide education and explanation on how to use RPE scale       Expected Outcomes Short Term: Able to use RPE daily in rehab to express subjective intensity level;Long Term:  Able to use RPE to guide intensity level when exercising independently       Able to understand and use Dyspnea scale Yes       Intervention Provide education and explanation on how to use Dyspnea scale       Expected Outcomes Short Term: Able to use Dyspnea scale daily in rehab to express subjective sense  of shortness of breath during exertion;Long Term: Able to use Dyspnea scale to guide intensity level when exercising independently       Knowledge and understanding of Target Heart Rate Range (THRR) Yes       Intervention Provide education and explanation of THRR including how the numbers were predicted and where they are located for reference       Expected Outcomes Short Term: Able to state/look up THRR;Short Term: Able to use daily as guideline for intensity in rehab;Long Term: Able to use THRR to govern intensity when exercising independently       Able to check pulse independently Yes       Intervention Provide education and demonstration on how to check pulse in carotid and radial arteries.;Review the importance of being able to check your own pulse for safety during independent exercise       Expected Outcomes Short Term: Able to explain why pulse checking is important during independent exercise;Long Term: Able to check pulse independently and accurately       Understanding of Exercise Prescription Yes       Intervention Provide education, explanation, and written materials on patient's individual exercise prescription       Expected Outcomes Short Term: Able to explain program exercise prescription;Long Term: Able to explain home exercise prescription to exercise independently          Exercise Goals Re-Evaluation :  Exercise Goals Re-Evaluation     Row Name 10/06/23  1617 10/19/23 0843 11/04/23 1054 11/15/23 1137 11/18/23 1653     Exercise Goal Re-Evaluation   Exercise Goals Review Increase Physical Activity;Able to understand and use rate of perceived exertion (RPE) scale;Knowledge and understanding of Target Heart Rate Range (THRR);Understanding of Exercise Prescription;Increase Strength and Stamina;Able to understand and use Dyspnea scale;Able to check pulse independently Increase Physical Activity;Understanding of Exercise Prescription;Increase Strength and Stamina Increase Physical Activity;Understanding of Exercise Prescription;Increase Strength and Stamina Increase Physical Activity;Increase Strength and Stamina;Able to understand and use rate of perceived exertion (RPE) scale;Able to understand and use Dyspnea scale;Knowledge and understanding of Target Heart Rate Range (THRR);Able to check pulse independently;Understanding of Exercise Prescription;Improve claudication pain tolerance and improve walking ability Increase Physical Activity;Understanding of Exercise Prescription;Increase Strength and Stamina   Comments Reviewed RPE and dyspnea scale, THR and program prescription with pt today.  Pt voiced understanding and was given a copy of goals to take home. Amarrion is off to a good start in the program and completed his first 2 weeks of exercise sessions in this review. He had a workload on the treadmill at a speed of 2 mph with no incline. He worked at level 4 on the T4 nustep, level 1 on the T5 nustep, level 2 on the recumbent bike, and level 1 on the arm ergometer. We will continue to monitor his progress in the program. Bayani is doing well in rehab. He increased his workload on the treadmill to a speed of 2. with 1% incline. He also increased to level 2 on the T5 nustep and level 10 on the recumbent bike. He tried the elliptical at level 1 and also the biostep at level 1. He maintained level 1 on the arm ergometer. We will continue to monitor his progress  in the program. Reviewed home exercise with pt today from 11:24am to 11:34am.  Pt plans to use his TM at home for exercise.  Reviewed THR, pulse, RPE, sign and symptoms, pulse oximetery and when to call 911 or MD.  Also  discussed weather considerations and indoor options.  Pt voiced understanding. Keelin is doing well in rehab. He maintained his speed on the treadmill at 2.5 mph. He also maintained level 3 on the T4 nustep. He increased to level 1.2 on the arm ergometer. He decreased slightly on the recumbent bike to level 7. We will continue to monitor his progress in the program.   Expected Outcomes Short: Use RPE daily to regulate intensity.  Long: Follow program prescription in THR. Short: Continue to follow current exercise prescription. Long: Continue exercise to improve strength and stamina. Short: Continue to progressively increase treadmill and nustep workloads. Long: Continue exercise to improve strength and stamina. Short: add 1-2 days a week of exercies at home on off days of rehab. Long: maintain independent exercise routine upon graduation from program. Short: Progressively increase recumbent bike workload back up when able. Long: Continue exercise to improve strength and stamina.      Discharge Exercise Prescription (Final Exercise Prescription Changes):  Exercise Prescription Changes - 11/18/23 1600       Response to Exercise   Blood Pressure (Admit) 112/66    Blood Pressure (Exercise) 122/72    Blood Pressure (Exit) 110/60    Heart Rate (Admit) 86 bpm    Heart Rate (Exercise) 108 bpm    Heart Rate (Exit) 100 bpm    Oxygen Saturation (Admit) 97 %    Oxygen Saturation (Exercise) 94 %    Oxygen Saturation (Exit) 98 %    Rating of Perceived Exertion (Exercise) 15    Perceived Dyspnea (Exercise) 1    Symptoms none    Duration Progress to 30 minutes of  aerobic without signs/symptoms of physical distress    Intensity THRR unchanged      Progression   Progression Continue to  progress workloads to maintain intensity without signs/symptoms of physical distress.    Average METs 2.64      Resistance Training   Training Prescription Yes    Weight 3 lb    Reps 10-15      Interval Training   Interval Training No      Treadmill   MPH 2.5    Grade 0    Minutes 15    METs 2.91      Recumbant Bike   Level 7    Watts 39    Minutes 15    METs 3.38      NuStep   Level 3    Minutes 15    METs 2.6      Arm Ergometer   Level 1.2    Minutes 15    METs 1      Biostep-RELP   Level 1    Minutes 15    METs 2      Home Exercise Plan   Plans to continue exercise at Home (comment)   TM   Frequency Add 2 additional days to program exercise sessions.    Initial Home Exercises Provided 11/15/23      Oxygen   Maintain Oxygen Saturation 88% or higher          Nutrition:  Target Goals: Understanding of nutrition guidelines, daily intake of sodium 1500mg , cholesterol 200mg , calories 30% from fat and 7% or less from saturated fats, daily to have 5 or more servings of fruits and vegetables.  Education: Nutrition 1 -Group instruction provided by verbal, written material, interactive activities, discussions, models, and posters to present general guidelines for heart healthy nutrition including macronutrients, label reading, and promoting  whole foods over processed counterparts. Education serves as Pensions consultant of discussion of heart healthy eating for all. Written material provided at class time.     Education: Nutrition 2 -Group instruction provided by verbal, written material, interactive activities, discussions, models, and posters to present general guidelines for heart healthy nutrition including sodium, cholesterol, and saturated fat. Providing guidance of habit forming to improve blood pressure, cholesterol, and body weight. Written material provided at class time.     Biometrics:  Pre Biometrics - 09/30/23 1021       Pre Biometrics   Height 5'  9.5 (1.765 m)    Weight 198 lb 1.6 oz (89.9 kg)    Waist Circumference 39 inches    Hip Circumference 40 inches    Waist to Hip Ratio 0.98 %    BMI (Calculated) 28.84    Single Leg Stand 10.13 seconds           Nutrition Therapy Plan and Nutrition Goals:  Nutrition Therapy & Goals - 09/30/23 1002       Intervention Plan   Intervention Prescribe, educate and counsel regarding individualized specific dietary modifications aiming towards targeted core components such as weight, hypertension, lipid management, diabetes, heart failure and other comorbidities.    Expected Outcomes Short Term Goal: Understand basic principles of dietary content, such as calories, fat, sodium, cholesterol and nutrients.;Short Term Goal: A plan has been developed with personal nutrition goals set during dietitian appointment.;Long Term Goal: Adherence to prescribed nutrition plan.          Nutrition Assessments:  MEDIFICTS Score Key: >=70 Need to make dietary changes  40-70 Heart Healthy Diet <= 40 Therapeutic Level Cholesterol Diet  Flowsheet Row Pulmonary Rehab from 10/08/2023 in [facility] Cardiac and Pulmonary Rehab  Picture Your Plate Total Score on Admission 45   Picture Your Plate Scores: <59 Unhealthy dietary pattern with much room for improvement. 41-50 Dietary pattern unlikely to meet recommendations for good health and room for improvement. 51-60 More healthful dietary pattern, with some room for improvement.  >60 Healthy dietary pattern, although there may be some specific behaviors that could be improved.   Nutrition Goals Re-Evaluation:  Nutrition Goals Re-Evaluation     Row Name 11/01/23 1101             Goals   Comment Patient was informed on why it is important to maintain a balanced diet when dealing with Respiratory issues. Explained that it takes a lot of energy to breath and when they are short of breath often they will need to have a good diet to help keep up with the  calories they are expending for breathing.       Expected Outcome Short: Choose and plan snacks accordingly to patients caloric intake to improve breathing. Long: Maintain a diet independently that meets their caloric intake to aid in daily shortness of breath.          Nutrition Goals Discharge (Final Nutrition Goals Re-Evaluation):  Nutrition Goals Re-Evaluation - 11/01/23 1101       Goals   Comment Patient was informed on why it is important to maintain a balanced diet when dealing with Respiratory issues. Explained that it takes a lot of energy to breath and when they are short of breath often they will need to have a good diet to help keep up with the calories they are expending for breathing.    Expected Outcome Short: Choose and plan snacks accordingly to patients caloric intake to improve breathing. Long:  Maintain a diet independently that meets their caloric intake to aid in daily shortness of breath.          Psychosocial: Target Goals: Acknowledge presence or absence of significant depression and/or stress, maximize coping skills, provide positive support system. Participant is able to verbalize types and ability to use techniques and skills needed for reducing stress and depression.   Education: Stress, Anxiety, and Depression - Group verbal and visual presentation to define topics covered.  Reviews how body is impacted by stress, anxiety, and depression.  Also discusses healthy ways to reduce stress and to treat/manage anxiety and depression.  Written material provided at class time.   Education: Sleep Hygiene -Provides group verbal and written instruction about how sleep can affect your health.  Define sleep hygiene, discuss sleep cycles and impact of sleep habits. Review good sleep hygiene tips.    Initial Review & Psychosocial Screening:  Initial Psych Review & Screening - 09/29/23 1426       Initial Review   Current issues with Current Psychotropic Meds;History of  Depression;Current Depression;Current Anxiety/Panic      Family Dynamics   Good Support System? Yes    Comments Stalin states that his depression is there and has a family history of depression. He can look to his spouse for support.      Barriers   Psychosocial barriers to participate in program There are no identifiable barriers or psychosocial needs.;The patient should benefit from training in stress management and relaxation.      Screening Interventions   Interventions To provide support and resources with identified psychosocial needs;Provide feedback about the scores to participant;Encouraged to exercise    Expected Outcomes Short Term goal: Utilizing psychosocial counselor, staff and physician to assist with identification of specific Stressors or current issues interfering with healing process. Setting desired goal for each stressor or current issue identified.;Long Term Goal: Stressors or current issues are controlled or eliminated.;Short Term goal: Identification and review with participant of any Quality of Life or Depression concerns found by scoring the questionnaire.;Long Term goal: The participant improves quality of Life and PHQ9 Scores as seen by post scores and/or verbalization of changes          Quality of Life Scores:  Scores of 19 and below usually indicate a poorer quality of life in these areas.  A difference of  2-3 points is a clinically meaningful difference.  A difference of 2-3 points in the total score of the Quality of Life Index has been associated with significant improvement in overall quality of life, self-image, physical symptoms, and general health in studies assessing change in quality of life.  PHQ-9: Review Flowsheet  More data exists      11/01/2023 09/30/2023 09/06/2019 08/02/2019 07/26/2019  Depression screen PHQ 2/9  Decreased Interest 3 2 1 1 1   Down, Depressed, Hopeless 3 2 1  0 1  PHQ - 2 Score 6 4 2 1 2   Altered sleeping 2 2 0 0 0  Tired,  decreased energy 3 2 1 1 1   Change in appetite 1 1 0 0 0  Feeling bad or failure about yourself  0 0 0 0 0  Trouble concentrating 2 2 0 0 0  Moving slowly or fidgety/restless 1 1 1  0 0  Suicidal thoughts 0 0 0 0 0  PHQ-9 Score 15 12 4 2 3   Difficult doing work/chores Not difficult at all Somewhat difficult Not difficult at all Not difficult at all Not difficult at all  Interpretation of Total Score  Total Score Depression Severity:  1-4 = Minimal depression, 5-9 = Mild depression, 10-14 = Moderate depression, 15-19 = Moderately severe depression, 20-27 = Severe depression   Psychosocial Evaluation and Intervention:  Psychosocial Evaluation - 09/29/23 1428       Psychosocial Evaluation & Interventions   Interventions Encouraged to exercise with the program and follow exercise prescription;Relaxation education;Stress management education    Comments Crimson states that his depression is there and has a family history of depression. He can look to his spouse for support.    Expected Outcomes Short: Start [facility] to help with mood. Long: Maintain a healthy mental state    Continue Psychosocial Services  Follow up required by staff          Psychosocial Re-Evaluation:  Psychosocial Re-Evaluation     Row Name 11/01/23 1105             Psychosocial Re-Evaluation   Current issues with Current Depression;History of Depression;Current Psychotropic Meds;Current Stress Concerns       Comments Reviewed patient health questionnaire (PHQ-9) with patient for follow up. Previously, patients score indicated signs/symptoms of depression.  Reviewed to see if patient is improving symptom wise while in program.  Score declined and patient states that it is because he has chronic depression and a lack of energy.       Expected Outcomes Short: Continue to work toward an improvement in PHQ9 scores by attending [facility] regularly. Long: Continue to improve stress and depression coping skills by  talking with staff and attending [facility] regularly and work toward a positive mental state.       Interventions Encouraged to attend Pulmonary Rehabilitation for the exercise       Continue Psychosocial Services  Follow up required by staff          Psychosocial Discharge (Final Psychosocial Re-Evaluation):  Psychosocial Re-Evaluation - 11/01/23 1105       Psychosocial Re-Evaluation   Current issues with Current Depression;History of Depression;Current Psychotropic Meds;Current Stress Concerns    Comments Reviewed patient health questionnaire (PHQ-9) with patient for follow up. Previously, patients score indicated signs/symptoms of depression.  Reviewed to see if patient is improving symptom wise while in program.  Score declined and patient states that it is because he has chronic depression and a lack of energy.    Expected Outcomes Short: Continue to work toward an improvement in PHQ9 scores by attending [facility] regularly. Long: Continue to improve stress and depression coping skills by talking with staff and attending [facility] regularly and work toward a positive mental state.    Interventions Encouraged to attend Pulmonary Rehabilitation for the exercise    Continue Psychosocial Services  Follow up required by staff          Education: Education Goals: Education classes will be provided on a weekly basis, covering required topics. Participant will state understanding/return demonstration of topics presented.  Learning Barriers/Preferences:  Learning Barriers/Preferences - 09/29/23 1426       Learning Barriers/Preferences   Learning Barriers Hearing    Learning Preferences None          General Pulmonary Education Topics:  Infection Prevention: - Provides verbal and written material to individual with discussion of infection control including proper hand washing and proper equipment cleaning during exercise session. Flowsheet Row Pulmonary Rehab from 09/30/2023 in [facility]  Cardiac and Pulmonary Rehab  Date 09/29/23  Educator jh  Instruction Review Code 1- Verbalizes Understanding    Falls Prevention: -  Provides verbal and written material to individual with discussion of falls prevention and safety. Flowsheet Row Pulmonary Rehab from 09/30/2023 in [facility] Cardiac and Pulmonary Rehab  Date 09/29/23  Educator jh  Instruction Review Code 1- Verbalizes Understanding    Chronic Lung Disease Review: - Group verbal instruction with posters, models, PowerPoint presentations and videos,  to review new updates, new respiratory medications, new advancements in procedures and treatments. Providing information on websites and 800 numbers for continued self-education. Includes information about supplement oxygen, available portable oxygen systems, continuous and intermittent flow rates, oxygen safety, concentrators, and Medicare reimbursement for oxygen. Explanation of Pulmonary Drugs, including class, frequency, complications, importance of spacers, rinsing mouth after steroid MDI's, and proper cleaning methods for nebulizers. Review of basic lung anatomy and physiology related to function, structure, and complications of lung disease. Review of risk factors. Discussion about methods for diagnosing sleep apnea and types of masks and machines for OSA. Includes a review of the use of types of environmental controls: home humidity, furnaces, filters, dust mite/pet prevention, HEPA vacuums. Discussion about weather changes, air quality and the benefits of nasal washing. Instruction on Warning signs, infection symptoms, calling MD promptly, preventive modes, and value of vaccinations. Review of effective airway clearance, coughing and/or vibration techniques. Emphasizing that all should Create an Action Plan. Written material provided at class time. Flowsheet Row Cardiac Rehab from 08/30/2019 in [facility] Cardiac and Pulmonary Rehab  Date 08/30/19  Educator jh  Instruction Review Code 1-  Verbalizes Understanding    AED/CPR: - Group verbal and written instruction with the use of models to demonstrate the basic use of the AED with the basic ABC's of resuscitation.    Tests and Procedures:  - Group verbal and visual presentation and models provide information about basic cardiac anatomy and function. Reviews the testing methods done to diagnose heart disease and the outcomes of the test results. Describes the treatment choices: Medical Management, Angioplasty, or Coronary Bypass Surgery for treating various heart conditions including Myocardial Infarction, Angina, Valve Disease, and Cardiac Arrhythmias.  Written material provided at class time.   Medication Safety: - Group verbal and visual instruction to review commonly prescribed medications for heart and lung disease. Reviews the medication, class of the drug, and side effects. Includes the steps to properly store meds and maintain the prescription regimen.  Written material given at graduation.   Other: -Provides group and verbal instruction on various topics (see comments)   Knowledge Questionnaire Score:  Knowledge Questionnaire Score - 09/30/23 1001       Knowledge Questionnaire Score   Pre Score 14/18           Core Components/Risk Factors/Patient Goals at Admission:  Personal Goals and Risk Factors at Admission - 09/29/23 1425       Core Components/Risk Factors/Patient Goals on Admission    Weight Management Yes;Weight Loss    Intervention Weight Management: Develop a combined nutrition and exercise program designed to reach desired caloric intake, while maintaining appropriate intake of nutrient and fiber, sodium and fats, and appropriate energy expenditure required for the weight goal.;Weight Management: Provide education and appropriate resources to help participant work on and attain dietary goals.;Weight Management/Obesity: Establish reasonable short term and long term weight goals.    Expected  Outcomes Short Term: Continue to assess and modify interventions until short term weight is achieved;Long Term: Adherence to nutrition and physical activity/exercise program aimed toward attainment of established weight goal;Weight Loss: Understanding of general recommendations for a balanced deficit meal plan, which  promotes 1-2 lb weight loss per week and includes a negative energy balance of [id number] kcal/d;Understanding recommendations for meals to include 15-35% energy as protein, 25-35% energy from fat, 35-60% energy from carbohydrates, less than 200mg  of dietary cholesterol, 20-35 gm of total fiber daily;Understanding of distribution of calorie intake throughout the day with the consumption of 4-5 meals/snacks    Improve shortness of breath with ADL's Yes    Intervention Provide education, individualized exercise plan and daily activity instruction to help decrease symptoms of SOB with activities of daily living.    Expected Outcomes Short Term: Improve cardiorespiratory fitness to achieve a reduction of symptoms when performing ADLs;Long Term: Be able to perform more ADLs without symptoms or delay the onset of symptoms    Heart Failure Yes    Intervention Provide a combined exercise and nutrition program that is supplemented with education, support and counseling about heart failure. Directed toward relieving symptoms such as shortness of breath, decreased exercise tolerance, and extremity edema.    Expected Outcomes Improve functional capacity of life;Short term: Attendance in program 2-3 days a week with increased exercise capacity. Reported lower sodium intake. Reported increased fruit and vegetable intake. Reports medication compliance.;Short term: Daily weights obtained and reported for increase. Utilizing diuretic protocols set by physician.;Long term: Adoption of self-care skills and reduction of barriers for early signs and symptoms recognition and intervention leading to self-care maintenance.     Hypertension Yes    Intervention Provide education on lifestyle modifcations including regular physical activity/exercise, weight management, moderate sodium restriction and increased consumption of fresh fruit, vegetables, and low fat dairy, alcohol moderation, and smoking cessation.;Monitor prescription use compliance.    Expected Outcomes Short Term: Continued assessment and intervention until BP is < 140/42mm HG in hypertensive participants. < 130/33mm HG in hypertensive participants with diabetes, heart failure or chronic kidney disease.;Long Term: Maintenance of blood pressure at goal levels.    Lipids Yes    Intervention Provide education and support for participant on nutrition & aerobic/resistive exercise along with prescribed medications to achieve LDL 70mg , HDL >40mg .    Expected Outcomes Short Term: Participant states understanding of desired cholesterol values and is compliant with medications prescribed. Participant is following exercise prescription and nutrition guidelines.;Long Term: Cholesterol controlled with medications as prescribed, with individualized exercise RX and with personalized nutrition plan. Value goals: LDL < 70mg , HDL > 40 mg.          Education:Diabetes - Individual verbal and written instruction to review signs/symptoms of diabetes, desired ranges of glucose level fasting, after meals and with exercise. Acknowledge that pre and post exercise glucose checks will be done for 3 sessions at entry of program.   Know Your Numbers and Heart Failure: - Group verbal and visual instruction to discuss disease risk factors for cardiac and pulmonary disease and treatment options.  Reviews associated critical values for Overweight/Obesity, Hypertension, Cholesterol, and Diabetes.  Discusses basics of heart failure: signs/symptoms and treatments.  Introduces Heart Failure Zone chart for action plan for heart failure. Written material provided at class time. Flowsheet Row  Pulmonary Rehab from 09/30/2023 in [facility] Cardiac and Pulmonary Rehab  Education need identified 09/30/23    Core Components/Risk Factors/Patient Goals Review:    Core Components/Risk Factors/Patient Goals at Discharge (Final Review):    ITP Comments:  ITP Comments     Row Name 09/29/23 1431 09/30/23 0957 10/06/23 1054 10/06/23 1617 11/03/23 1127   ITP Comments Virtual Visit completed. Patient informed on EP and RD appointment and 6  Minute walk test. Patient also informed of patient health questionnaires on My Chart. Patient Verbalizes understanding. Visit diagnosis can be found in [facility] 06/03/2023. Completed and gym orientation for respiratory care services. Initial ITP created and sent for review to Dr. Faud Aleskerov, Medical Director. 30 Day review completed. Medical Director ITP review done; changes made as directed and signed approval by Medical Director. New to program. First full day of exercise!  Patient was oriented to gym and equipment including functions, settings, policies, and procedures.  Patient's individual exercise prescription and treatment plan were reviewed.  All starting workloads were established based on the results of the 6 minute walk test done at initial orientation visit.  The plan for exercise progression was also introduced and progression will be customized based on patient's performance and goals. 30 Day review completed. Medical Director ITP review done; changes made as directed and signed approval by Medical Director. New to program.    Row Name 12/01/23 1112           ITP Comments 30 Day review completed. Medical Director ITP review done; changes made as directed and signed approval by Medical Director.          Comments: 30 day review

## 2023-12-03 ENCOUNTER — Encounter: Admitting: Emergency Medicine

## 2023-12-03 DIAGNOSIS — I5042 Chronic combined systolic (congestive) and diastolic (congestive) heart failure: Secondary | ICD-10-CM

## 2023-12-03 NOTE — Progress Notes (Signed)
 Daily Session Note  Patient Details  Name: Chase Boyd MRN: [id number] Date of Birth: 10/20/1945 Referring Provider:   Flowsheet Row Pulmonary Rehab from 09/30/2023 in [facility] Cardiac and Pulmonary Rehab  Referring Provider Dr. Prentice Flatten, MD    Encounter Date: 12/03/2023  Check In:  Session Check In - 12/03/23 1117       Check-In   Supervising physician immediately available to respond to emergencies See telemetry face sheet for immediately available ER MD    Location [facility]-Cardiac & Pulmonary Rehab    Staff Present Devaughn Jaeger, BS, Exercise Physiologist;Joseph Hood RCP,RRT,BSRT;Maxon Waukee BS, Exercise Physiologist;Lexia Vandevender RN,BSN    Virtual Visit No    Medication changes reported     No    Fall or balance concerns reported    No    Tobacco Cessation No Change    Warm-up and Cool-down Performed on first and last piece of equipment    Resistance Training Performed Yes    VAD Patient? No    PAD/SET Patient? No      Pain Assessment   Currently in Pain? No/denies             Social History   Tobacco Use  Smoking Status Never  Smokeless Tobacco Never    Goals Met:  Proper associated with RPD/PD & O2 Sat Independence with exercise equipment Using PLB without cueing & demonstrates good technique Exercise tolerated well No report of concerns or symptoms today Strength training completed today  Goals Unmet:  Not Applicable  Comments: Pt able to follow exercise prescription today without complaint.  Will continue to monitor for progression.    Dr. Oneil Pinal is Medical Director for [facility] Cardiac Rehabilitation.  Dr. Fuad Aleskerov is Medical Director for [facility] Pulmonary Rehabilitation.

## 2023-12-06 ENCOUNTER — Encounter

## 2023-12-07 ENCOUNTER — Encounter

## 2023-12-07 DIAGNOSIS — I5042 Chronic combined systolic (congestive) and diastolic (congestive) heart failure: Secondary | ICD-10-CM | POA: Diagnosis not present

## 2023-12-07 NOTE — Progress Notes (Signed)
 Daily Session Note  Patient Details  Name: Chase Boyd MRN: [id number] Date of Birth: 02/15/1945 Referring Provider:   Flowsheet Row Pulmonary Rehab from 09/30/2023 in [facility] Cardiac and Pulmonary Rehab  Referring Provider Dr. Prentice Flatten, MD    Encounter Date: 12/07/2023  Check In:  Session Check In - 12/07/23 1055       Check-In   Supervising physician immediately available to respond to emergencies See telemetry face sheet for immediately available ER MD    Location [facility]-Cardiac & Pulmonary Rehab    Staff Present Burnard Davenport RN,BSN,MPA;Meredith Tressa RN,BSN;Maxon Conetta BS, Exercise Physiologist;Jason Elnor RDN,LDN    Virtual Visit No    Medication changes reported     No    Fall or balance concerns reported    No    Tobacco Cessation No Change    Warm-up and Cool-down Performed on first and last piece of equipment    Resistance Training Performed Yes    VAD Patient? No    PAD/SET Patient? No      Pain Assessment   Currently in Pain? No/denies             Social History   Tobacco Use  Smoking Status Never  Smokeless Tobacco Never    Goals Met:  Proper associated with RPD/PD & O2 Sat Independence with exercise equipment Using PLB without cueing & demonstrates good technique Exercise tolerated well No report of concerns or symptoms today Strength training completed today  Goals Unmet:  Not Applicable  Comments: Pt able to follow exercise prescription today without complaint.  Will continue to monitor for progression.    Dr. Oneil Pinal is Medical Director for [facility] Cardiac Rehabilitation.  Dr. Fuad Aleskerov is Medical Director for [facility] Pulmonary Rehabilitation.

## 2023-12-08 ENCOUNTER — Encounter

## 2023-12-10 ENCOUNTER — Encounter: Admitting: Emergency Medicine

## 2023-12-10 DIAGNOSIS — I5042 Chronic combined systolic (congestive) and diastolic (congestive) heart failure: Secondary | ICD-10-CM | POA: Diagnosis not present

## 2023-12-10 NOTE — Progress Notes (Signed)
 Daily Session Note  Patient Details  Name: Chase Boyd MRN: [id number] Date of Birth: 01-Aug-1945 Referring Provider:   Flowsheet Row Pulmonary Rehab from 09/30/2023 in [facility] Cardiac [facility] Pulmonary Re[facility]  Referring Provider Dr. Prentice Flatten, MD    Encounter Date: 12/10/2023  Check In:  Session Check In - 12/10/23 1108       Check-In   Supervising physician immediately available to respond to emergencies See telemetry face sheet for immediately available ER MD    Location [facility]-Cardiac & Pulmonary Rehab    Staff Present Devaughn Jaeger, BS, Exercise Physiologist;Leor Whyte RN,BSN;Maxon Woodworth BS, Exercise Physiologist;Joseph Rolinda RCP,RRT,BSRT    Virtual Visit No    Medication changes reported     No    Fall or balance concerns reported    No    Tobacco Cessation No Change    Warm-up and Cool-down Performed on first and last piece of equipment    Resistance Training Performed Yes    VAD Patient? No    PAD/SET Patient? No      Pain Assessment   Currently in Pain? No/denies             Social History   Tobacco Use  Smoking Status Never  Smokeless Tobacco Never    Goals Met:  Proper associated with RPD/PD & O2 Sat Independence with exercise equipment Using PLB without cueing & demonstrates good technique Exercise tolerated well No report of concerns or symptoms today Strength training completed today  Goals Unmet:  Not Applicable  Comments: Pt able to follow exercise prescription today without complaint.  Will continue to monitor for progression.    Dr. Oneil Pinal is Medical Director for [facility] Cardiac Rehabilitation.  Dr. Fuad Aleskerov is Medical Director for [facility] Pulmonary Rehabilitation.

## 2023-12-13 ENCOUNTER — Encounter

## 2023-12-14 ENCOUNTER — Encounter

## 2023-12-15 ENCOUNTER — Encounter

## 2023-12-17 ENCOUNTER — Encounter

## 2023-12-20 ENCOUNTER — Encounter

## 2023-12-21 ENCOUNTER — Encounter

## 2023-12-22 ENCOUNTER — Encounter

## 2023-12-22 ENCOUNTER — Ambulatory Visit: Admitting: Physical Therapy

## 2023-12-24 ENCOUNTER — Encounter

## 2023-12-24 ENCOUNTER — Other Ambulatory Visit: Payer: Self-pay | Admitting: Orthopedic Surgery

## 2023-12-24 DIAGNOSIS — S46002A Unspecified injury of muscle(s) and tendon(s) of the rotator cuff of left shoulder, initial encounter: Secondary | ICD-10-CM

## 2023-12-27 ENCOUNTER — Ambulatory Visit: Admitting: Physical Therapy

## 2023-12-27 ENCOUNTER — Encounter

## 2023-12-28 ENCOUNTER — Ambulatory Visit: Attending: Orthopedic Surgery | Admitting: Physical Therapy

## 2023-12-28 ENCOUNTER — Encounter

## 2023-12-28 DIAGNOSIS — M6281 Muscle weakness (generalized): Secondary | ICD-10-CM | POA: Insufficient documentation

## 2023-12-28 DIAGNOSIS — R262 Difficulty in walking, not elsewhere classified: Secondary | ICD-10-CM | POA: Insufficient documentation

## 2023-12-28 DIAGNOSIS — R2681 Unsteadiness on feet: Secondary | ICD-10-CM | POA: Insufficient documentation

## 2023-12-28 NOTE — Therapy (Signed)
 OUTPATIENT PHYSICAL THERAPY NEURO EVALUATION   Patient Name: Chase Boyd MRN: [id number] DOB:[birth year], 78 y.o., male Today's Date: 12/28/2023   PCP: Dr. Arley Liza Pam REFERRING PROVIDER: Dr. Arley Liza Pam  END OF SESSION:  PT End of Session - 12/28/23 1030     Visit Number 1    Number of Visits 24    Date for Recertification  03/21/24    PT Start Time 0935    PT Stop Time 1025    PT Time Calculation (min) 50 min    Equipment Utilized During Treatment Gait belt    Activity Tolerance Patient tolerated treatment well    Behavior During Therapy [facility] for tasks assessed/performed          No past medical history on file. No past surgical history on file. There are no active problems to display for this patient.   ONSET DATE: Over a year  REFERRING DIAG: Instability of gait  THERAPY DIAG:  Difficulty in walking, not elsewhere classified  Unsteadiness on feet  Muscle weakness (generalized)  Rationale for Evaluation and Treatment: Rehabilitation  SUBJECTIVE:                                                                                                                                                                                             SUBJECTIVE STATEMENT: Pt reports doing well this morning.  Pt accompanied by: self  PERTINENT HISTORY:   Patient presents to PT with c/c of balance and instability of gait. Pt believes instability began with foot pain. He reports seeing a podiatrist for the pain, in which he received a shoe insert. Pain has since subsided. Pt states his second toe crosses over great toe, with DPM recommending potential amputation if pain remains. Two weeks ago, patient experienced a fall which he believes was due to walking in the dark. Pt was walking from bedroom to kitchen, where he ran into fridge falling onto one shoulder and then the other. Pt reports this is the only fall within the past 6 months, but walking and curbs have  become more challenging and he feels hesitant. Pt has a past history of cardiac rehab following a mitral valve replacement. Pt currently works part-time as a proofreader. Overall, pts goals are to walk around with less hesitation.   PAIN:  Are you having pain? Yes:  PRECAUTIONS: None  RED FLAGS: None   WEIGHT BEARING RESTRICTIONS: No  FALLS: Has patient fallen in last 6 months? Yes. Number of falls 1 in the past 6 months that resulted in a shoulder injury.  LIVING ENVIRONMENT: Lives with: lives with their  spouse Lives in: House/apartment Stairs: Yes: Internal: attic stairs, goes up seldomly Has following equipment at home:Cane   PLOF: Independent and Independent with basic ADLs  PATIENT GOALS: Pt would like to walk without thinking he is going to fall.   OBJECTIVE:  Note: Objective measures were completed at Evaluation unless otherwise noted.   COGNITION: Overall cognitive status: Within functional limits for tasks assessed    LOWER EXTREMITY ROM:     Active  Right Eval Left Eval  Hip flexion    Hip extension    Hip abduction    Hip adduction    Hip internal rotation    Hip external rotation    Knee flexion    Knee extension    Ankle dorsiflexion    Ankle plantarflexion    Ankle inversion    Ankle eversion     (Blank rows = not tested)  LOWER EXTREMITY MMT:    MMT Right Eval Left Eval  Hip flexion 4+ 4  Hip extension    Hip abduction 4+ 4+  Hip adduction 4+ 4+  Hip internal rotation    Hip external rotation    Knee flexion 4+ 4+  Knee extension 4+ 4  Ankle dorsiflexion 4 4  Ankle plantarflexion 4 4  Ankle inversion    Ankle eversion    (Blank rows = not tested)  CURB:  Pt reports unsteadiness with curb navigation   FUNCTIONAL TESTS:  5 times sit to stand: 18.71 sec Timed up and go (TUG): 8.30 sec 6 minute walk test: 1120 ft 10 meter walk test: 9.54 sec, 1.04 m/s   [facility] PT Assessment - 12/28/23 0001       Standardized Balance  Assessment   Standardized Balance Assessment Berg Balance Test      Berg Balance Test   Sit to Stand Able to stand  independently using hands    Standing Unsupported Able to stand safely 2 minutes    Sitting with Back Unsupported but Feet Supported on Floor or Stool Able to sit safely and securely 2 minutes    Stand to Sit Sits safely with minimal use of hands    Transfers Able to transfer safely, minor use of hands    Standing Unsupported with Eyes Closed Able to stand 10 seconds safely    Standing Unsupported with Feet Together Able to place feet together independently and stand 1 minute safely    From Standing, Reach Forward with Outstretched Arm Can reach forward >12 cm safely (5)    From Standing Position, Pick up Object from Floor Able to pick up shoe, needs supervision    From Standing Position, Turn to Look Behind Over each Shoulder Turn sideways only but maintains balance    Turn 360 Degrees Able to turn 360 degrees safely but slowly    Standing Unsupported, Alternately Place Feet on Step/Stool Able to stand independently and safely and complete 8 steps in 20 seconds    Standing Unsupported, One Foot in Front Able to take small step independently and hold 30 seconds    Standing on One Leg Able to lift leg independently and hold 5-10 seconds    Total Score 46           PATIENT SURVEYS:  ABC scale: 64%  TREATMENT DATE: 12/28/23   SELF CARE  Patient instructed in plan of care, findings for evaluation, and ways of physical therapy may improve their function and quality of life.    PATIENT EDUCATION: Education details: POC Person educated: Patient Education method: Explanation Education comprehension: verbalized understanding   HOME EXERCISE PROGRAM: Establish visit 2     GOALS: Goals reviewed with patient? Yes  SHORT TERM GOALS: Target  date: 02/08/2024  Pt will be independent with HEP to improve strength/mobility/balance for better functional independence with ADL's. Baseline: To be provided 2nd session Goal status: INITIAL   LONG TERM GOALS: Target date: 03/21/2024  Pt will complete 5XSTS in <13 seconds to demonstrate increased LE strength and balance. Baseline: 18.71 Goal status: INITIAL  2.  Pt will increase confidence in walking around the house to 50% to demonstrate improved household ambulation. Baseline: 0% per ABC scale Goal status: INITIAL  3.  Pt will improve BERG balance score by 6 points or more to demonstrate decreased fall risk during functional activities. Baseline: 46 Goal status: INITIAL  4.  Pt will improve FGA score by 4 points or more to demonstrate decreased fall risk and improved dynamic balance. Baseline: To be assessed 2nd visit Goal status: INITIAL  5.  Pt will improve ABC score by 10% or greater to indicate improved confidence with completion of daily tasks  Baseline: 64% Goal status: INITIAL    ASSESSMENT:  CLINICAL IMPRESSION:  Patient is a 78 y.o. male who was seen today for physical therapy evaluation and treatment for instability of gait. Pt completed 5xSTS, , , TUG, BERG, and ABC scale. Pt score for , , and TUG were all within age matched norms demonstrating sufficient speed and endurance for community ambulation. However, pts 5xSTS time and BERG score indicate increased risk for falls. Pt also demonstrates decreased confidence in ability to maintain balance, namely in household ambulation and stairs. Next session will continue to assess dynamic balance with the FGA. Pt will benefit from skilled physical therapy intervention to address impairments, improve QOL, and attain therapy goals.    OBJECTIVE IMPAIRMENTS: decreased balance, decreased strength, and dizziness.   ACTIVITY LIMITATIONS: carrying, lifting, bending, squatting, and stairs  PARTICIPATION  LIMITATIONS: shopping and community activity  PERSONAL FACTORS: Age and 3+ comorbidities: CHF, CKD, HTN, pulmonary HTN are also affecting patient's functional outcome.   REHAB POTENTIAL: Good  CLINICAL DECISION MAKING: Evolving/moderate complexity  EVALUATION COMPLEXITY: Moderate  PLAN:  PT FREQUENCY: 2x/week  PT DURATION: 12 weeks  PLANNED INTERVENTIONS: 97110-Therapeutic exercises, 97530- Therapeutic activity, 97112- Neuromuscular re-education, 97535- Self Care, [id number]- Manual therapy, [phone]- Gait training, Patient/Family education, Balance training, Stair training, and Vestibular training  PLAN FOR NEXT SESSION:  FGA HEP  Leonor Rode, SPT   This entire session was performed under direct supervision and direction of a licensed estate agent . I have personally read, edited and approve of the note as written.    This licensed clinician was present and actively directing care throughout the session at all times.  Lonni KATHEE Gainer PT ,DPT Physical Therapist- [place]  [facility]

## 2023-12-29 ENCOUNTER — Encounter

## 2023-12-29 DIAGNOSIS — I5042 Chronic combined systolic (congestive) and diastolic (congestive) heart failure: Secondary | ICD-10-CM

## 2023-12-29 NOTE — Progress Notes (Signed)
 Pulmonary Individual Treatment Plan  Patient Details  Name: Chase Boyd MRN: [id number] Date of Birth: 1945/10/14 Referring Provider:   Flowsheet Row Pulmonary Rehab from 09/30/2023 in [facility] Cardiac and Pulmonary Rehab  Referring Provider Dr. Prentice Flatten, MD    Initial Encounter Date:  Flowsheet Row Pulmonary Rehab from 09/30/2023 in [facility] Cardiac and Pulmonary Rehab  Date 09/30/23    Visit Diagnosis: Heart failure, systolic and diastolic, chronic (HCC)  Patient's Home Medications on Admission:  Current Outpatient Medications:    amiodarone (PACERONE) 200 MG tablet, Take by mouth. (Patient not taking: Reported on 09/29/2023), Disp: , Rfl:    amLODipine (NORVASC) 5 MG tablet, Take by mouth. (Patient not taking: Reported on 09/29/2023), Disp: , Rfl:    amLODipine (NORVASC) 5 MG tablet, Take 5 mg by mouth daily., Disp: , Rfl:    apixaban (ELIQUIS) 5 MG TABS tablet, Take by mouth. (Patient not taking: Reported on 09/29/2023), Disp: , Rfl:    apixaban (ELIQUIS) 5 MG TABS tablet, Take 5 mg by mouth., Disp: , Rfl:    ARIPiprazole (ABILIFY) 10 MG tablet, Take 10 mg by mouth., Disp: , Rfl:    buPROPion (WELLBUTRIN XL) 150 MG 24 hr tablet, Take by mouth., Disp: , Rfl:    buPROPion (WELLBUTRIN XL) 300 MG 24 hr tablet, Take 300 mg by mouth daily., Disp: , Rfl:    colestipol (COLESTID) 1 g tablet, Take 2 g by mouth., Disp: , Rfl:    escitalopram (LEXAPRO) 20 MG tablet, Take by mouth. (Patient not taking: Reported on 09/29/2023), Disp: , Rfl:    finasteride (PROSCAR) 5 MG tablet, Take 1 tablet PO daily (Patient not taking: Reported on 09/29/2023), Disp: , Rfl:    finasteride (PROSCAR) 5 MG tablet, Take 5 mg by mouth daily., Disp: , Rfl:    furosemide (LASIX) 20 MG tablet, Take by mouth., Disp: , Rfl:    metoprolol succinate (TOPROL-XL) 25 MG 24 hr tablet, Take 25 mg by mouth daily., Disp: , Rfl:    Multiple Vitamin (MULTI-VITAMIN) tablet, Take by mouth., Disp: , Rfl:    Multiple Vitamins-Minerals  (ONE DAILY CALCIUM/IRON) TABS, Take by mouth. (Patient not taking: Reported on 09/29/2023), Disp: , Rfl:    PARoxetine (PAXIL) 30 MG tablet, Take 30 mg by mouth., Disp: , Rfl:    pravastatin (PRAVACHOL) 20 MG tablet, Take 20 mg by mouth., Disp: , Rfl:    tamsulosin (FLOMAX) 0.4 MG CAPS capsule, Take 0.4 mg by mouth., Disp: , Rfl:   Past Medical History: No past medical history on file.  Tobacco Use: Social History   Tobacco Use  Smoking Status Never  Smokeless Tobacco Never    Labs: Review Flowsheet        No data to display           Pulmonary Assessment Scores:  Pulmonary Assessment Scores     Row Name 09/30/23 0959         ADL UCSD   ADL Phase Entry     SOB Score total 31     Rest 1     Walk 1     Stairs 2     Bath 0     Dress 0     Shop 1       CAT Score   CAT Score 14       mMRC Score   mMRC Score 2        UCSD: Self-administered rating of dyspnea associated with activities of daily living (ADLs)  6-point scale (0 = not at all to 5 = maximal or unable to do because of breathlessness)  Scoring Scores range from 0 to 120.  Minimally important difference is 5 units  CAT: CAT can identify the health impairment of COPD patients and is better correlated with disease progression.  CAT has a scoring range of zero to 40. The CAT score is classified into four groups of low (less than 10), medium (10 - 20), high (21-30) and very high (31-40) based on the impact level of disease on health status. A CAT score over 10 suggests significant symptoms.  A worsening CAT score could be explained by an exacerbation, poor medication adherence, poor inhaler technique, or progression of COPD or comorbid conditions.  CAT MCID is 2 points  mMRC: mMRC (Modified Medical Research Council) Dyspnea Scale is used to assess the degree of baseline functional disability in patients of respiratory disease due to dyspnea. No minimal important difference is established. A decrease in  score of 1 point or greater is considered a positive change.   Pulmonary Function Assessment:   Exercise Target Goals: Exercise Program Goal: Individual exercise prescription set using results from initial 6 min walk test and THRR while considering  patient's activity barriers and safety.   Exercise Prescription Goal: Initial exercise prescription builds to 30-45 minutes a day of aerobic activity, 2-3 days per week.  Home exercise guidelines will be given to patient during program as part of exercise prescription that the participant will acknowledge.  Education: Aerobic Exercise: - Group verbal and visual presentation on the components of exercise prescription. Introduces F.I.T.T principle from ACSM for exercise prescriptions.  Reviews F.I.T.T. principles of aerobic exercise including progression. Written material provided at class time.   Education: Resistance Exercise: - Group verbal and visual presentation on the components of exercise prescription. Introduces F.I.T.T principle from ACSM for exercise prescriptions  Reviews F.I.T.T. principles of resistance exercise including progression. Written material provided at class time.    Education: Exercise & Equipment Safety: - Individual verbal instruction and demonstration of equipment use and safety with use of the equipment. Flowsheet Row Pulmonary Rehab from 09/30/2023 in [facility] Cardiac and Pulmonary Rehab  Date 09/29/23  Educator jh  Instruction Review Code 1- Verbalizes Understanding    Education: Exercise Physiology & General Exercise Guidelines: - Group verbal and written instruction with models to review the exercise physiology of the cardiovascular system and associated critical values. Provides general exercise guidelines with specific guidelines to those with heart or lung disease.    Education: Flexibility, Balance, Mind/Body Relaxation: - Group verbal and visual presentation with interactive activity on the components of  exercise prescription. Introduces F.I.T.T principle from ACSM for exercise prescriptions. Reviews F.I.T.T. principles of flexibility and balance exercise training including progression. Also discusses the mind body connection.  Reviews various relaxation techniques to help reduce and manage stress (i.e. Deep breathing, progressive muscle relaxation, and visualization). Balance handout provided to take home. Written material provided at class time.   Activity Barriers & Risk Stratification:  Activity Barriers & Cardiac Risk Stratification - 09/30/23 1020       Activity Barriers & Cardiac Risk Stratification   Activity Barriers Joint Problems;Deconditioning;Balance Concerns;Muscular Weakness          6 Minute Walk:  6 Minute Walk     Row Name 09/30/23 1018         6 Minute Walk   Phase Initial     Distance 1020 feet     Walk Time 6  minutes     # of Rest Breaks 0     MPH 1.93     METS 1.96     RPE 12     Perceived Dyspnea  1     VO2 Peak 6.87     Symptoms No     Resting HR 78 bpm     Resting BP 114/68     Resting Oxygen Saturation  97 %     Exercise Oxygen Saturation  during 6 min walk 93 %     Max Ex. HR 101 bpm     Max Ex. BP 134/82     2 Minute Post BP 128/78       Interval HR   1 Minute HR 93     2 Minute HR 94     3 Minute HR 97     4 Minute HR 97     5 Minute HR 97     6 Minute HR 101     2 Minute Post HR 86     Interval Heart Rate? Yes       Interval Oxygen   Interval Oxygen? Yes     Baseline Oxygen Saturation % 97 %     1 Minute Oxygen Saturation % 93 %     1 Minute Liters of Oxygen 0 L  RA     2 Minute Oxygen Saturation % 96 %     2 Minute Liters of Oxygen 0 L     3 Minute Oxygen Saturation % 96 %     3 Minute Liters of Oxygen 0 L     4 Minute Oxygen Saturation % 96 %     4 Minute Liters of Oxygen 0 L     5 Minute Oxygen Saturation % 93 %     5 Minute Liters of Oxygen 0 L     6 Minute Oxygen Saturation % 96 %     6 Minute Liters of Oxygen 0 L      2 Minute Post Oxygen Saturation % 97 %     2 Minute Post Liters of Oxygen 0 L       Oxygen Initial Assessment:   Oxygen Re-Evaluation:  Oxygen Re-Evaluation     Row Name 10/06/23 1620 11/01/23 1100 11/15/23 1142         Program Oxygen Prescription   Program Oxygen Prescription -- None None       Home Oxygen   Home Oxygen Device -- None None     Sleep Oxygen Prescription -- None --     Home Exercise Oxygen Prescription -- None None     Home Resting Oxygen Prescription -- None None       Goals/Expected Outcomes   Short Term Goals -- To learn and demonstrate proper pursed lip breathing techniques or other breathing techniques.  To learn and understand importance of maintaining oxygen saturations>88%;To learn and understand importance of monitoring SPO2 with pulse oximeter and demonstrate accurate use of the pulse oximeter.     Long  Term Goals -- Exhibits proper breathing techniques, such as pursed lip breathing or other method taught during program session Verbalizes importance of monitoring SPO2 with pulse oximeter and return demonstration;Maintenance of O2 saturations>88%     Comments Reviewed PLB technique with pt.  Talked about how it works and it's importance in maintaining their exercise saturations. Informed patient how to perform the Pursed Lipped breathing technique. Told patient to Inhale through the nose and out the  mouth with pursed lips to keep their airways open, help oxygenate them better, practice when at rest or doing strenuous activity. Patient Verbalizes understanding of technique and will work on and be reiterated during [facility]. Fitzroy reports that he does not have a pulse ox at home. Home exercise was reviewed with patient today and how to monitor HR and Sa O2 during home exericse was discussed. He was encouraged to purchase a pulse ox so that he can monitor these vitals at home during daily activities and exercise.     Goals/Expected Outcomes Short: Become more  profiecient at using PLB.   Long: Become independent at using PLB. Short: use PLB with exertion. Long: use PLB on exertion proficiently and independently. Short: buy a pulse ox and start monitoring at home. Long: independently monitor SaO2 levels at home.        Oxygen Discharge (Final Oxygen Re-Evaluation):  Oxygen Re-Evaluation - 11/15/23 1142       Program Oxygen Prescription   Program Oxygen Prescription None      Home Oxygen   Home Oxygen Device None    Home Exercise Oxygen Prescription None    Home Resting Oxygen Prescription None      Goals/Expected Outcomes   Short Term Goals To learn and understand importance of maintaining oxygen saturations>88%;To learn and understand importance of monitoring SPO2 with pulse oximeter and demonstrate accurate use of the pulse oximeter.    Long  Term Goals Verbalizes importance of monitoring SPO2 with pulse oximeter and return demonstration;Maintenance of O2 saturations>88%    Comments Daxx reports that he does not have a pulse ox at home. Home exercise was reviewed with patient today and how to monitor HR and Sa O2 during home exericse was discussed. He was encouraged to purchase a pulse ox so that he can monitor these vitals at home during daily activities and exercise.    Goals/Expected Outcomes Short: buy a pulse ox and start monitoring at home. Long: independently monitor SaO2 levels at home.          Initial Exercise Prescription:  Initial Exercise Prescription - 09/30/23 1000       Date of Initial Exercise RX and Referring Provider   Date 09/30/23    Referring Provider Dr. Prentice Flatten, MD      Oxygen   Maintain Oxygen Saturation 88% or higher      Treadmill   MPH 1.8    Grade 0    Minutes 15    METs 2.38      Recumbant Bike   Level 2    RPM 50    Watts 15    Minutes 15    METs 1.96      NuStep   Level 2    SPM 80    Minutes 15    METs 1.96      T5 Nustep   Level 1    SPM 80    Minutes 15    METs 1.96       Prescription Details   Frequency (times per week) 3    Duration Progress to 30 minutes of continuous aerobic without signs/symptoms of physical distress      Intensity   THRR 40-80% of Max Heartrate 103-129    Ratings of Perceived Exertion 11-13    Perceived Dyspnea 0-4      Progression   Progression Continue to progress workloads to maintain intensity without signs/symptoms of physical distress.      Paramedic  Prescription Yes    Weight 3 lb    Reps 10-15          Perform Capillary Blood Glucose checks as needed.  Exercise Prescription Changes:   Exercise Prescription Changes     Row Name 09/30/23 1000 10/19/23 0800 11/04/23 1000 11/15/23 1100 11/18/23 1600     Response to Exercise   Blood Pressure (Admit) 114/68 104/68 110/76 -- 112/66   Blood Pressure (Exercise) 134/82 126/60 142/88 -- 122/72   Blood Pressure (Exit) 128/78 112/68 110/80 -- 110/60   Heart Rate (Admit) 78 bpm 95 bpm 100 bpm -- 86 bpm   Heart Rate (Exercise) 101 bpm 114 bpm 126 bpm -- 108 bpm   Heart Rate (Exit) 86 bpm 90 bpm 95 bpm -- 100 bpm   Oxygen Saturation (Admit) 97 % 96 % 97 % -- 97 %   Oxygen Saturation (Exercise) 93 % 94 % 94 % -- 94 %   Oxygen Saturation (Exit) 97 % 97 % 98 % -- 98 %   Rating of Perceived Exertion (Exercise) 12 13 17  -- 15   Perceived Dyspnea (Exercise) 1 1 2  -- 1   Symptoms none none none -- none   Comments Results 1st 2 weeks of exercise sessions -- -- --   Duration -- Progress to 30 minutes of  aerobic without signs/symptoms of physical distress Progress to 30 minutes of  aerobic without signs/symptoms of physical distress Progress to 30 minutes of  aerobic without signs/symptoms of physical distress Progress to 30 minutes of  aerobic without signs/symptoms of physical distress   Intensity -- THRR unchanged THRR unchanged THRR unchanged THRR unchanged     Progression   Progression -- Continue to progress workloads to maintain intensity without  signs/symptoms of physical distress. Continue to progress workloads to maintain intensity without signs/symptoms of physical distress. Continue to progress workloads to maintain intensity without signs/symptoms of physical distress. Continue to progress workloads to maintain intensity without signs/symptoms of physical distress.   Average METs -- 2.16 2.52 2.52 2.64     Resistance Training   Training Prescription -- Yes Yes Yes Yes   Weight -- 3 lb 3 lb 3 lb 3 lb   Reps -- 10-15 10-15 10-15 10-15     Interval Training   Interval Training -- No No No No     Treadmill   MPH -- 2 2.5 2.5 2.5   Grade -- 0 1 1 0   Minutes -- 15 15 15 15    METs -- 2.53 3.26 3.26 2.91     Recumbant Bike   Level -- 2 10 10 7    Watts -- 15 15 15  39   Minutes -- 19 39 39 15   METs -- 2.54 3.39 3.39 3.38     NuStep   Level -- 4 3 3 3    Minutes -- 15 15 15 15    METs -- 2.3 2.1 2.1 2.6     Arm Ergometer   Level -- 1 1 1  1.2   Minutes -- 15 15 15 15    METs -- 1 1 1 1      Elliptical   Level -- -- 1 1 --   Speed -- -- 3 3 --   Minutes -- -- 15 15 --   METs -- -- 3 3 --     T5 Nustep   Level -- 1 2 2  --   Minutes -- 15 15 15  --   METs -- 1.9 1.9 1.9 --  Biostep-RELP   Level -- -- 1 1 1    SPM -- -- 50 50 --   Minutes -- -- 15 15 15    METs -- -- 3 3 2      Home Exercise Plan   Plans to continue exercise at -- -- -- Home (comment)  TM Home (comment)  TM   Frequency -- -- -- Add 2 additional days to program exercise sessions. Add 2 additional days to program exercise sessions.   Initial Home Exercises Provided -- -- -- 11/15/23 11/15/23     Oxygen   Maintain Oxygen Saturation -- 88% or higher 88% or higher 88% or higher 88% or higher    Row Name 12/01/23 1100 12/16/23 1000           Response to Exercise   Blood Pressure (Admit) 106/58 118/64      Blood Pressure (Exit) 94/60 102/58      Heart Rate (Admit) 93 bpm 89 bpm      Heart Rate (Exercise) 131 bpm 105 bpm      Heart Rate (Exit)  90 bpm 102 bpm      Oxygen Saturation (Admit) 97 % 97 %      Oxygen Saturation (Exercise) 94 % 95 %      Oxygen Saturation (Exit) 98 % 97 %      Rating of Perceived Exertion (Exercise) 15 14      Perceived Dyspnea (Exercise) 1 1      Symptoms none none      Duration Progress to 30 minutes of  aerobic without signs/symptoms of physical distress Progress to 30 minutes of  aerobic without signs/symptoms of physical distress      Intensity THRR unchanged THRR unchanged        Progression   Progression Continue to progress workloads to maintain intensity without signs/symptoms of physical distress. Continue to progress workloads to maintain intensity without signs/symptoms of physical distress.      Average METs 2.5 2.5        Resistance Training   Training Prescription Yes Yes      Weight 3 lb 3 lb      Reps 10-15 10-15        Interval Training   Interval Training No No        Treadmill   MPH 2.8 2.3      Grade 0 0.5      Minutes 15 15      METs 3.14 2.92        Recumbant Bike   Level 3.2 1.5      Watts 39 39      Minutes 15 15      METs 3.39 3.38        NuStep   Level 4 3      Minutes 15 15      METs 2.6 2.2        Arm Ergometer   Level 1.5 --      Minutes 15 --      METs 1 --        T5 Nustep   Level -- 1      Minutes -- 15      METs -- 2.1        Biostep-RELP   Level 2 2      Minutes 15 15      METs 2 2        Home Exercise Plan   Plans to continue exercise at Home (comment)  TM Home (comment)  TM      Frequency Add 2 additional days to program exercise sessions. Add 2 additional days to program exercise sessions.      Initial Home Exercises Provided 11/15/23 11/15/23        Oxygen   Maintain Oxygen Saturation 88% or higher 88% or higher         Exercise Comments:   Exercise Comments     Row Name 10/06/23 1617           Exercise Comments First full day of exercise!  Patient was oriented to gym and equipment including functions, settings,  policies, and procedures.  Patient's individual exercise prescription and treatment plan were reviewed.  All starting workloads were established based on the results of the 6 minute walk test done at initial orientation visit.  The plan for exercise progression was also introduced and progression will be customized based on patient's performance and goals.          Exercise Goals and Review:   Exercise Goals     Row Name 09/30/23 1020             Exercise Goals   Increase Physical Activity Yes       Intervention Provide advice, education, support and counseling about physical activity/exercise needs.;Develop an individualized exercise prescription for aerobic and resistive training based on initial evaluation findings, risk stratification, comorbidities and participant's personal goals.       Expected Outcomes Short Term: Attend rehab on a regular basis to increase amount of physical activity.;Long Term: Add in home exercise to make exercise part of routine and to increase amount of physical activity.;Long Term: Exercising regularly at least 3-5 days a week.       Increase Strength and Stamina Yes       Intervention Provide advice, education, support and counseling about physical activity/exercise needs.;Develop an individualized exercise prescription for aerobic and resistive training based on initial evaluation findings, risk stratification, comorbidities and participant's personal goals.       Expected Outcomes Short Term: Increase workloads from initial exercise prescription for resistance, speed, and METs.;Short Term: Perform resistance training exercises routinely during rehab and add in resistance training at home;Long Term: Improve cardiorespiratory fitness, muscular endurance and strength as measured by increased METs and functional capacity ( )       Able to understand and use rate of perceived exertion (RPE) scale Yes       Intervention Provide education and explanation on how to  use RPE scale       Expected Outcomes Short Term: Able to use RPE daily in rehab to express subjective intensity level;Long Term:  Able to use RPE to guide intensity level when exercising independently       Able to understand and use Dyspnea scale Yes       Intervention Provide education and explanation on how to use Dyspnea scale       Expected Outcomes Short Term: Able to use Dyspnea scale daily in rehab to express subjective sense of shortness of breath during exertion;Long Term: Able to use Dyspnea scale to guide intensity level when exercising independently       Knowledge and understanding of Target Heart Rate Range (THRR) Yes       Intervention Provide education and explanation of THRR including how the numbers were predicted and where they are located for reference       Expected Outcomes Short Term: Able to state/look up THRR;Short Term: Able to use  daily as guideline for intensity in rehab;Long Term: Able to use THRR to govern intensity when exercising independently       Able to check pulse independently Yes       Intervention Provide education and demonstration on how to check pulse in carotid and radial arteries.;Review the importance of being able to check your own pulse for safety during independent exercise       Expected Outcomes Short Term: Able to explain why pulse checking is important during independent exercise;Long Term: Able to check pulse independently and accurately       Understanding of Exercise Prescription Yes       Intervention Provide education, explanation, and written materials on patient's individual exercise prescription       Expected Outcomes Short Term: Able to explain program exercise prescription;Long Term: Able to explain home exercise prescription to exercise independently          Exercise Goals Re-Evaluation :  Exercise Goals Re-Evaluation     Row Name 10/06/23 1617 10/19/23 0843 11/04/23 1054 11/15/23 1137 11/18/23 1653     Exercise Goal  Re-Evaluation   Exercise Goals Review Increase Physical Activity;Able to understand and use rate of perceived exertion (RPE) scale;Knowledge and understanding of Target Heart Rate Range (THRR);Understanding of Exercise Prescription;Increase Strength and Stamina;Able to understand and use Dyspnea scale;Able to check pulse independently Increase Physical Activity;Understanding of Exercise Prescription;Increase Strength and Stamina Increase Physical Activity;Understanding of Exercise Prescription;Increase Strength and Stamina Increase Physical Activity;Increase Strength and Stamina;Able to understand and use rate of perceived exertion (RPE) scale;Able to understand and use Dyspnea scale;Knowledge and understanding of Target Heart Rate Range (THRR);Able to check pulse independently;Understanding of Exercise Prescription;Improve claudication pain tolerance and improve walking ability Increase Physical Activity;Understanding of Exercise Prescription;Increase Strength and Stamina   Comments Reviewed RPE and dyspnea scale, THR and program prescription with pt today.  Pt voiced understanding and was given a copy of goals to take home. Dione is off to a good start in the program and completed his first 2 weeks of exercise sessions in this review. He had a workload on the treadmill at a speed of 2 mph with no incline. He worked at level 4 on the T4 nustep, level 1 on the T5 nustep, level 2 on the recumbent bike, and level 1 on the arm ergometer. We will continue to monitor his progress in the program. Jailen is doing well in rehab. He increased his workload on the treadmill to a speed of 2. with 1% incline. He also increased to level 2 on the T5 nustep and level 10 on the recumbent bike. He tried the elliptical at level 1 and also the biostep at level 1. He maintained level 1 on the arm ergometer. We will continue to monitor his progress in the program. Reviewed home exercise with pt today from 11:24am to 11:34am.  Pt  plans to use his TM at home for exercise.  Reviewed THR, pulse, RPE, sign and symptoms, pulse oximetery and when to call 911 or MD.  Also discussed weather considerations and indoor options.  Pt voiced understanding. Caileb is doing well in rehab. He maintained his speed on the treadmill at 2.5 mph. He also maintained level 3 on the T4 nustep. He increased to level 1.2 on the arm ergometer. He decreased slightly on the recumbent bike to level 7. We will continue to monitor his progress in the program.   Expected Outcomes Short: Use RPE daily to regulate intensity.  Long: Follow program prescription  in THR. Short: Continue to follow current exercise prescription. Long: Continue exercise to improve strength and stamina. Short: Continue to progressively increase treadmill and nustep workloads. Long: Continue exercise to improve strength and stamina. Short: add 1-2 days a week of exercies at home on off days of rehab. Long: maintain independent exercise routine upon graduation from program. Short: Progressively increase recumbent bike workload back up when able. Long: Continue exercise to improve strength and stamina.    Row Name 12/01/23 1134 12/16/23 1051           Exercise Goal Re-Evaluation   Exercise Goals Review Increase Physical Activity;Understanding of Exercise Prescription;Increase Strength and Stamina Increase Physical Activity;Understanding of Exercise Prescription;Increase Strength and Stamina      Comments Josaiah continues to do well in rehab. He has been to increase his speed on the treadmill from 2.5 to 2.61mph. He was also able to increase from level 3 to 4 on the T4 nustep. We will continue to monitor his progress in the program. Verna continues to do well in rehab. He is due for his post soon and hopes to improve. He increased his incline on the treadmill to 0.5% with a speed of 2.3 mph. He worked at level 2 on the biostep, level 1 on the T5 nustep, level 1.5 on the recumbent bike,  and level 3 on the T4 nustep. We will continue to monitor his progress in the program.      Expected Outcomes Short: Continue to progress workloads on the treadmill. Long: Continue exercise to improve strength and stamina. Short: Improve on post . Long: Continue to increase overall METs and stamina.         Discharge Exercise Prescription (Final Exercise Prescription Changes):  Exercise Prescription Changes - 12/16/23 1000       Response to Exercise   Blood Pressure (Admit) 118/64    Blood Pressure (Exit) 102/58    Heart Rate (Admit) 89 bpm    Heart Rate (Exercise) 105 bpm    Heart Rate (Exit) 102 bpm    Oxygen Saturation (Admit) 97 %    Oxygen Saturation (Exercise) 95 %    Oxygen Saturation (Exit) 97 %    Rating of Perceived Exertion (Exercise) 14    Perceived Dyspnea (Exercise) 1    Symptoms none    Duration Progress to 30 minutes of  aerobic without signs/symptoms of physical distress    Intensity THRR unchanged      Progression   Progression Continue to progress workloads to maintain intensity without signs/symptoms of physical distress.    Average METs 2.5      Resistance Training   Training Prescription Yes    Weight 3 lb    Reps 10-15      Interval Training   Interval Training No      Treadmill   MPH 2.3    Grade 0.5    Minutes 15    METs 2.92      Recumbant Bike   Level 1.5    Watts 39    Minutes 15    METs 3.38      NuStep   Level 3    Minutes 15    METs 2.2      T5 Nustep   Level 1    Minutes 15    METs 2.1      Biostep-RELP   Level 2    Minutes 15    METs 2      Home Exercise Plan  Plans to continue exercise at Home (comment)   TM   Frequency Add 2 additional days to program exercise sessions.    Initial Home Exercises Provided 11/15/23      Oxygen   Maintain Oxygen Saturation 88% or higher          Nutrition:  Target Goals: Understanding of nutrition guidelines, daily intake of sodium 1500mg , cholesterol 200mg , calories  30% from fat and 7% or less from saturated fats, daily to have 5 or more servings of fruits and vegetables.  Education: Nutrition 1 -Group instruction provided by verbal, written material, interactive activities, discussions, models, and posters to present general guidelines for heart healthy nutrition including macronutrients, label reading, and promoting whole foods over processed counterparts. Education serves as pensions consultant of discussion of heart healthy eating for all. Written material provided at class time.     Education: Nutrition 2 -Group instruction provided by verbal, written material, interactive activities, discussions, models, and posters to present general guidelines for heart healthy nutrition including sodium, cholesterol, and saturated fat. Providing guidance of habit forming to improve blood pressure, cholesterol, and body weight. Written material provided at class time.     Biometrics:  Pre Biometrics - 09/30/23 1021       Pre Biometrics   Height 5' 9.5 (1.765 m)    Weight 198 lb 1.6 oz (89.9 kg)    Waist Circumference 39 inches    Hip Circumference 40 inches    Waist to Hip Ratio 0.98 %    BMI (Calculated) 28.84    Single Leg Stand 10.13 seconds           Nutrition Therapy Plan and Nutrition Goals:  Nutrition Therapy & Goals - 09/30/23 1002       Intervention Plan   Intervention Prescribe, educate and counsel regarding individualized specific dietary modifications aiming towards targeted core components such as weight, hypertension, lipid management, diabetes, heart failure and other comorbidities.    Expected Outcomes Short Term Goal: Understand basic principles of dietary content, such as calories, fat, sodium, cholesterol and nutrients.;Short Term Goal: A plan has been developed with personal nutrition goals set during dietitian appointment.;Long Term Goal: Adherence to prescribed nutrition plan.          Nutrition Assessments:  MEDIFICTS Score  Key: >=70 Need to make dietary changes  40-70 Heart Healthy Diet <= 40 Therapeutic Level Cholesterol Diet  Flowsheet Row Pulmonary Rehab from 10/08/2023 in [facility]  Picture Your Plate Total Score on Admission 45   Picture Your Plate Scores: <59 Unhealthy dietary pattern with much room for improvement. 41-50 Dietary pattern unlikely to meet recommendations for good health and room for improvement. 51-60 More healthful dietary pattern, with some room for improvement.  >60 Healthy dietary pattern, although there may be some specific behaviors that could be improved.   Nutrition Goals Re-Evaluation:  Nutrition Goals Re-Evaluation     Row Name 11/01/23 1101             Goals   Comment Patient was informed on why it is important to maintain a balanced diet when dealing with Respiratory issues. Explained that it takes a lot of energy to breath and when they are short of breath often they will need to have a good diet to help keep up with the calories they are expending for breathing.       Expected Outcome Short: Choose and plan snacks accordingly to patients caloric intake to improve breathing. Long: Maintain a diet independently  that meets their caloric intake to aid in daily shortness of breath.          Nutrition Goals Discharge (Final Nutrition Goals Re-Evaluation):  Nutrition Goals Re-Evaluation - 11/01/23 1101       Goals   Comment Patient was informed on why it is important to maintain a balanced diet when dealing with Respiratory issues. Explained that it takes a lot of energy to breath and when they are short of breath often they will need to have a good diet to help keep up with the calories they are expending for breathing.    Expected Outcome Short: Choose and plan snacks accordingly to patients caloric intake to improve breathing. Long: Maintain a diet independently that meets their caloric intake to aid in daily shortness of breath.           Psychosocial: Target Goals: Acknowledge presence or absence of significant depression and/or stress, maximize coping skills, provide positive support system. Participant is able to verbalize types and ability to use techniques and skills needed for reducing stress and depression.   Education: Stress, Anxiety, and Depression - Group verbal and visual presentation to define topics covered.  Reviews how body is impacted by stress, anxiety, and depression.  Also discusses healthy ways to reduce stress and to treat/manage anxiety and depression.  Written material provided at class time.   Education: Sleep Hygiene -Provides group verbal and written instruction about how sleep can affect your health.  Define sleep hygiene, discuss sleep cycles and impact of sleep habits. Review good sleep hygiene tips.    Initial Review & Psychosocial Screening:  Initial Psych Review & Screening - 09/29/23 1426       Initial Review   Current issues with Current Psychotropic Meds;History of Depression;Current Depression;Current Anxiety/Panic      Family Dynamics   Good Support System? Yes    Comments Arris states that his depression is there and has a family history of depression. He can look to his spouse for support.      Barriers   Psychosocial barriers to participate in program There are no identifiable barriers or psychosocial needs.;The patient should benefit from training in stress management and relaxation.      Screening Interventions   Interventions To provide support and resources with identified psychosocial needs;Provide feedback about the scores to participant;Encouraged to exercise    Expected Outcomes Short Term goal: Utilizing psychosocial counselor, staff and physician to assist with identification of specific Stressors or current issues interfering with healing process. Setting desired goal for each stressor or current issue identified.;Long Term Goal: Stressors or current issues are  controlled or eliminated.;Short Term goal: Identification and review with participant of any Quality of Life or Depression concerns found by scoring the questionnaire.;Long Term goal: The participant improves quality of Life and PHQ9 Scores as seen by post scores and/or verbalization of changes          Quality of Life Scores:  Scores of 19 and below usually indicate a poorer quality of life in these areas.  A difference of  2-3 points is a clinically meaningful difference.  A difference of 2-3 points in the total score of the Quality of Life Index has been associated with significant improvement in overall quality of life, self-image, physical symptoms, and general health in studies assessing change in quality of life.  PHQ-9: Review Flowsheet  More data exists      11/01/2023 09/30/2023 09/06/2019 08/02/2019 07/26/2019  Depression screen PHQ 2/9  Decreased Interest  3 2 1 1 1   Down, Depressed, Hopeless 3 2 1  0 1  PHQ - 2 Score 6 4 2 1 2   Altered sleeping 2 2 0 0 0  Tired, decreased energy 3 2 1 1 1   Change in appetite 1 1 0 0 0  Feeling bad or failure about yourself  0 0 0 0 0  Trouble concentrating 2 2 0 0 0  Moving slowly or fidgety/restless 1 1 1  0 0  Suicidal thoughts 0 0 0 0 0  PHQ-9 Score 15  12  4  2  3    Difficult doing work/chores Not difficult at all Somewhat difficult Not difficult at all Not difficult at all Not difficult at all    Details       Data saved with a previous flowsheet row definition        Interpretation of Total Score  Total Score Depression Severity:  1-4 = Minimal depression, 5-9 = Mild depression, 10-14 = Moderate depression, 15-19 = Moderately severe depression, 20-27 = Severe depression   Psychosocial Evaluation and Intervention:  Psychosocial Evaluation - 09/29/23 1428       Psychosocial Evaluation & Interventions   Interventions Encouraged to exercise with the program and follow exercise prescription;Relaxation education;Stress management  education    Comments Yahel states that his depression is there and has a family history of depression. He can look to his spouse for support.    Expected Outcomes Short: Start [facility] to help with mood. Long: Maintain a healthy mental state    Continue Psychosocial Services  Follow up required by staff          Psychosocial Re-Evaluation:  Psychosocial Re-Evaluation     Row Name 11/01/23 1105             Psychosocial Re-Evaluation   Current issues with Current Depression;History of Depression;Current Psychotropic Meds;Current Stress Concerns       Comments Reviewed patient health questionnaire (PHQ-9) with patient for follow up. Previously, patients score indicated signs/symptoms of depression.  Reviewed to see if patient is improving symptom wise while in program.  Score declined and patient states that it is because he has chronic depression and a lack of energy.       Expected Outcomes Short: Continue to work toward an improvement in PHQ9 scores by attending [facility] regularly. Long: Continue to improve stress and depression coping skills by talking with staff and attending [facility] regularly and work toward a positive mental state.       Interventions Encouraged to attend Pulmonary Rehabilitation for the exercise       Continue Psychosocial Services  Follow up required by staff          Psychosocial Discharge (Final Psychosocial Re-Evaluation):  Psychosocial Re-Evaluation - 11/01/23 1105       Psychosocial Re-Evaluation   Current issues with Current Depression;History of Depression;Current Psychotropic Meds;Current Stress Concerns    Comments Reviewed patient health questionnaire (PHQ-9) with patient for follow up. Previously, patients score indicated signs/symptoms of depression.  Reviewed to see if patient is improving symptom wise while in program.  Score declined and patient states that it is because he has chronic depression and a lack of energy.    Expected Outcomes  Short: Continue to work toward an improvement in PHQ9 scores by attending [facility] regularly. Long: Continue to improve stress and depression coping skills by talking with staff and attending [facility] regularly and work toward a positive mental state.    Interventions Encouraged to  attend Pulmonary Rehabilitation for the exercise    Continue Psychosocial Services  Follow up required by staff          Education: Education Goals: Education classes will be provided on a weekly basis, covering required topics. Participant will state understanding/return demonstration of topics presented.  Learning Barriers/Preferences:  Learning Barriers/Preferences - 09/29/23 1426       Learning Barriers/Preferences   Learning Barriers Hearing    Learning Preferences None          General Pulmonary Education Topics:  Infection Prevention: - Provides verbal and written material to individual with discussion of infection control including proper hand washing and proper equipment cleaning during exercise session. Flowsheet Row Pulmonary Rehab from 09/30/2023 in [facility] Cardiac and Pulmonary Rehab  Date 09/29/23  Educator jh  Instruction Review Code 1- Verbalizes Understanding    Falls Prevention: - Provides verbal and written material to individual with discussion of falls prevention and safety. Flowsheet Row Pulmonary Rehab from 09/30/2023 in [facility] Cardiac and Pulmonary Rehab  Date 09/29/23  Educator jh  Instruction Review Code 1- Verbalizes Understanding    Chronic Lung Disease Review: - Group verbal instruction with posters, models, PowerPoint presentations and videos,  to review new updates, new respiratory medications, new advancements in procedures and treatments. Providing information on websites and 800 numbers for continued self-education. Includes information about supplement oxygen, available portable oxygen systems, continuous and intermittent flow rates, oxygen safety, concentrators,  and Medicare reimbursement for oxygen. Explanation of Pulmonary Drugs, including class, frequency, complications, importance of spacers, rinsing mouth after steroid MDI's, and proper cleaning methods for nebulizers. Review of basic lung anatomy and physiology related to function, structure, and complications of lung disease. Review of risk factors. Discussion about methods for diagnosing sleep apnea and types of masks and machines for OSA. Includes a review of the use of types of environmental controls: home humidity, furnaces, filters, dust mite/pet prevention, HEPA vacuums. Discussion about weather changes, air quality and the benefits of nasal washing. Instruction on Warning signs, infection symptoms, calling MD promptly, preventive modes, and value of vaccinations. Review of effective airway clearance, coughing and/or vibration techniques. Emphasizing that all should Create an Action Plan. Written material provided at class time. Flowsheet Row Cardiac Rehab from 08/30/2019 in [facility] Cardiac and Pulmonary Rehab  Date 08/30/19  Educator jh  Instruction Review Code 1- Verbalizes Understanding    AED/CPR: - Group verbal and written instruction with the use of models to demonstrate the basic use of the AED with the basic ABC's of resuscitation.    Tests and Procedures:  - Group verbal and visual presentation and models provide information about basic cardiac anatomy and function. Reviews the testing methods done to diagnose heart disease and the outcomes of the test results. Describes the treatment choices: Medical Management, Angioplasty, or Coronary Bypass Surgery for treating various heart conditions including Myocardial Infarction, Angina, Valve Disease, and Cardiac Arrhythmias.  Written material provided at class time.   Medication Safety: - Group verbal and visual instruction to review commonly prescribed medications for heart and lung disease. Reviews the medication, class of the drug, and side  effects. Includes the steps to properly store meds and maintain the prescription regimen.  Written material given at graduation.   Other: -Provides group and verbal instruction on various topics (see comments)   Knowledge Questionnaire Score:  Knowledge Questionnaire Score - 09/30/23 1001       Knowledge Questionnaire Score   Pre Score 14/18  Core Components/Risk Factors/Patient Goals at Admission:  Personal Goals and Risk Factors at Admission - 09/29/23 1425       Core Components/Risk Factors/Patient Goals on Admission    Weight Management Yes;Weight Loss    Intervention Weight Management: Develop a combined nutrition and exercise program designed to reach desired caloric intake, while maintaining appropriate intake of nutrient and fiber, sodium and fats, and appropriate energy expenditure required for the weight goal.;Weight Management: Provide education and appropriate resources to help participant work on and attain dietary goals.;Weight Management/Obesity: Establish reasonable short term and long term weight goals.    Expected Outcomes Short Term: Continue to assess and modify interventions until short term weight is achieved;Long Term: Adherence to nutrition and physical activity/exercise program aimed toward attainment of established weight goal;Weight Loss: Understanding of general recommendations for a balanced deficit meal plan, which promotes 1-2 lb weight loss per week and includes a negative energy balance of [phone] kcal/d;Understanding recommendations for meals to include 15-35% energy as protein, 25-35% energy from fat, 35-60% energy from carbohydrates, less than 200mg  of dietary cholesterol, 20-35 gm of total fiber daily;Understanding of distribution of calorie intake throughout the day with the consumption of 4-5 meals/snacks    Improve shortness of breath with ADL's Yes    Intervention Provide education, individualized exercise plan and daily activity  instruction to help decrease symptoms of SOB with activities of daily living.    Expected Outcomes Short Term: Improve cardiorespiratory fitness to achieve a reduction of symptoms when performing ADLs;Long Term: Be able to perform more ADLs without symptoms or delay the onset of symptoms    Heart Failure Yes    Intervention Provide a combined exercise and nutrition program that is supplemented with education, support and counseling about heart failure. Directed toward relieving symptoms such as shortness of breath, decreased exercise tolerance, and extremity edema.    Expected Outcomes Improve functional capacity of life;Short term: Attendance in program 2-3 days a week with increased exercise capacity. Reported lower sodium intake. Reported increased fruit and vegetable intake. Reports medication compliance.;Short term: Daily weights obtained and reported for increase. Utilizing diuretic protocols set by physician.;Long term: Adoption of self-care skills and reduction of barriers for early signs and symptoms recognition and intervention leading to self-care maintenance.    Hypertension Yes    Intervention Provide education on lifestyle modifcations including regular physical activity/exercise, weight management, moderate sodium restriction and increased consumption of fresh fruit, vegetables, and low fat dairy, alcohol moderation, and smoking cessation.;Monitor prescription use compliance.    Expected Outcomes Short Term: Continued assessment and intervention until BP is < 140/24mm HG in hypertensive participants. < 130/16mm HG in hypertensive participants with diabetes, heart failure or chronic kidney disease.;Long Term: Maintenance of blood pressure at goal levels.    Lipids Yes    Intervention Provide education and support for participant on nutrition & aerobic/resistive exercise along with prescribed medications to achieve LDL 70mg , HDL >40mg .    Expected Outcomes Short Term: Participant states  understanding of desired cholesterol values and is compliant with medications prescribed. Participant is following exercise prescription and nutrition guidelines.;Long Term: Cholesterol controlled with medications as prescribed, with individualized exercise RX and with personalized nutrition plan. Value goals: LDL < 70mg , HDL > 40 mg.          Education:Diabetes - Individual verbal and written instruction to review signs/symptoms of diabetes, desired ranges of glucose level fasting, after meals and with exercise. Acknowledge that pre and post exercise glucose checks will be done for 3  sessions at entry of program.   Know Your Numbers and Heart Failure: - Group verbal and visual instruction to discuss disease risk factors for cardiac and pulmonary disease and treatment options.  Reviews associated critical values for Overweight/Obesity, Hypertension, Cholesterol, and Diabetes.  Discusses basics of heart failure: signs/symptoms and treatments.  Introduces Heart Failure Zone chart for action plan for heart failure. Written material provided at class time. Flowsheet Row Pulmonary Rehab from 09/30/2023 in [facility] Cardiac and Pulmonary Rehab  Education need identified 09/30/23    Core Components/Risk Factors/Patient Goals Review:    Core Components/Risk Factors/Patient Goals at Discharge (Final Review):    ITP Comments:  ITP Comments     Row Name 09/29/23 1431 09/30/23 0957 10/06/23 1054 10/06/23 1617 11/03/23 1127   ITP Comments Virtual Visit completed. Patient informed on EP and RD appointment and 6 Minute walk test. Patient also informed of patient health questionnaires on My Chart. Patient Verbalizes understanding. Visit diagnosis can be found in [facility] 06/03/2023. Completed and gym orientation for respiratory care services. Initial ITP created and sent for review to Dr. Faud Aleskerov, Medical Director. 30 Day review completed. Medical Director ITP review done; changes made as directed and  signed approval by Medical Director. New to program. First full day of exercise!  Patient was oriented to gym and equipment including functions, settings, policies, and procedures.  Patient's individual exercise prescription and treatment plan were reviewed.  All starting workloads were established based on the results of the 6 minute walk test done at initial orientation visit.  The plan for exercise progression was also introduced and progression will be customized based on patient's performance and goals. 30 Day review completed. Medical Director ITP review done; changes made as directed and signed approval by Medical Director. New to program.    Row Name 12/01/23 1112 12/29/23 1009         ITP Comments 30 Day review completed. Medical Director ITP review done; changes made as directed and signed approval by Medical Director. 30 Day review completed. Medical Director ITP review done; changes made as directed and signed approval by Medical Director.         Comments: 30 day review

## 2023-12-31 ENCOUNTER — Encounter: Payer: Self-pay | Admitting: Physical Therapy

## 2023-12-31 ENCOUNTER — Ambulatory Visit

## 2023-12-31 ENCOUNTER — Ambulatory Visit: Admitting: Physical Therapy

## 2023-12-31 ENCOUNTER — Encounter

## 2023-12-31 DIAGNOSIS — M6281 Muscle weakness (generalized): Secondary | ICD-10-CM

## 2023-12-31 DIAGNOSIS — R262 Difficulty in walking, not elsewhere classified: Secondary | ICD-10-CM | POA: Diagnosis not present

## 2023-12-31 DIAGNOSIS — R2681 Unsteadiness on feet: Secondary | ICD-10-CM

## 2023-12-31 NOTE — Therapy (Signed)
 OUTPATIENT PHYSICAL THERAPY NEURO EVALUATION   Patient Name: Chase Boyd MRN: [id number] DOB:October 01, 1945, 78 y.o., male Today's Date: 12/31/2023   PCP: Dr. Arley Liza Pam REFERRING PROVIDER: Dr. Arley Liza Pam  END OF SESSION:  PT End of Session - 12/31/23 0925     Visit Number 2    Number of Visits 24    Date for Recertification  03/21/24    PT Start Time 0930    PT Stop Time 1015    PT Time Calculation (min) 45 min    Equipment Utilized During Treatment Gait belt    Activity Tolerance Patient tolerated treatment well    Behavior During Therapy [facility] for tasks assessed/performed          History reviewed. No pertinent past medical history. History reviewed. No pertinent surgical history. There are no active problems to display for this patient.   ONSET DATE: Over a year  REFERRING DIAG: Instability of gait  THERAPY DIAG:  Difficulty in walking, not elsewhere classified  Unsteadiness on feet  Muscle weakness (generalized)  Rationale for Evaluation and Treatment: Rehabilitation  SUBJECTIVE:                                                                                                                                                                                             SUBJECTIVE STATEMENT: Pt reports doing well this morning. Pt reports he is having L arm and neck pain since his fall three weeks ago.   Pt accompanied by: self  PERTINENT HISTORY:   Patient presents to PT with c/c of balance and instability of gait. Pt believes instability began with foot pain. He reports seeing a podiatrist for the pain, in which he received a shoe insert. Pain has since subsided. Pt states his second toe crosses over great toe, with DPM recommending potential amputation if pain remains. Two weeks ago, patient experienced a fall which he believes was due to walking in the dark. Pt was walking from bedroom to kitchen, where he ran into fridge falling onto one  shoulder and then the other. Pt reports this is the only fall within the past 6 months, but walking and curbs have become more challenging and he feels hesitant. Pt has a past history of cardiac rehab following a mitral valve replacement. Pt currently works part-time as a proofreader. Overall, pts goals are to walk around with less hesitation.   PAIN:  Are you having pain? Yes, pain in L arm and cervical spine since fall 3 weeks ago. No pain in the foot anymore.  PRECAUTIONS: None  RED FLAGS: None   WEIGHT  BEARING RESTRICTIONS: No  FALLS: Has patient fallen in last 6 months? Yes. Number of falls 1 in the past 6 months that resulted in a shoulder injury.  LIVING ENVIRONMENT: Lives with: lives with their spouse Lives in: House/apartment Stairs: Yes: Internal: attic stairs, goes up seldomly Has following equipment at home:Cane   PLOF: Independent and Independent with basic ADLs  PATIENT GOALS: Pt would like to walk without thinking he is going to fall.   OBJECTIVE:  Note: Objective measures were completed at Evaluation unless otherwise noted.   COGNITION: Overall cognitive status: Within functional limits for tasks assessed    LOWER EXTREMITY ROM:     Active  Right Eval Left Eval  Hip flexion    Hip extension    Hip abduction    Hip adduction    Hip internal rotation    Hip external rotation    Knee flexion    Knee extension    Ankle dorsiflexion    Ankle plantarflexion    Ankle inversion    Ankle eversion     (Blank rows = not tested)  LOWER EXTREMITY MMT:    MMT Right Eval Left Eval  Hip flexion 4+ 4  Hip extension    Hip abduction 4+ 4+  Hip adduction 4+ 4+  Hip internal rotation    Hip external rotation    Knee flexion 4+ 4+  Knee extension 4+ 4  Ankle dorsiflexion 4 4  Ankle plantarflexion 4 4  Ankle inversion    Ankle eversion    (Blank rows = not tested)  CURB:  Pt reports unsteadiness with curb navigation   FUNCTIONAL TESTS:  5  times sit to stand: 18.71 sec Timed up and go (TUG): 8.30 sec 6 minute walk test: 1120 ft 10 meter walk test: 9.54 sec, 1.04 m/s    PATIENT SURVEYS:  ABC scale: 64%                                                                                                                              TREATMENT DATE: 12/31/23   -Administered FGA  Patient demonstrates increased fall risk as noted by score of 17/30 on  Functional Gait Assessment.   <22/30 = predictive of falls, <20/30 = fall in 6 months, <18/30 = predictive of falls in PD MCID: 5 points stroke population, 4 points geriatric population (ANPTA Core Set of Outcome Measures for Adults with Neurologic Conditions, 2018)  -4 laps in hallway with intermittent horizontal L and R head turns  -4 laps in hallway w L/R pivot turns  -1 lap back/forth over 4 foam bolsters placed 1.78ft apart on floor  -4 laps back/forth over 4 foam bolsters placed 1.75ft apart w/ 2# AW  -3 laps over 4 foam bolsters paced 1.58ft apart over red mat w 2# AW  -standing alternating heel/toe raises at bar w/ 2# AW, 3x8  -standing hip flexion at bar w/ B UE support, 2x10 w/ 2# AW  -  seated rows w/ RTB 2x8  *CGA/supervision given during all activities unless otherwise stated.[facility] PT Assessment - 12/31/23 0001       Functional Gait  Assessment   Gait assessed  Yes    Gait Level Surface Walks 20 ft in less than 5.5 sec, no assistive devices, good speed, no evidence for imbalance, normal gait pattern, deviates no more than 6 in outside of the 12 in walkway width.    Change in Gait Speed Able to smoothly change walking speed without loss of balance or gait deviation. Deviate no more than 6 in outside of the 12 in walkway width.    Gait with Horizontal Head Turns Performs head turns with moderate changes in gait velocity, slows down, deviates 10-15 in outside 12 in walkway width but recovers, can continue to walk.    Gait with Vertical Head Turns Performs task  with slight change in gait velocity (eg, minor disruption to smooth gait path), deviates 6 - 10 in outside 12 in walkway width or uses assistive device    Gait and Pivot Turn Turns slowly, requires verbal cueing, or requires several small steps to catch balance following turn and stop    Step Over Obstacle Is able to step over one shoe box (4.5 in total height) without changing gait speed. No evidence of imbalance.    Gait with Narrow Base of Support Ambulates less than 4 steps heel to toe or cannot perform without assistance.    Gait with Eyes Closed Walks 20 ft, uses assistive device, slower speed, mild gait deviations, deviates 6-10 in outside 12 in walkway width. Ambulates 20 ft in less than 9 sec but greater than 7 sec.    Ambulating Backwards Walks 20 ft, slow speed, abnormal gait pattern, evidence for imbalance, deviates 10-15 in outside 12 in walkway width.    Steps Alternating feet, must use rail.    Total Score 17            PATIENT EDUCATION: Education details: POC Person educated: Patient Education method: Explanation Education comprehension: verbalized understanding  HOME EXERCISE PROGRAM: Access Code: [email or web address] URL: [email or web address] Date: 12/31/2023 Prepared by: Massie Dollar  Exercises - Standing March with Counter Support  - 1 x daily - 7 x weekly - 3 sets - 10 reps - Heel Toe Raises with Counter Support  - 1 x daily - 7 x weekly - 3 sets - 10 reps - Seated Shoulder Row with Anchored Resistance  - 1 x daily - 7 x weekly - 2 sets - 12 reps  GOALS: Goals reviewed with patient? Yes  SHORT TERM GOALS: Target date: 02/08/2024  Pt will be independent with HEP to improve strength/mobility/balance for better functional independence with ADL's. Baseline: To be provided 2nd session Goal status: INITIAL   LONG TERM GOALS: Target date: 03/21/2024  Pt will complete 5XSTS in <13 seconds to demonstrate increased LE strength and balance. Baseline:  18.71 Goal status: INITIAL  2.  Pt will increase confidence in walking around the house to 50% to demonstrate improved household ambulation. Baseline: 0% per ABC scale Goal status: INITIAL  3.  Pt will improve BERG balance score by 6 points or more to demonstrate decreased fall risk during functional activities. Baseline: 46 Goal status: INITIAL  4.  Pt will improve FGA score by 4 points or more to demonstrate decreased fall risk and improved dynamic balance. Baseline: To be assessed 2nd visit Goal status: INITIAL  5.  Pt will improve ABC score  by 10% or greater to indicate improved confidence with completion of daily tasks  Baseline: 64% Goal status: INITIAL    ASSESSMENT:  CLINICAL IMPRESSION: Pt arrived to session motivated to participate in activities. Began with FGA for a better picture of dynamic balance ability, and patient demonstrates increased fall risk as noted by score of 17/30 on  Functional Gait Assessment, indicating a prediction of a fall within 6 months. Most difficulty noted with horizontal and vertical head turns, narrow BoS, stepping over higher objects, and backwards ambulation. Pt showed most difficulty with head turing to the R during laps performed in hallway, showed decreased speed and R sway. Pt reported feeling slightly dizzy and off following all laps of ambulation in the hallway, but showed no significant LOB. Pt showed no difficulty in initial lap of step over foam rolls, so 2# AW and increased space between bolsters was added for increased challenge. Pt showed good ability of ambulation over red foam mat with 2# AW, with only one misstep while stepping onto mat, but showed quick ability to correct. Pt noted mild fatigue but showed no SOB and was motivated to continue activity without breaks. Pt educated and performed HEP exercises at end of session and showed good form and understanding of activities. Pt will continue to benefit from skilled therapy to  address remaining deficits in order to improve overall QoL and return to PLOF.    OBJECTIVE IMPAIRMENTS: decreased balance, decreased strength, and dizziness.   ACTIVITY LIMITATIONS: carrying, lifting, bending, squatting, and stairs  PARTICIPATION LIMITATIONS: shopping and community activity  PERSONAL FACTORS: Age and 3+ comorbidities: CHF, CKD, HTN, pulmonary HTN are also affecting patient's functional outcome.   REHAB POTENTIAL: Good  CLINICAL DECISION MAKING: Evolving/moderate complexity  EVALUATION COMPLEXITY: Moderate  PLAN:  PT FREQUENCY: 2x/week  PT DURATION: 12 weeks  PLANNED INTERVENTIONS: 97110-Therapeutic exercises, 97530- Therapeutic activity, 97112- Neuromuscular re-education, 97535- Self Care, [id number]- Manual therapy, [phone]- Gait training, Patient/Family education, Balance training, Stair training, and Vestibular training  PLAN FOR NEXT SESSION:  -continue high level dynamic balance training, narrowing BoS during ambulation -work on ambulation with horizontal/vertical head turns -continue increasing LE strength -expand HEP  Renna Helling, SPT

## 2024-01-01 ENCOUNTER — Ambulatory Visit
Admission: RE | Admit: 2024-01-01 | Discharge: 2024-01-01 | Disposition: A | Discharge status: Home / Self Care | Source: Ambulatory Visit | Attending: Orthopedic Surgery | Admitting: Orthopedic Surgery

## 2024-01-01 DIAGNOSIS — S46002A Unspecified injury of muscle(s) and tendon(s) of the rotator cuff of left shoulder, initial encounter: Secondary | ICD-10-CM

## 2024-01-03 ENCOUNTER — Ambulatory Visit

## 2024-01-04 ENCOUNTER — Ambulatory Visit: Admitting: Physical Therapy

## 2024-01-04 ENCOUNTER — Encounter: Attending: Internal Medicine

## 2024-01-04 ENCOUNTER — Encounter

## 2024-01-04 DIAGNOSIS — I5042 Chronic combined systolic (congestive) and diastolic (congestive) heart failure: Secondary | ICD-10-CM | POA: Insufficient documentation

## 2024-01-04 NOTE — Progress Notes (Signed)
 Daily Session Note  Patient Details  Name: Chase Boyd MRN: [id number] Date of Birth: [birth year] Referring Provider:   Flowsheet Row Pulmonary Rehab from 09/30/2023 in [facility] Cardiac and Pulmonary Rehab  Referring Provider Dr. Prentice Flatten, MD    Encounter Date: 01/04/2024  Check In:  Session Check In - 01/04/24 1059       Check-In   Supervising physician immediately available to respond to emergencies See telemetry face sheet for immediately available ER MD    Location [facility]-Cardiac & Pulmonary Rehab    Staff Present Burnard Davenport RN,BSN,MPA;Maxon Conetta BS, Exercise Physiologist;Margaret Best, MS, Exercise Physiologist;Jason Elnor RDN,LDN    Virtual Visit No    Medication changes reported     No    Fall or balance concerns reported    No    Tobacco Cessation No Change    Warm-up and Cool-down Performed on first and last piece of equipment    Resistance Training Performed Yes    VAD Patient? No    PAD/SET Patient? No      Pain Assessment   Currently in Pain? No/denies             Social History   Tobacco Use  Smoking Status Never  Smokeless Tobacco Never    Goals Met:  Proper associated with RPD/PD & O2 Sat Independence with exercise equipment Using PLB without cueing & demonstrates good technique Exercise tolerated well No report of concerns or symptoms today Strength training completed today  Goals Unmet:  Not Applicable  Comments: Pt able to follow exercise prescription today without complaint.  Will continue to monitor for progression.    Dr. Oneil Pinal is Medical Director for [facility] Cardiac Rehabilitation.  Dr. Fuad Aleskerov is Medical Director for [facility] Pulmonary Rehabilitation.

## 2024-01-05 ENCOUNTER — Ambulatory Visit

## 2024-01-11 ENCOUNTER — Encounter

## 2024-01-11 ENCOUNTER — Ambulatory Visit: Admitting: Physical Therapy

## 2024-01-11 DIAGNOSIS — I5042 Chronic combined systolic (congestive) and diastolic (congestive) heart failure: Secondary | ICD-10-CM | POA: Insufficient documentation

## 2024-01-11 NOTE — Progress Notes (Signed)
 Daily Session Note  Patient Details  Name: Chase Boyd MRN: [id number] Date of Birth: Sep 04, 1945 Referring Provider:   Flowsheet Row Pulmonary Rehab from 09/30/2023 in [facility] Cardiac and Pulmonary Rehab  Referring Provider Dr. Prentice Flatten, MD    Encounter Date: 01/11/2024  Check In:  Session Check In - 01/11/24 1103       Check-In   Supervising physician immediately available to respond to emergencies See telemetry face sheet for immediately available ER MD    Location [facility]-Cardiac & Pulmonary Rehab    Staff Present Burnard Davenport RN,BSN,MPA;Maxon Conetta BS, Exercise Physiologist;Margaret Best, MS, Exercise Physiologist;Jason Elnor RDN,LDN;Noah Tickle, BS, Exercise Physiologist;Meredith Tressa RN,BSN    Virtual Visit No    Medication changes reported     No    Fall or balance concerns reported    No    Tobacco Cessation No Change    Warm-up and Cool-down Performed on first and last piece of equipment    Resistance Training Performed Yes    VAD Patient? No    PAD/SET Patient? No      Pain Assessment   Currently in Pain? No/denies             Social History   Tobacco Use  Smoking Status Never  Smokeless Tobacco Never    Goals Met:  Proper associated with RPD/PD & O2 Sat Independence with exercise equipment Using PLB without cueing & demonstrates good technique Exercise tolerated well No report of concerns or symptoms today Strength training completed today  Goals Unmet:  Not Applicable  Comments: Pt able to follow exercise prescription today without complaint.  Will continue to monitor for progression.    Dr. Oneil Pinal is Medical Director for [facility] Cardiac Rehabilitation.  Dr. Fuad Aleskerov is Medical Director for [facility] Pulmonary Rehabilitation.

## 2024-01-14 ENCOUNTER — Ambulatory Visit: Admitting: Physical Therapy

## 2024-01-14 ENCOUNTER — Encounter

## 2024-01-14 VITALS — Ht 69.5 in | Wt 192.7 lb

## 2024-01-14 DIAGNOSIS — I5042 Chronic combined systolic (congestive) and diastolic (congestive) heart failure: Secondary | ICD-10-CM | POA: Diagnosis not present

## 2024-01-14 NOTE — Patient Instructions (Signed)
 Discharge Patient Instructions  Patient Details  Name: Chase Boyd MRN: [id number] Date of Birth: Jun 26, 1945 Referring Provider:  No ref. provider found   Number of Visits: 36  Reason for Discharge:  Patient reached a stable level of exercise. Patient independent in their exercise. Patient has met program and personal goals.   Diagnosis:  Heart failure, systolic and diastolic, chronic (HCC)  Initial Exercise Prescription:  Initial Exercise Prescription - 09/30/23 1000       Date of Initial Exercise RX and Referring Provider   Date 09/30/23    Referring Provider Dr. Prentice Flatten, MD      Oxygen   Maintain Oxygen Saturation 88% or higher      Treadmill   MPH 1.8    Grade 0    Minutes 15    METs 2.38      Recumbant Bike   Level 2    RPM 50    Watts 15    Minutes 15    METs 1.96      NuStep   Level 2    SPM 80    Minutes 15    METs 1.96      T5 Nustep   Level 1    SPM 80    Minutes 15    METs 1.96      Prescription Details   Frequency (times per week) 3    Duration Progress to 30 minutes of continuous aerobic without signs/symptoms of physical distress      Intensity   THRR 40-80% of Max Heartrate 103-129    Ratings of Perceived Exertion 11-13    Perceived Dyspnea 0-4      Progression   Progression Continue to progress workloads to maintain intensity without signs/symptoms of physical distress.      Resistance Training   Training Prescription Yes    Weight 3 lb    Reps 10-15          Discharge Exercise Prescription (Final Exercise Prescription Changes):  Exercise Prescription Changes - 01/11/24 1400       Response to Exercise   Blood Pressure (Admit) 92/54    Blood Pressure (Exit) 96/50    Heart Rate (Admit) 90 bpm    Heart Rate (Exercise) 103 bpm    Heart Rate (Exit) 97 bpm    Oxygen Saturation (Admit) 95 %    Oxygen Saturation (Exercise) 98 %    Oxygen Saturation (Exit) 95 %    Rating of Perceived Exertion (Exercise) 14     Perceived Dyspnea (Exercise) 0    Symptoms none    Duration Progress to 30 minutes of  aerobic without signs/symptoms of physical distress    Intensity THRR unchanged      Progression   Progression Continue to progress workloads to maintain intensity without signs/symptoms of physical distress.    Average METs 2.82      Resistance Training   Training Prescription Yes    Weight 3 lb    Reps 10-15      Interval Training   Interval Training No      Treadmill   MPH 2    Grade 0    Minutes 15    METs 2.53      NuStep   Level 1   T6   Minutes 15    METs 3.1      Home Exercise Plan   Plans to continue exercise at Home (comment)   TM   Frequency Add 2 additional days to  program exercise sessions.    Initial Home Exercises Provided 11/15/23      Oxygen   Maintain Oxygen Saturation 88% or higher          Functional Capacity:  6 Minute Walk     Row Name 09/30/23 1018 01/14/24 1122       6 Minute Walk   Phase Initial Discharge    Distance 1020 feet 1170 feet    Distance % Change -- 14.7 %    Distance Feet Change -- 150 ft    Walk Time 6 minutes 6 minutes    # of Rest Breaks 0 0    MPH 1.93 2.2    METS 1.96 2.24    RPE 12 13    Perceived Dyspnea  1 0    VO2 Peak 6.87 7.8    Symptoms No No    Resting HR 78 bpm 93 bpm    Resting BP 114/68 118/64    Resting Oxygen Saturation  97 % 98 %    Exercise Oxygen Saturation  during 6 min walk 93 % 96 %    Max Ex. HR 101 bpm 115 bpm    Max Ex. BP 134/82 114/78    2 Minute Post BP 128/78 108/72      Interval HR   1 Minute HR 93 115    2 Minute HR 94 92    3 Minute HR 97 103    4 Minute HR 97 109    5 Minute HR 97 109    6 Minute HR 101 103    2 Minute Post HR 86 97    Interval Heart Rate? Yes Yes      Interval Oxygen   Interval Oxygen? Yes Yes    Baseline Oxygen Saturation % 97 % 98 %    1 Minute Oxygen Saturation % 93 % 98 %    1 Minute Liters of Oxygen 0 L  RA 0 L    2 Minute Oxygen Saturation % 96 % 96 %     2 Minute Liters of Oxygen 0 L 0 L    3 Minute Oxygen Saturation % 96 % 96 %    3 Minute Liters of Oxygen 0 L 0 L    4 Minute Oxygen Saturation % 96 % 96 %    4 Minute Liters of Oxygen 0 L 0 L    5 Minute Oxygen Saturation % 93 % 96 %    5 Minute Liters of Oxygen 0 L 0 L    6 Minute Oxygen Saturation % 96 % 97 %    6 Minute Liters of Oxygen 0 L 0 L    2 Minute Post Oxygen Saturation % 97 % 97 %    2 Minute Post Liters of Oxygen 0 L 0 L      Nutrition & Weight - Outcomes:  Pre Biometrics - 09/30/23 1021       Pre Biometrics   Height 5' 9.5 (1.765 m)    Weight 198 lb 1.6 oz (89.9 kg)    Waist Circumference 39 inches    Hip Circumference 40 inches    Waist to Hip Ratio 0.98 %    BMI (Calculated) 28.84    Single Leg Stand 10.13 seconds          Post Biometrics - 01/14/24 1126        Post  Biometrics   Height 5' 9.5 (1.765 m)    Weight 192 lb 11.2  oz (87.4 kg)    Waist Circumference 38.5 inches    Hip Circumference 39 inches    Waist to Hip Ratio 0.99 %    BMI (Calculated) 28.06    Single Leg Stand 30 seconds         Goals reviewed with patient; copy given to patient.

## 2024-01-14 NOTE — Progress Notes (Signed)
 Daily Session Note  Patient Details  Name: Chase Boyd MRN: [id number] Date of Birth: 1945/10/02 Referring Provider:   Flowsheet Row Pulmonary Rehab from 09/30/2023 in [facility] Cardiac and Pulmonary Rehab  Referring Provider Dr. Prentice Flatten, MD    Encounter Date: 01/14/2024  Check In:  Session Check In - 01/14/24 1059       Check-In   Supervising physician immediately available to respond to emergencies See telemetry face sheet for immediately available ER MD    Location [facility]-Cardiac & Pulmonary Rehab    Staff Present Burnard Davenport RN,BSN,MPA;[facility] RCP,RRT,BSRT;Maxon Conetta BS, Exercise Physiologist;Laura Cates RN,BSN    Virtual Visit No    Medication changes reported     No    Fall or balance concerns reported    No    Tobacco Cessation No Change    Warm-up and Cool-down Performed on first and last piece of equipment    Resistance Training Performed Yes    VAD Patient? No    PAD/SET Patient? No      Pain Assessment   Currently in Pain? No/denies             Social History   Tobacco Use  Smoking Status Never  Smokeless Tobacco Never    Goals Met:  Proper associated with RPD/PD & O2 Sat Independence with exercise equipment Using PLB without cueing & demonstrates good technique Exercise tolerated well No report of concerns or symptoms today Strength training completed today  Goals Unmet:  Not Applicable  Comments: Pt able to follow exercise prescription today without complaint.  Will continue to monitor for progression.   6 Minute Walk     Row Name 09/30/23 1018 01/14/24 1122       6 Minute Walk   Phase Initial Discharge    Distance 1020 feet 1170 feet    Distance % Change -- 14.7 %    Distance Feet Change -- 150 ft    Walk Time 6 minutes 6 minutes    # of Rest Breaks 0 0    MPH 1.93 2.2    METS 1.96 2.24    RPE 12 13    Perceived Dyspnea  1 0    VO2 Peak 6.87 7.8    Symptoms No No    Resting HR 78 bpm 93 bpm    Resting BP 114/68  118/64    Resting Oxygen Saturation  97 % 98 %    Exercise Oxygen Saturation  during 6 min walk 93 % 96 %    Max Ex. HR 101 bpm 115 bpm    Max Ex. BP 134/82 114/78    2 Minute Post BP 128/78 108/72      Interval HR   1 Minute HR 93 115    2 Minute HR 94 92    3 Minute HR 97 103    4 Minute HR 97 109    5 Minute HR 97 109    6 Minute HR 101 103    2 Minute Post HR 86 97    Interval Heart Rate? Yes Yes      Interval Oxygen   Interval Oxygen? Yes Yes    Baseline Oxygen Saturation % 97 % 98 %    1 Minute Oxygen Saturation % 93 % 98 %    1 Minute Liters of Oxygen 0 L  RA 0 L    2 Minute Oxygen Saturation % 96 % 96 %    2 Minute Liters of Oxygen  0 L 0 L    3 Minute Oxygen Saturation % 96 % 96 %    3 Minute Liters of Oxygen 0 L 0 L    4 Minute Oxygen Saturation % 96 % 96 %    4 Minute Liters of Oxygen 0 L 0 L    5 Minute Oxygen Saturation % 93 % 96 %    5 Minute Liters of Oxygen 0 L 0 L    6 Minute Oxygen Saturation % 96 % 97 %    6 Minute Liters of Oxygen 0 L 0 L    2 Minute Post Oxygen Saturation % 97 % 97 %    2 Minute Post Liters of Oxygen 0 L 0 L        Dr. Oneil Pinal is Medical Director for [facility] Cardiac Rehabilitation.  Dr. Fuad Aleskerov is Medical Director for [facility] Pulmonary Rehabilitation.

## 2024-01-18 ENCOUNTER — Encounter

## 2024-01-18 ENCOUNTER — Ambulatory Visit

## 2024-01-19 ENCOUNTER — Ambulatory Visit

## 2024-01-21 ENCOUNTER — Encounter

## 2024-01-21 ENCOUNTER — Encounter: Payer: Self-pay | Admitting: Emergency Medicine

## 2024-01-21 ENCOUNTER — Ambulatory Visit: Admitting: Physical Therapy

## 2024-01-21 DIAGNOSIS — I5042 Chronic combined systolic (congestive) and diastolic (congestive) heart failure: Secondary | ICD-10-CM

## 2024-01-21 NOTE — Progress Notes (Unsigned)
 Pulmonary Individual Treatment Plan  Patient Details  Name: Chase Boyd MRN: [id number] Date of Birth: 08/07/1945 Referring Provider:   Flowsheet Row Pulmonary Rehab from 09/30/2023 in [facility] Card[facility] Pulmonary Rehab  Referring Provider Dr. Prentice Flatten, MD    Initial Encounter Date:  Flowsheet Row Pulmonary Rehab from 09/30/2023 in [facility] Cardiac and Pulmonary Rehab  Date 09/30/23    Visit Diagnosis: Heart failure, systolic and diastolic, chronic (HCC)  Patient's Home Medications on Admission: Current Medications[1]  Past Medical History: No past medical history on file.  Tobacco Use: Tobacco Use History[2]  Labs: Review Flowsheet        No data to display           Pulmonary Assessment Scores:  Pulmonary Assessment Scores     Row Name 09/30/23 0959 01/14/24 1127       ADL UCSD   ADL Phase Entry --    SOB Score total 31 --    Rest 1 --    Walk 1 --    Stairs 2 --    Bath 0 --    Dress 0 --    Shop 1 --      CAT Score   CAT Score 14 --      mMRC Score   mMRC Score 2 2       UCSD: Self-administered rating of dyspnea associated with activities of daily living (ADLs) 6-point scale (0 = not at all to 5 = maximal or unable to do because of breathlessness)  Scoring Scores range from 0 to 120.  Minimally important difference is 5 units  CAT: CAT can identify the health impairment of COPD patients and is better correlated with disease progression.  CAT has a scoring range of zero to 40. The CAT score is classified into four groups of low (less than 10), medium (10 - 20), high (21-30) and very high (31-40) based on the impact level of disease on health status. A CAT score over 10 suggests significant symptoms.  A worsening CAT score could be explained by an exacerbation, poor medication adherence, poor inhaler technique, or progression of COPD or comorbid conditions.  CAT MCID is 2 points  mMRC: mMRC (Modified Medical Research Council) Dyspnea  Scale is used to assess the degree of baseline functional disability in patients of respiratory disease due to dyspnea. No minimal important difference is established. A decrease in score of 1 point or greater is considered a positive change.   Pulmonary Function Assessment:   Exercise Target Goals: Exercise Program Goal: Individual exercise prescription set using results from initial 6 min walk test and THRR while considering  patients activity barriers and safety.   Exercise Prescription Goal: Initial exercise prescription builds to 30-45 minutes a day of aerobic activity, 2-3 days per week.  Home exercise guidelines will be given to patient during program as part of exercise prescription that the participant will acknowledge.  Education: Aerobic Exercise: - Group verbal and visual presentation on the components of exercise prescription. Introduces F.I.T.T principle from ACSM for exercise prescriptions.  Reviews F.I.T.T. principles of aerobic exercise including progression. Written material provided at class time.   Education: Resistance Exercise: - Group verbal and visual presentation on the components of exercise prescription. Introduces F.I.T.T principle from ACSM for exercise prescriptions  Reviews F.I.T.T. principles of resistance exercise including progression. Written material provided at class time.    Education: Exercise & Equipment Safety: - Individual verbal instruction and demonstration of equipment use and safety with use  of the equipment. Flowsheet Row Pulmonary Rehab from 09/30/2023 in [facility] Cardiac and Pulmonary Rehab  Date 09/29/23  Educator jh  Instruction Review Code 1- Verbalizes Understanding    Education: Exercise Physiology & General Exercise Guidelines: - Group verbal and written instruction with models to review the exercise physiology of the cardiovascular system and associated critical values. Provides general exercise guidelines with specific guidelines to  those with heart or lung disease.    Education: Flexibility, Balance, Mind/Body Relaxation: - Group verbal and visual presentation with interactive activity on the components of exercise prescription. Introduces F.I.T.T principle from ACSM for exercise prescriptions. Reviews F.I.T.T. principles of flexibility and balance exercise training including progression. Also discusses the mind body connection.  Reviews various relaxation techniques to help reduce and manage stress (i.e. Deep breathing, progressive muscle relaxation, and visualization). Balance handout provided to take home. Written material provided at class time.   Activity Barriers & Risk Stratification:  Activity Barriers & Cardiac Risk Stratification - 09/30/23 1020       Activity Barriers & Cardiac Risk Stratification   Activity Barriers Joint Problems;Deconditioning;Balance Concerns;Muscular Weakness          6 Minute Walk:  6 Minute Walk     Row Name 09/30/23 1018 01/14/24 1122       6 Minute Walk   Phase Initial Discharge    Distance 1020 feet 1170 feet    Distance % Change -- 14.7 %    Distance Feet Change -- 150 ft    Walk Time 6 minutes 6 minutes    # of Rest Breaks 0 0    MPH 1.93 2.2    METS 1.96 2.24    RPE 12 13    Perceived Dyspnea  1 0    VO2 Peak 6.87 7.8    Symptoms No No    Resting HR 78 bpm 93 bpm    Resting BP 114/68 118/64    Resting Oxygen Saturation  97 % 98 %    Exercise Oxygen Saturation  during 6 min walk 93 % 96 %    Max Ex. HR 101 bpm 115 bpm    Max Ex. BP 134/82 114/78    2 Minute Post BP 128/78 108/72      Interval HR   1 Minute HR 93 115    2 Minute HR 94 92    3 Minute HR 97 103    4 Minute HR 97 109    5 Minute HR 97 109    6 Minute HR 101 103    2 Minute Post HR 86 97    Interval Heart Rate? Yes Yes      Interval Oxygen   Interval Oxygen? Yes Yes    Baseline Oxygen Saturation % 97 % 98 %    1 Minute Oxygen Saturation % 93 % 98 %    1 Minute Liters of Oxygen 0 L  RA  0 L    2 Minute Oxygen Saturation % 96 % 96 %    2 Minute Liters of Oxygen 0 L 0 L    3 Minute Oxygen Saturation % 96 % 96 %    3 Minute Liters of Oxygen 0 L 0 L    4 Minute Oxygen Saturation % 96 % 96 %    4 Minute Liters of Oxygen 0 L 0 L    5 Minute Oxygen Saturation % 93 % 96 %    5 Minute Liters of Oxygen 0 L 0 L  6 Minute Oxygen Saturation % 96 % 97 %    6 Minute Liters of Oxygen 0 L 0 L    2 Minute Post Oxygen Saturation % 97 % 97 %    2 Minute Post Liters of Oxygen 0 L 0 L      Oxygen Initial Assessment:   Oxygen Re-Evaluation:  Oxygen Re-Evaluation     Row Name 10/06/23 1620 11/01/23 1100 11/15/23 1142         Program Oxygen Prescription   Program Oxygen Prescription -- None None       Home Oxygen   Home Oxygen Device -- None None     Sleep Oxygen Prescription -- None --     Home Exercise Oxygen Prescription -- None None     Home Resting Oxygen Prescription -- None None       Goals/Expected Outcomes   Short Term Goals -- To learn and demonstrate proper pursed lip breathing techniques or other breathing techniques.  To learn and understand importance of maintaining oxygen saturations>88%;To learn and understand importance of monitoring SPO2 with pulse oximeter and demonstrate accurate use of the pulse oximeter.     Long  Term Goals -- Exhibits proper breathing techniques, such as pursed lip breathing or other method taught during program session Verbalizes importance of monitoring SPO2 with pulse oximeter and return demonstration;Maintenance of O2 saturations>88%     Comments Reviewed PLB technique with pt.  Talked about how it works and it's importance in maintaining their exercise saturations. Informed patient how to perform the Pursed Lipped breathing technique. Told patient to Inhale through the nose and out the mouth with pursed lips to keep their airways open, help oxygenate them better, practice when at rest or doing strenuous activity. Patient Verbalizes  understanding of technique and will work on and be reiterated during [facility]. Toddy reports that he does not have a pulse ox at home. Home exercise was reviewed with patient today and how to monitor HR and Sa O2 during home exericse was discussed. He was encouraged to purchase a pulse ox so that he can monitor these vitals at home during daily activities and exercise.     Goals/Expected Outcomes Short: Become more profiecient at using PLB.   Long: Become independent at using PLB. Short: use PLB with exertion. Long: use PLB on exertion proficiently and independently. Short: buy a pulse ox and start monitoring at home. Long: independently monitor SaO2 levels at home.        Oxygen Discharge (Final Oxygen Re-Evaluation):  Oxygen Re-Evaluation - 11/15/23 1142       Program Oxygen Prescription   Program Oxygen Prescription None      Home Oxygen   Home Oxygen Device None    Home Exercise Oxygen Prescription None    Home Resting Oxygen Prescription None      Goals/Expected Outcomes   Short Term Goals To learn and understand importance of maintaining oxygen saturations>88%;To learn and understand importance of monitoring SPO2 with pulse oximeter and demonstrate accurate use of the pulse oximeter.    Long  Term Goals Verbalizes importance of monitoring SPO2 with pulse oximeter and return demonstration;Maintenance of O2 saturations>88%    Comments Jabreel reports that he does not have a pulse ox at home. Home exercise was reviewed with patient today and how to monitor HR and Sa O2 during home exericse was discussed. He was encouraged to purchase a pulse ox so that he can monitor these vitals at home during daily activities and exercise.  Goals/Expected Outcomes Short: buy a pulse ox and start monitoring at home. Long: independently monitor SaO2 levels at home.          Initial Exercise Prescription:  Initial Exercise Prescription - 09/30/23 1000       Date of Initial Exercise RX and  Referring Provider   Date 09/30/23    Referring Provider Dr. Prentice Flatten, MD      Oxygen   Maintain Oxygen Saturation 88% or higher      Treadmill   MPH 1.8    Grade 0    Minutes 15    METs 2.38      Recumbant Bike   Level 2    RPM 50    Watts 15    Minutes 15    METs 1.96      NuStep   Level 2    SPM 80    Minutes 15    METs 1.96      T5 Nustep   Level 1    SPM 80    Minutes 15    METs 1.96      Prescription Details   Frequency (times per week) 3    Duration Progress to 30 minutes of continuous aerobic without signs/symptoms of physical distress      Intensity   THRR 40-80% of Max Heartrate 103-129    Ratings of Perceived Exertion 11-13    Perceived Dyspnea 0-4      Progression   Progression Continue to progress workloads to maintain intensity without signs/symptoms of physical distress.      Resistance Training   Training Prescription Yes    Weight 3 lb    Reps 10-15          Perform Capillary Blood Glucose checks as needed.  Exercise Prescription Changes:   Exercise Prescription Changes     Row Name 09/30/23 1000 10/19/23 0800 11/04/23 1000 11/15/23 1100 11/18/23 1600     Response to Exercise   Blood Pressure (Admit) 114/68 104/68 110/76 -- 112/66   Blood Pressure (Exercise) 134/82 126/60 142/88 -- 122/72   Blood Pressure (Exit) 128/78 112/68 110/80 -- 110/60   Heart Rate (Admit) 78 bpm 95 bpm 100 bpm -- 86 bpm   Heart Rate (Exercise) 101 bpm 114 bpm 126 bpm -- 108 bpm   Heart Rate (Exit) 86 bpm 90 bpm 95 bpm -- 100 bpm   Oxygen Saturation (Admit) 97 % 96 % 97 % -- 97 %   Oxygen Saturation (Exercise) 93 % 94 % 94 % -- 94 %   Oxygen Saturation (Exit) 97 % 97 % 98 % -- 98 %   Rating of Perceived Exertion (Exercise) 12 13 17  -- 15   Perceived Dyspnea (Exercise) 1 1 2  -- 1   Symptoms none none none -- none   Comments Results 1st 2 weeks of exercise sessions -- -- --   Duration -- Progress to 30 minutes of  aerobic without signs/symptoms  of physical distress Progress to 30 minutes of  aerobic without signs/symptoms of physical distress Progress to 30 minutes of  aerobic without signs/symptoms of physical distress Progress to 30 minutes of  aerobic without signs/symptoms of physical distress   Intensity -- THRR unchanged THRR unchanged THRR unchanged THRR unchanged     Progression   Progression -- Continue to progress workloads to maintain intensity without signs/symptoms of physical distress. Continue to progress workloads to maintain intensity without signs/symptoms of physical distress. Continue to progress workloads to maintain intensity  without signs/symptoms of physical distress. Continue to progress workloads to maintain intensity without signs/symptoms of physical distress.   Average METs -- 2.16 2.52 2.52 2.64     Resistance Training   Training Prescription -- Yes Yes Yes Yes   Weight -- 3 lb 3 lb 3 lb 3 lb   Reps -- 10-15 10-15 10-15 10-15     Interval Training   Interval Training -- No No No No     Treadmill   MPH -- 2 2.5 2.5 2.5   Grade -- 0 1 1 0   Minutes -- 15 15 15 15    METs -- 2.53 3.26 3.26 2.91     Recumbant Bike   Level -- 2 10 10 7    Watts -- 15 15 15  39   Minutes -- 19 39 39 15   METs -- 2.54 3.39 3.39 3.38     NuStep   Level -- 4 3 3 3    Minutes -- 15 15 15 15    METs -- 2.3 2.1 2.1 2.6     Arm Ergometer   Level -- 1 1 1  1.2   Minutes -- 15 15 15 15    METs -- 1 1 1 1      Elliptical   Level -- -- 1 1 --   Speed -- -- 3 3 --   Minutes -- -- 15 15 --   METs -- -- 3 3 --     T5 Nustep   Level -- 1 2 2  --   Minutes -- 15 15 15  --   METs -- 1.9 1.9 1.9 --     Biostep-RELP   Level -- -- 1 1 1    SPM -- -- 50 50 --   Minutes -- -- 15 15 15    METs -- -- 3 3 2      Home Exercise Plan   Plans to continue exercise at -- -- -- Home (comment)  TM Home (comment)  TM   Frequency -- -- -- Add 2 additional days to program exercise sessions. Add 2 additional days to program exercise sessions.    Initial Home Exercises Provided -- -- -- 11/15/23 11/15/23     Oxygen   Maintain Oxygen Saturation -- 88% or higher 88% or higher 88% or higher 88% or higher    Row Name 12/01/23 1100 12/16/23 1000 01/11/24 1400         Response to Exercise   Blood Pressure (Admit) 106/58 118/64 92/54     Blood Pressure (Exit) 94/60 102/58 96/50     Heart Rate (Admit) 93 bpm 89 bpm 90 bpm     Heart Rate (Exercise) 131 bpm 105 bpm 103 bpm     Heart Rate (Exit) 90 bpm 102 bpm 97 bpm     Oxygen Saturation (Admit) 97 % 97 % 95 %     Oxygen Saturation (Exercise) 94 % 95 % 98 %     Oxygen Saturation (Exit) 98 % 97 % 95 %     Rating of Perceived Exertion (Exercise) 15 14 14      Perceived Dyspnea (Exercise) 1 1 0     Symptoms none none none     Duration Progress to 30 minutes of  aerobic without signs/symptoms of physical distress Progress to 30 minutes of  aerobic without signs/symptoms of physical distress Progress to 30 minutes of  aerobic without signs/symptoms of physical distress     Intensity THRR unchanged THRR unchanged THRR unchanged  Progression   Progression Continue to progress workloads to maintain intensity without signs/symptoms of physical distress. Continue to progress workloads to maintain intensity without signs/symptoms of physical distress. Continue to progress workloads to maintain intensity without signs/symptoms of physical distress.     Average METs 2.5 2.5 2.82       Resistance Training   Training Prescription Yes Yes Yes     Weight 3 lb 3 lb 3 lb     Reps 10-15 10-15 10-15       Interval Training   Interval Training No No No       Treadmill   MPH 2.8 2.3 2     Grade 0 0.5 0     Minutes 15 15 15      METs 3.14 2.92 2.53       Recumbant Bike   Level 3.2 1.5 --     Watts 39 39 --     Minutes 15 15 --     METs 3.39 3.38 --       NuStep   Level 4 3 1   T6     Minutes 15 15 15      METs 2.6 2.2 3.1       Arm Ergometer   Level 1.5 -- --     Minutes 15 -- --      METs 1 -- --       T5 Nustep   Level -- 1 --     Minutes -- 15 --     METs -- 2.1 --       Biostep-RELP   Level 2 2 --     Minutes 15 15 --     METs 2 2 --       Home Exercise Plan   Plans to continue exercise at Home (comment)  TM Home (comment)  TM Home (comment)  TM     Frequency Add 2 additional days to program exercise sessions. Add 2 additional days to program exercise sessions. Add 2 additional days to program exercise sessions.     Initial Home Exercises Provided 11/15/23 11/15/23 11/15/23       Oxygen   Maintain Oxygen Saturation 88% or higher 88% or higher 88% or higher        Exercise Comments:   Exercise Comments     Row Name 10/06/23 1617           Exercise Comments First full day of exercise!  Patient was oriented to gym and equipment including functions, settings, policies, and procedures.  Patient's individual exercise prescription and treatment plan were reviewed.  All starting workloads were established based on the results of the 6 minute walk test done at initial orientation visit.  The plan for exercise progression was also introduced and progression will be customized based on patient's performance and goals.          Exercise Goals and Review:   Exercise Goals     Row Name 09/30/23 1020             Exercise Goals   Increase Physical Activity Yes       Intervention Provide advice, education, support and counseling about physical activity/exercise needs.;Develop an individualized exercise prescription for aerobic and resistive training based on initial evaluation findings, risk stratification, comorbidities and participant's personal goals.       Expected Outcomes Short Term: Attend rehab on a regular basis to increase amount of physical activity.;Long Term: Add in home exercise to make exercise part  of routine and to increase amount of physical activity.;Long Term: Exercising regularly at least 3-5 days a week.       Increase Strength and  Stamina Yes       Intervention Provide advice, education, support and counseling about physical activity/exercise needs.;Develop an individualized exercise prescription for aerobic and resistive training based on initial evaluation findings, risk stratification, comorbidities and participant's personal goals.       Expected Outcomes Short Term: Increase workloads from initial exercise prescription for resistance, speed, and METs.;Short Term: Perform resistance training exercises routinely during rehab and add in resistance training at home;Long Term: Improve cardiorespiratory fitness, muscular endurance and strength as measured by increased METs and functional capacity ( )       Able to understand and use rate of perceived exertion (RPE) scale Yes       Intervention Provide education and explanation on how to use RPE scale       Expected Outcomes Short Term: Able to use RPE daily in rehab to express subjective intensity level;Long Term:  Able to use RPE to guide intensity level when exercising independently       Able to understand and use Dyspnea scale Yes       Intervention Provide education and explanation on how to use Dyspnea scale       Expected Outcomes Short Term: Able to use Dyspnea scale daily in rehab to express subjective sense of shortness of breath during exertion;Long Term: Able to use Dyspnea scale to guide intensity level when exercising independently       Knowledge and understanding of Target Heart Rate Range (THRR) Yes       Intervention Provide education and explanation of THRR including how the numbers were predicted and where they are located for reference       Expected Outcomes Short Term: Able to state/look up THRR;Short Term: Able to use daily as guideline for intensity in rehab;Long Term: Able to use THRR to govern intensity when exercising independently       Able to check pulse independently Yes       Intervention Provide education and demonstration on how to check pulse  in carotid and radial arteries.;Review the importance of being able to check your own pulse for safety during independent exercise       Expected Outcomes Short Term: Able to explain why pulse checking is important during independent exercise;Long Term: Able to check pulse independently and accurately       Understanding of Exercise Prescription Yes       Intervention Provide education, explanation, and written materials on patient's individual exercise prescription       Expected Outcomes Short Term: Able to explain program exercise prescription;Long Term: Able to explain home exercise prescription to exercise independently          Exercise Goals Re-Evaluation :  Exercise Goals Re-Evaluation     Row Name 10/06/23 1617 10/19/23 0843 11/04/23 1054 11/15/23 1137 11/18/23 1653     Exercise Goal Re-Evaluation   Exercise Goals Review Increase Physical Activity;Able to understand and use rate of perceived exertion (RPE) scale;Knowledge and understanding of Target Heart Rate Range (THRR);Understanding of Exercise Prescription;Increase Strength and Stamina;Able to understand and use Dyspnea scale;Able to check pulse independently Increase Physical Activity;Understanding of Exercise Prescription;Increase Strength and Stamina Increase Physical Activity;Understanding of Exercise Prescription;Increase Strength and Stamina Increase Physical Activity;Increase Strength and Stamina;Able to understand and use rate of perceived exertion (RPE) scale;Able to understand and use Dyspnea scale;Knowledge and understanding of  Target Heart Rate Range (THRR);Able to check pulse independently;Understanding of Exercise Prescription;Improve claudication pain tolerance and improve walking ability Increase Physical Activity;Understanding of Exercise Prescription;Increase Strength and Stamina   Comments Reviewed RPE and dyspnea scale, THR and program prescription with pt today.  Pt voiced understanding and was given a copy of goals  to take home. Dyland is off to a good start in the program and completed his first 2 weeks of exercise sessions in this review. He had a workload on the treadmill at a speed of 2 mph with no incline. He worked at level 4 on the T4 nustep, level 1 on the T5 nustep, level 2 on the recumbent bike, and level 1 on the arm ergometer. We will continue to monitor his progress in the program. Mikeal is doing well in rehab. He increased his workload on the treadmill to a speed of 2. with 1% incline. He also increased to level 2 on the T5 nustep and level 10 on the recumbent bike. He tried the elliptical at level 1 and also the biostep at level 1. He maintained level 1 on the arm ergometer. We will continue to monitor his progress in the program. Reviewed home exercise with pt today from 11:24am to 11:34am.  Pt plans to use his TM at home for exercise.  Reviewed THR, pulse, RPE, sign and symptoms, pulse oximetery and when to call 911 or MD.  Also discussed weather considerations and indoor options.  Pt voiced understanding. Marsha is doing well in rehab. He maintained his speed on the treadmill at 2.5 mph. He also maintained level 3 on the T4 nustep. He increased to level 1.2 on the arm ergometer. He decreased slightly on the recumbent bike to level 7. We will continue to monitor his progress in the program.   Expected Outcomes Short: Use RPE daily to regulate intensity.  Long: Follow program prescription in THR. Short: Continue to follow current exercise prescription. Long: Continue exercise to improve strength and stamina. Short: Continue to progressively increase treadmill and nustep workloads. Long: Continue exercise to improve strength and stamina. Short: add 1-2 days a week of exercies at home on off days of rehab. Long: maintain independent exercise routine upon graduation from program. Short: Progressively increase recumbent bike workload back up when able. Long: Continue exercise to improve strength and  stamina.    Row Name 12/01/23 1134 12/16/23 1051 12/30/23 1040 01/11/24 1450       Exercise Goal Re-Evaluation   Exercise Goals Review Increase Physical Activity;Understanding of Exercise Prescription;Increase Strength and Stamina Increase Physical Activity;Understanding of Exercise Prescription;Increase Strength and Stamina Increase Physical Activity;Understanding of Exercise Prescription;Increase Strength and Stamina Increase Physical Activity;Understanding of Exercise Prescription;Increase Strength and Stamina    Comments Kimothy continues to do well in rehab. He has been to increase his speed on the treadmill from 2.5 to 2.29mph. He was also able to increase from level 3 to 4 on the T4 nustep. We will continue to monitor his progress in the program. Talton continues to do well in rehab. He is due for his post soon and hopes to improve. He increased his incline on the treadmill to 0.5% with a speed of 2.3 mph. He worked at level 2 on the biostep, level 1 on the T5 nustep, level 1.5 on the recumbent bike, and level 3 on the T4 nustep. We will continue to monitor his progress in the program. Petr has not attended rehab since the last review. We will contact him to see  when he plans to return to the program. We will continue to monitor his progress when he returns to the program. Carlin returned to rehab in this review period and attended 1 session He was able to walk on the treadmill at a speed of 2 mph with no incline. He worked at level 1 on the T6 nustep. He is due for his post in the next review. We will continue to monitor his progress in the program.    Expected Outcomes Short: Continue to progress workloads on the treadmill. Long: Continue exercise to improve strength and stamina. Short: Improve on post . Long: Continue to increase overall METs and stamina. Short: Return to rehab when appropriate. Long: Graduate. Short: Improve on post . Long: Continue to increase overall METs  and stamina.       Discharge Exercise Prescription (Final Exercise Prescription Changes):  Exercise Prescription Changes - 01/11/24 1400       Response to Exercise   Blood Pressure (Admit) 92/54    Blood Pressure (Exit) 96/50    Heart Rate (Admit) 90 bpm    Heart Rate (Exercise) 103 bpm    Heart Rate (Exit) 97 bpm    Oxygen Saturation (Admit) 95 %    Oxygen Saturation (Exercise) 98 %    Oxygen Saturation (Exit) 95 %    Rating of Perceived Exertion (Exercise) 14    Perceived Dyspnea (Exercise) 0    Symptoms none    Duration Progress to 30 minutes of  aerobic without signs/symptoms of physical distress    Intensity THRR unchanged      Progression   Progression Continue to progress workloads to maintain intensity without signs/symptoms of physical distress.    Average METs 2.82      Resistance Training   Training Prescription Yes    Weight 3 lb    Reps 10-15      Interval Training   Interval Training No      Treadmill   MPH 2    Grade 0    Minutes 15    METs 2.53      NuStep   Level 1   T6   Minutes 15    METs 3.1      Home Exercise Plan   Plans to continue exercise at Home (comment)   TM   Frequency Add 2 additional days to program exercise sessions.    Initial Home Exercises Provided 11/15/23      Oxygen   Maintain Oxygen Saturation 88% or higher          Nutrition:  Target Goals: Understanding of nutrition guidelines, daily intake of sodium 1500mg , cholesterol 200mg , calories 30% from fat and 7% or less from saturated fats, daily to have 5 or more servings of fruits and vegetables.  Education: Nutrition 1 -Group instruction provided by verbal, written material, interactive activities, discussions, models, and posters to present general guidelines for heart healthy nutrition including macronutrients, label reading, and promoting whole foods over processed counterparts. Education serves as pensions consultant of discussion of heart healthy eating for all. Written  material provided at class time.     Education: Nutrition 2 -Group instruction provided by verbal, written material, interactive activities, discussions, models, and posters to present general guidelines for heart healthy nutrition including sodium, cholesterol, and saturated fat. Providing guidance of habit forming to improve blood pressure, cholesterol, and body weight. Written material provided at class time.     Biometrics:  Pre Biometrics - 09/30/23 1021  Pre Biometrics   Height 5' 9.5 (1.765 m)    Weight 198 lb 1.6 oz (89.9 kg)    Waist Circumference 39 inches    Hip Circumference 40 inches    Waist to Hip Ratio 0.98 %    BMI (Calculated) 28.84    Single Leg Stand 10.13 seconds          Post Biometrics - 01/14/24 1126        Post  Biometrics   Height 5' 9.5 (1.765 m)    Weight 192 lb 11.2 oz (87.4 kg)    Waist Circumference 38.5 inches    Hip Circumference 39 inches    Waist to Hip Ratio 0.99 %    BMI (Calculated) 28.06    Single Leg Stand 30 seconds          Nutrition Therapy Plan and Nutrition Goals:  Nutrition Therapy & Goals - 09/30/23 1002       Intervention Plan   Intervention Prescribe, educate and counsel regarding individualized specific dietary modifications aiming towards targeted core components such as weight, hypertension, lipid management, diabetes, heart failure and other comorbidities.    Expected Outcomes Short Term Goal: Understand basic principles of dietary content, such as calories, fat, sodium, cholesterol and nutrients.;Short Term Goal: A plan has been developed with personal nutrition goals set during dietitian appointment.;Long Term Goal: Adherence to prescribed nutrition plan.          Nutrition Assessments:  MEDIFICTS Score Key: >=70 Need to make dietary changes  40-70 Heart Healthy Diet <= 40 Therapeutic Level Cholesterol Diet  Flowsheet Row Pulmonary Rehab from 10/08/2023 in [facility] Cardiac and Pulmonary Rehab   Picture Your Plate Total Score on Admission 45   Picture Your Plate Scores: <59 Unhealthy dietary pattern with much room for improvement. 41-50 Dietary pattern unlikely to meet recommendations for good health and room for improvement. 51-60 More healthful dietary pattern, with some room for improvement.  >60 Healthy dietary pattern, although there may be some specific behaviors that could be improved.   Nutrition Goals Re-Evaluation:  Nutrition Goals Re-Evaluation     Row Name 11/01/23 1101             Goals   Comment Patient was informed on why it is important to maintain a balanced diet when dealing with Respiratory issues. Explained that it takes a lot of energy to breath and when they are short of breath often they will need to have a good diet to help keep up with the calories they are expending for breathing.       Expected Outcome Short: Choose and plan snacks accordingly to patients caloric intake to improve breathing. Long: Maintain a diet independently that meets their caloric intake to aid in daily shortness of breath.          Nutrition Goals Discharge (Final Nutrition Goals Re-Evaluation):  Nutrition Goals Re-Evaluation - 11/01/23 1101       Goals   Comment Patient was informed on why it is important to maintain a balanced diet when dealing with Respiratory issues. Explained that it takes a lot of energy to breath and when they are short of breath often they will need to have a good diet to help keep up with the calories they are expending for breathing.    Expected Outcome Short: Choose and plan snacks accordingly to patients caloric intake to improve breathing. Long: Maintain a diet independently that meets their caloric intake to aid in daily shortness of breath.  Psychosocial: Target Goals: Acknowledge presence or absence of significant depression and/or stress, maximize coping skills, provide positive support system. Participant is able to verbalize  types and ability to use techniques and skills needed for reducing stress and depression.   Education: Stress, Anxiety, and Depression - Group verbal and visual presentation to define topics covered.  Reviews how body is impacted by stress, anxiety, and depression.  Also discusses healthy ways to reduce stress and to treat/manage anxiety and depression.  Written material provided at class time.   Education: Sleep Hygiene -Provides group verbal and written instruction about how sleep can affect your health.  Define sleep hygiene, discuss sleep cycles and impact of sleep habits. Review good sleep hygiene tips.    Initial Review & Psychosocial Screening:  Initial Psych Review & Screening - 09/29/23 1426       Initial Review   Current issues with Current Psychotropic Meds;History of Depression;Current Depression;Current Anxiety/Panic      Family Dynamics   Good Support System? Yes    Comments Lillard states that his depression is there and has a family history of depression. He can look to his spouse for support.      Barriers   Psychosocial barriers to participate in program There are no identifiable barriers or psychosocial needs.;The patient should benefit from training in stress management and relaxation.      Screening Interventions   Interventions To provide support and resources with identified psychosocial needs;Provide feedback about the scores to participant;Encouraged to exercise    Expected Outcomes Short Term goal: Utilizing psychosocial counselor, staff and physician to assist with identification of specific Stressors or current issues interfering with healing process. Setting desired goal for each stressor or current issue identified.;Long Term Goal: Stressors or current issues are controlled or eliminated.;Short Term goal: Identification and review with participant of any Quality of Life or Depression concerns found by scoring the questionnaire.;Long Term goal: The participant  improves quality of Life and PHQ9 Scores as seen by post scores and/or verbalization of changes          Quality of Life Scores:  Scores of 19 and below usually indicate a poorer quality of life in these areas.  A difference of  2-3 points is a clinically meaningful difference.  A difference of 2-3 points in the total score of the Quality of Life Index has been associated with significant improvement in overall quality of life, self-image, physical symptoms, and general health in studies assessing change in quality of life.  PHQ-9: Review Flowsheet  More data exists      11/01/2023 09/30/2023 09/06/2019 08/02/2019 07/26/2019  Depression screen PHQ 2/9  Decreased Interest 3 2 1 1 1   Down, Depressed, Hopeless 3 2 1  0 1  PHQ - 2 Score 6 4 2 1 2   Altered sleeping 2 2 0 0 0  Tired, decreased energy 3 2 1 1 1   Change in appetite 1 1 0 0 0  Feeling bad or failure about yourself  0 0 0 0 0  Trouble concentrating 2 2 0 0 0  Moving slowly or fidgety/restless 1 1 1  0 0  Suicidal thoughts 0 0 0 0 0  PHQ-9 Score 15  12  4  2  3    Difficult doing work/chores Not difficult at all Somewhat difficult Not difficult at all Not difficult at all Not difficult at all    Details       Data saved with a previous flowsheet row definition  Interpretation of Total Score  Total Score Depression Severity:  1-4 = Minimal depression, 5-9 = Mild depression, 10-14 = Moderate depression, 15-19 = Moderately severe depression, 20-27 = Severe depression   Psychosocial Evaluation and Intervention:  Psychosocial Evaluation - 09/29/23 1428       Psychosocial Evaluation & Interventions   Interventions Encouraged to exercise with the program and follow exercise prescription;Relaxation education;Stress management education    Comments Geroge states that his depression is there and has a family history of depression. He can look to his spouse for support.    Expected Outcomes Short: Start [facility] to help  with mood. Long: Maintain a healthy mental state    Continue Psychosocial Services  Follow up required by staff          Psychosocial Re-Evaluation:  Psychosocial Re-Evaluation     Row Name 11/01/23 1105             Psychosocial Re-Evaluation   Current issues with Current Depression;History of Depression;Current Psychotropic Meds;Current Stress Concerns       Comments Reviewed patient health questionnaire (PHQ-9) with patient for follow up. Previously, patients score indicated signs/symptoms of depression.  Reviewed to see if patient is improving symptom wise while in program.  Score declined and patient states that it is because he has chronic depression and a lack of energy.       Expected Outcomes Short: Continue to work toward an improvement in PHQ9 scores by attending [facility] regularly. Long: Continue to improve stress and depression coping skills by talking with staff and attending [facility] regularly and work toward a positive mental state.       Interventions Encouraged to attend Pulmonary Rehabilitation for the exercise       Continue Psychosocial Services  Follow up required by staff          Psychosocial Discharge (Final Psychosocial Re-Evaluation):  Psychosocial Re-Evaluation - 11/01/23 1105       Psychosocial Re-Evaluation   Current issues with Current Depression;History of Depression;Current Psychotropic Meds;Current Stress Concerns    Comments Reviewed patient health questionnaire (PHQ-9) with patient for follow up. Previously, patients score indicated signs/symptoms of depression.  Reviewed to see if patient is improving symptom wise while in program.  Score declined and patient states that it is because he has chronic depression and a lack of energy.    Expected Outcomes Short: Continue to work toward an improvement in PHQ9 scores by attending [facility] regularly. Long: Continue to improve stress and depression coping skills by talking with staff and attending  [facility] regularly and work toward a positive mental state.    Interventions Encouraged to attend Pulmonary Rehabilitation for the exercise    Continue Psychosocial Services  Follow up required by staff          Education: Education Goals: Education classes will be provided on a weekly basis, covering required topics. Participant will state understanding/return demonstration of topics presented.  Learning Barriers/Preferences:  Learning Barriers/Preferences - 09/29/23 1426       Learning Barriers/Preferences   Learning Barriers Hearing    Learning Preferences None          General Pulmonary Education Topics:  Infection Prevention: - Provides verbal and written material to individual with discussion of infection control including proper hand washing and proper equipment cleaning during exercise session. Flowsheet Row Pulmonary Rehab from 09/30/2023 in [facility] Cardiac and Pulmonary Rehab  Date 09/29/23  Educator jh  Instruction Review Code 1- Verbalizes Understanding    Falls Prevention: -  Provides verbal and written material to individual with discussion of falls prevention and safety. Flowsheet Row Pulmonary Rehab from 09/30/2023 in [facility] Cardiac and Pulmonary Rehab  Date 09/29/23  Educator jh  Instruction Review Code 1- Verbalizes Understanding    Chronic Lung Disease Review: - Group verbal instruction with posters, models, PowerPoint presentations and videos,  to review new updates, new respiratory medications, new advancements in procedures and treatments. Providing information on websites and 800 numbers for continued self-education. Includes information about supplement oxygen, available portable oxygen systems, continuous and intermittent flow rates, oxygen safety, concentrators, and Medicare reimbursement for oxygen. Explanation of Pulmonary Drugs, including class, frequency, complications, importance of spacers, rinsing mouth after steroid MDI's, and proper cleaning  methods for nebulizers. Review of basic lung anatomy and physiology related to function, structure, and complications of lung disease. Review of risk factors. Discussion about methods for diagnosing sleep apnea and types of masks and machines for OSA. Includes a review of the use of types of environmental controls: home humidity, furnaces, filters, dust mite/pet prevention, HEPA vacuums. Discussion about weather changes, air quality and the benefits of nasal washing. Instruction on Warning signs, infection symptoms, calling MD promptly, preventive modes, and value of vaccinations. Review of effective airway clearance, coughing and/or vibration techniques. Emphasizing that all should Create an Action Plan. Written material provided at class time. Flowsheet Row Cardiac Rehab from 08/30/2019 in [facility] Cardiac and Pulmonary Rehab  Date 08/30/19  Educator jh  Instruction Review Code 1- Verbalizes Understanding    AED/CPR: - Group verbal and written instruction with the use of models to demonstrate the basic use of the AED with the basic ABC's of resuscitation.    Tests and Procedures:  - Group verbal and visual presentation and models provide information about basic cardiac anatomy and function. Reviews the testing methods done to diagnose heart disease and the outcomes of the test results. Describes the treatment choices: Medical Management, Angioplasty, or Coronary Bypass Surgery for treating various heart conditions including Myocardial Infarction, Angina, Valve Disease, and Cardiac Arrhythmias.  Written material provided at class time.   Medication Safety: - Group verbal and visual instruction to review commonly prescribed medications for heart and lung disease. Reviews the medication, class of the drug, and side effects. Includes the steps to properly store meds and maintain the prescription regimen.  Written material given at graduation.   Other: -Provides group and verbal instruction on various  topics (see comments)   Knowledge Questionnaire Score:  Knowledge Questionnaire Score - 09/30/23 1001       Knowledge Questionnaire Score   Pre Score 14/18           Core Components/Risk Factors/Patient Goals at Admission:  Personal Goals and Risk Factors at Admission - 09/29/23 1425       Core Components/Risk Factors/Patient Goals on Admission    Weight Management Yes;Weight Loss    Intervention Weight Management: Develop a combined nutrition and exercise program designed to reach desired caloric intake, while maintaining appropriate intake of nutrient and fiber, sodium and fats, and appropriate energy expenditure required for the weight goal.;Weight Management: Provide education and appropriate resources to help participant work on and attain dietary goals.;Weight Management/Obesity: Establish reasonable short term and long term weight goals.    Expected Outcomes Short Term: Continue to assess and modify interventions until short term weight is achieved;Long Term: Adherence to nutrition and physical activity/exercise program aimed toward attainment of established weight goal;Weight Loss: Understanding of general recommendations for a balanced deficit meal plan, which  promotes 1-2 lb weight loss per week and includes a negative energy balance of [id number] kcal/d;Understanding recommendations for meals to include 15-35% energy as protein, 25-35% energy from fat, 35-60% energy from carbohydrates, less than 200mg  of dietary cholesterol, 20-35 gm of total fiber daily;Understanding of distribution of calorie intake throughout the day with the consumption of 4-5 meals/snacks    Improve shortness of breath with ADL's Yes    Intervention Provide education, individualized exercise plan and daily activity instruction to help decrease symptoms of SOB with activities of daily living.    Expected Outcomes Short Term: Improve cardiorespiratory fitness to achieve a reduction of symptoms when performing  ADLs;Long Term: Be able to perform more ADLs without symptoms or delay the onset of symptoms    Heart Failure Yes    Intervention Provide a combined exercise and nutrition program that is supplemented with education, support and counseling about heart failure. Directed toward relieving symptoms such as shortness of breath, decreased exercise tolerance, and extremity edema.    Expected Outcomes Improve functional capacity of life;Short term: Attendance in program 2-3 days a week with increased exercise capacity. Reported lower sodium intake. Reported increased fruit and vegetable intake. Reports medication compliance.;Short term: Daily weights obtained and reported for increase. Utilizing diuretic protocols set by physician.;Long term: Adoption of self-care skills and reduction of barriers for early signs and symptoms recognition and intervention leading to self-care maintenance.    Hypertension Yes    Intervention Provide education on lifestyle modifcations including regular physical activity/exercise, weight management, moderate sodium restriction and increased consumption of fresh fruit, vegetables, and low fat dairy, alcohol moderation, and smoking cessation.;Monitor prescription use compliance.    Expected Outcomes Short Term: Continued assessment and intervention until BP is < 140/34mm HG in hypertensive participants. < 130/59mm HG in hypertensive participants with diabetes, heart failure or chronic kidney disease.;Long Term: Maintenance of blood pressure at goal levels.    Lipids Yes    Intervention Provide education and support for participant on nutrition & aerobic/resistive exercise along with prescribed medications to achieve LDL 70mg , HDL >40mg .    Expected Outcomes Short Term: Participant states understanding of desired cholesterol values and is compliant with medications prescribed. Participant is following exercise prescription and nutrition guidelines.;Long Term: Cholesterol controlled with  medications as prescribed, with individualized exercise RX and with personalized nutrition plan. Value goals: LDL < 70mg , HDL > 40 mg.          Education:Diabetes - Individual verbal and written instruction to review signs/symptoms of diabetes, desired ranges of glucose level fasting, after meals and with exercise. Acknowledge that pre and post exercise glucose checks will be done for 3 sessions at entry of program.   Know Your Numbers and Heart Failure: - Group verbal and visual instruction to discuss disease risk factors for cardiac and pulmonary disease and treatment options.  Reviews associated critical values for Overweight/Obesity, Hypertension, Cholesterol, and Diabetes.  Discusses basics of heart failure: signs/symptoms and treatments.  Introduces Heart Failure Zone chart for action plan for heart failure. Written material provided at class time. Flowsheet Row Pulmonary Rehab from 09/30/2023 in [facility] Cardiac and Pulmonary Rehab  Education need identified 09/30/23    Core Components/Risk Factors/Patient Goals Review:    Core Components/Risk Factors/Patient Goals at Discharge (Final Review):    ITP Comments:  ITP Comments     Row Name 09/29/23 1431 09/30/23 0957 10/06/23 1054 10/06/23 1617 11/03/23 1127   ITP Comments Virtual Visit completed. Patient informed on EP and RD appointment and 6  Minute walk test. Patient also informed of patient health questionnaires on My Chart. Patient Verbalizes understanding. Visit diagnosis can be found in [facility] 06/03/2023. Completed and gym orientation for respiratory care services. Initial ITP created and sent for review to Dr. Faud Aleskerov, Medical Director. 30 Day review completed. Medical Director ITP review done; changes made as directed and signed approval by Medical Director. New to program. First full day of exercise!  Patient was oriented to gym and equipment including functions, settings, policies, and procedures.  Patient's individual  exercise prescription and treatment plan were reviewed.  All starting workloads were established based on the results of the 6 minute walk test done at initial orientation visit.  The plan for exercise progression was also introduced and progression will be customized based on patient's performance and goals. 30 Day review completed. Medical Director ITP review done; changes made as directed and signed approval by Medical Director. New to program.    Row Name 12/01/23 1112 12/29/23 1009 01/21/24 1006       ITP Comments 30 Day review completed. Medical Director ITP review done; changes made as directed and signed approval by Medical Director. 30 Day review completed. Medical Director ITP review done; changes made as directed and signed approval by Medical Director. Patient is being discharged at this time due to impending shoulder surgery with followup PT appointments. He completed 30 of 36 sessions. Education folder will be mailed to patient per his request.        Comments: Early Discharge ITP     [1]  Current Outpatient Medications:    amiodarone (PACERONE) 200 MG tablet, Take by mouth. (Patient not taking: Reported on 09/29/2023), Disp: , Rfl:    amLODipine (NORVASC) 5 MG tablet, Take by mouth. (Patient not taking: Reported on 09/29/2023), Disp: , Rfl:    amLODipine (NORVASC) 5 MG tablet, Take 5 mg by mouth daily., Disp: , Rfl:    apixaban (ELIQUIS) 5 MG TABS tablet, Take by mouth. (Patient not taking: Reported on 09/29/2023), Disp: , Rfl:    apixaban (ELIQUIS) 5 MG TABS tablet, Take 5 mg by mouth., Disp: , Rfl:    ARIPiprazole (ABILIFY) 10 MG tablet, Take 10 mg by mouth., Disp: , Rfl:    buPROPion (WELLBUTRIN XL) 150 MG 24 hr tablet, Take by mouth., Disp: , Rfl:    buPROPion (WELLBUTRIN XL) 300 MG 24 hr tablet, Take 300 mg by mouth daily., Disp: , Rfl:    colestipol (COLESTID) 1 g tablet, Take 2 g by mouth., Disp: , Rfl:    escitalopram (LEXAPRO) 20 MG tablet, Take by mouth. (Patient not  taking: Reported on 09/29/2023), Disp: , Rfl:    finasteride (PROSCAR) 5 MG tablet, Take 1 tablet PO daily (Patient not taking: Reported on 09/29/2023), Disp: , Rfl:    finasteride (PROSCAR) 5 MG tablet, Take 5 mg by mouth daily., Disp: , Rfl:    furosemide (LASIX) 20 MG tablet, Take by mouth., Disp: , Rfl:    metoprolol succinate (TOPROL-XL) 25 MG 24 hr tablet, Take 25 mg by mouth daily., Disp: , Rfl:    Multiple Vitamin (MULTI-VITAMIN) tablet, Take by mouth., Disp: , Rfl:    Multiple Vitamins-Minerals (ONE DAILY CALCIUM/IRON) TABS, Take by mouth. (Patient not taking: Reported on 09/29/2023), Disp: , Rfl:    PARoxetine (PAXIL) 30 MG tablet, Take 30 mg by mouth., Disp: , Rfl:    pravastatin (PRAVACHOL) 20 MG tablet, Take 20 mg by mouth., Disp: , Rfl:    tamsulosin (FLOMAX) 0.4  MG CAPS capsule, Take 0.4 mg by mouth., Disp: , Rfl:  [2]  Social History Tobacco Use  Smoking Status Never  Smokeless Tobacco Never

## 2024-01-21 NOTE — Progress Notes (Unsigned)
 Early Discharge Summary  Chase Boyd DOB: 09/04/1945  [name] is discharging early due to upcoming surgery with follow up PT appointments. He completed 30 of 36 sessions.    6 Minute Walk     Row Name 09/30/23 1018 01/14/24 1122       6 Minute Walk   Phase Initial Discharge    Distance 1020 feet 1170 feet    Distance % Change -- 14.7 %    Distance Feet Change -- 150 ft    Walk Time 6 minutes 6 minutes    # of Rest Breaks 0 0    MPH 1.93 2.2    METS 1.96 2.24    RPE 12 13    Perceived Dyspnea  1 0    VO2 Peak 6.87 7.8    Symptoms No No    Resting HR 78 bpm 93 bpm    Resting BP 114/68 118/64    Resting Oxygen Saturation  97 % 98 %    Exercise Oxygen Saturation  during 6 min walk 93 % 96 %    Max Ex. HR 101 bpm 115 bpm    Max Ex. BP 134/82 114/78    2 Minute Post BP 128/78 108/72      Interval HR   1 Minute HR 93 115    2 Minute HR 94 92    3 Minute HR 97 103    4 Minute HR 97 109    5 Minute HR 97 109    6 Minute HR 101 103    2 Minute Post HR 86 97    Interval Heart Rate? Yes Yes      Interval Oxygen   Interval Oxygen? Yes Yes    Baseline Oxygen Saturation % 97 % 98 %    1 Minute Oxygen Saturation % 93 % 98 %    1 Minute Liters of Oxygen 0 L  RA 0 L    2 Minute Oxygen Saturation % 96 % 96 %    2 Minute Liters of Oxygen 0 L 0 L    3 Minute Oxygen Saturation % 96 % 96 %    3 Minute Liters of Oxygen 0 L 0 L    4 Minute Oxygen Saturation % 96 % 96 %    4 Minute Liters of Oxygen 0 L 0 L    5 Minute Oxygen Saturation % 93 % 96 %    5 Minute Liters of Oxygen 0 L 0 L    6 Minute Oxygen Saturation % 96 % 97 %    6 Minute Liters of Oxygen 0 L 0 L    2 Minute Post Oxygen Saturation % 97 % 97 %    2 Minute Post Liters of Oxygen 0 L 0 L

## 2024-01-24 ENCOUNTER — Ambulatory Visit

## 2024-01-25 ENCOUNTER — Ambulatory Visit

## 2024-01-25 ENCOUNTER — Encounter

## 2024-01-27 ENCOUNTER — Other Ambulatory Visit: Payer: Self-pay | Admitting: Orthopedic Surgery

## 2024-01-28 ENCOUNTER — Encounter

## 2024-01-28 ENCOUNTER — Ambulatory Visit: Admitting: Physical Therapy

## 2024-01-31 ENCOUNTER — Ambulatory Visit

## 2024-02-01 ENCOUNTER — Ambulatory Visit: Admitting: Physical Therapy

## 2024-02-01 ENCOUNTER — Encounter

## 2024-02-07 ENCOUNTER — Other Ambulatory Visit: Payer: Self-pay

## 2024-02-07 ENCOUNTER — Encounter
Admission: RE | Admit: 2024-02-07 | Discharge: 2024-02-07 | Disposition: A | Discharge status: Home / Self Care | Source: Ambulatory Visit | Attending: Orthopedic Surgery | Admitting: Orthopedic Surgery

## 2024-02-07 DIAGNOSIS — I482 Chronic atrial fibrillation, unspecified: Secondary | ICD-10-CM

## 2024-02-07 DIAGNOSIS — Z01812 Encounter for preprocedural laboratory examination: Secondary | ICD-10-CM

## 2024-02-07 DIAGNOSIS — Z01810 Encounter for preprocedural cardiovascular examination: Secondary | ICD-10-CM

## 2024-02-07 DIAGNOSIS — M25512 Pain in left shoulder: Secondary | ICD-10-CM

## 2024-02-07 DIAGNOSIS — I5022 Chronic systolic (congestive) heart failure: Secondary | ICD-10-CM

## 2024-02-07 NOTE — Progress Notes (Signed)
 Cardiac Clearance faxed to Dr Prentice Flatten. Fax confirmation confirmed

## 2024-02-07 NOTE — Pre-Procedure Instructions (Signed)
 Will send for cardiac clearance, per Dr.Patel will need to hold eliquis for 3 days.

## 2024-02-07 NOTE — Patient Instructions (Addendum)
 Your procedure is scheduled on: 02/15/24 - Tuesday Report to the Registration Desk on the 1st floor of the [facility]. To find out your arrival time, please call [phone] between 1PM - 3PM on: 02/14/24 - Monday If your arrival time is 6:00 am, do not arrive before that time as the [facility] entrance doors do not open until 6:00 am.  REMEMBER: Instructions that are not followed completely may result in serious medical risk, up to and including death; or upon the discretion of your surgeon and anesthesiologist your surgery may need to be rescheduled.  Do not eat food after midnight the night before surgery.  No gum chewing or hard candies.  You may however, drink CLEAR liquids up to 2 hours before you are scheduled to arrive for your surgery. Do not drink anything within 2 hours of your scheduled arrival time.  Clear liquids include: - water  - apple juice without pulp - gatorade (not RED colors) - black coffee or tea (Do NOT add milk or creamers to the coffee or tea) Do NOT drink anything that is not on this list.  In addition, your doctor has ordered for you to drink the provided:  Ensure Pre-Surgery Clear Carbohydrate Drink  Drinking this carbohydrate drink up to two hours before surgery helps to reduce insulin resistance and improve patient outcomes. Please complete drinking 2 hours before scheduled arrival time.  One week prior to surgery: Stop Anti-inflammatories (NSAIDS) such as Advil, Aleve, Ibuprofen, Motrin, Naproxen, Naprosyn and Aspirin based products such as Excedrin, Goody's Powder, BC Powder. You may continue to take Tylenol and Tramadol if needed for pain up until the day of surgery.  Stop ANY OVER THE COUNTER supplements until after surgery - Multiple Vitamin   apixaban (ELIQUIS) -   ON THE DAY OF SURGERY ONLY TAKE THESE MEDICATIONS WITH SIPS OF WATER:  amLODipine (NORVASC)  ARIPiprazole (ABILIFY)  buPROPion (WELLBUTRIN XL)  finasteride (PROSCAR)   metoprolol  succinate  PARoxetine (PAXIL)  tamsulosin (FLOMAX)    No Alcohol for 24 hours before or after surgery.  No Smoking including e-cigarettes for 24 hours before surgery.  No chewable tobacco products for at least 6 hours before surgery.  No nicotine patches on the day of surgery.  Do not use any recreational drugs for at least a week (preferably 2 weeks) before your surgery.  Please be advised that the combination of cocaine and anesthesia may have negative outcomes, up to and including death. If you test positive for cocaine, your surgery will be cancelled.  On the morning of surgery brush your teeth with toothpaste and water, you may rinse your mouth with mouthwash if you wish. Do not swallow any toothpaste or mouthwash.  Use CHG Soap or wipes as directed on instruction sheet.  Do not wear jewelry, make-up, hairpins, clips or nail polish.  For welded (permanent) jewelry: bracelets, anklets, waist bands, etc.  Please have this removed prior to surgery.  If it is not removed, there is a chance that hospital personnel will need to cut it off on the day of surgery.  Do not wear lotions, powders, or perfumes.   Do not shave body hair from the neck down 48 hours before surgery.  Contact lenses, hearing aids and dentures may not be worn into surgery.  Do not bring valuables to the hospital. [facility] is not responsible for any missing/lost belongings or valuables.   Notify your doctor if there is any change in your medical condition (cold, fever, infection).  Wear comfortable clothing (specific to your surgery type) to the hospital.  After surgery, you can help prevent lung complications by doing breathing exercises.  Take deep breaths and cough every 1-2 hours. Your doctor may order a device called an Incentive Spirometer to help you take deep breaths.  If you are being admitted to the hospital overnight, leave your suitcase in the car. After surgery it may be brought  to your room.  In case of increased patient census, it may be necessary for you, the patient, to continue your postoperative care in the Same Day Surgery department.  If you are being discharged the day of surgery, you will not be allowed to drive home. You will need a responsible individual to drive you home and stay with you for 24 hours after surgery.   If you are taking public transportation, you will need to have a responsible individual with you.  Please call the Pre-admissions Testing Dept. at [phone] if you have any questions about these instructions.  Surgery Visitation Policy:  Patients having surgery or a procedure may have two visitors.  Children under the age of 73 must have an adult with them who is not the patient.  Inpatient Visitation:    Visiting hours are 7 a.m. to 8 p.m. Up to four visitors are allowed at one time in a patient room. The visitors may rotate out with other people during the day.  One visitor age 19 or older may stay with the patient overnight and must be in the room by 8 p.m.   Merchandiser, Retail to address health-related social needs:  [email or web address]                                                                                                            Preparing for Surgery with CHLORHEXIDINE GLUCONATE (CHG) Soap  Chlorhexidine Gluconate (CHG) Soap  o An antiseptic cleaner that kills germs and bonds with the skin to continue killing germs even after washing  o Used for showering the night before surgery and morning of surgery  Before surgery, you can play an important role by reducing the number of germs on your skin.  CHG (Chlorhexidine gluconate) soap is an antiseptic cleanser which kills germs and bonds with the skin to continue killing germs even after washing.  Please do not use if you have an allergy to CHG or antibacterial soaps. If your skin becomes reddened/irritated stop using the CHG.  1.  Shower the NIGHT BEFORE SURGERY with CHG soap.  2. If you choose to wash your hair, wash your hair first as usual with your normal shampoo.  3. After shampooing, rinse your hair and body thoroughly to remove the shampoo.  4. Use CHG as you would any other liquid soap. You can apply CHG directly to the skin and wash gently with a clean washcloth.  5. Apply the CHG soap to your body only from the neck down. Do not use on open wounds or open sores. Avoid contact with your eyes, ears, mouth, and genitals (  private parts). Wash face and genitals (private parts) with your normal soap.  6. Wash thoroughly, paying special attention to the area where your surgery will be performed.  7. Thoroughly rinse your body with warm water.  8. Do not shower/wash with your normal soap after using and rinsing off the CHG soap.  9. Do not use lotions, oils, etc., after showering with CHG.  10. Pat yourself dry with a clean towel.  11. Wear clean pajamas to bed the night before surgery.  12. Place clean sheets on your bed the night of your shower and do not sleep with pets.  13. Do not apply any deodorants/lotions/powders.  14. Please wear clean clothes to the hospital.  15. Remember to brush your teeth with your regular toothpaste.

## 2024-02-08 ENCOUNTER — Ambulatory Visit: Admitting: Physical Therapy

## 2024-02-08 ENCOUNTER — Emergency Department

## 2024-02-08 ENCOUNTER — Encounter
Admission: RE | Admit: 2024-02-08 | Discharge: 2024-02-08 | Disposition: A | Discharge status: Home / Self Care | Source: Ambulatory Visit | Attending: Orthopedic Surgery | Admitting: Orthopedic Surgery

## 2024-02-08 ENCOUNTER — Encounter

## 2024-02-08 ENCOUNTER — Other Ambulatory Visit

## 2024-02-08 ENCOUNTER — Other Ambulatory Visit: Payer: Self-pay

## 2024-02-08 ENCOUNTER — Emergency Department
Admission: EM | Admit: 2024-02-08 | Discharge: 2024-02-08 | Disposition: A | Discharge status: Home / Self Care | Source: Ambulatory Visit | Attending: Emergency Medicine | Admitting: Emergency Medicine

## 2024-02-08 DIAGNOSIS — Z01818 Encounter for other preprocedural examination: Secondary | ICD-10-CM | POA: Diagnosis present

## 2024-02-08 DIAGNOSIS — I482 Chronic atrial fibrillation, unspecified: Secondary | ICD-10-CM | POA: Insufficient documentation

## 2024-02-08 DIAGNOSIS — Z01810 Encounter for preprocedural cardiovascular examination: Secondary | ICD-10-CM

## 2024-02-08 DIAGNOSIS — Z7901 Long term (current) use of anticoagulants: Secondary | ICD-10-CM | POA: Insufficient documentation

## 2024-02-08 DIAGNOSIS — R000 Tachycardia, unspecified: Secondary | ICD-10-CM | POA: Diagnosis present

## 2024-02-08 DIAGNOSIS — Z01812 Encounter for preprocedural laboratory examination: Secondary | ICD-10-CM

## 2024-02-08 DIAGNOSIS — I4891 Unspecified atrial fibrillation: Secondary | ICD-10-CM | POA: Diagnosis not present

## 2024-02-08 DIAGNOSIS — I5022 Chronic systolic (congestive) heart failure: Secondary | ICD-10-CM | POA: Insufficient documentation

## 2024-02-08 LAB — CBC
HCT: 39.5 % (ref 39.0–52.0)
Hemoglobin: 13.9 g/dL (ref 13.0–17.0)
MCH: 33.8 pg (ref 26.0–34.0)
MCHC: 35.2 g/dL (ref 30.0–36.0)
MCV: 96.1 fL (ref 80.0–100.0)
Platelets: 194 K/uL (ref 150–400)
RBC: 4.11 MIL/uL — ABNORMAL LOW (ref 4.22–5.81)
RDW: 12.4 % (ref 11.5–15.5)
WBC: 7.9 K/uL (ref 4.0–10.5)
nRBC: 0 % (ref 0.0–0.2)

## 2024-02-08 LAB — BASIC METABOLIC PANEL WITH GFR
Anion gap: 12 (ref 5–15)
BUN: 19 mg/dL (ref 8–23)
CO2: 24 mmol/L (ref 22–32)
Calcium: 9.7 mg/dL (ref 8.9–10.3)
Chloride: 104 mmol/L (ref 98–111)
Creatinine, Ser: 0.96 mg/dL (ref 0.61–1.24)
GFR, Estimated: >60 mL/min
Glucose, Bld: 106 mg/dL — ABNORMAL HIGH (ref 70–99)
Potassium: 3.5 mmol/L (ref 3.5–5.1)
Sodium: 139 mmol/L (ref 135–145)

## 2024-02-08 LAB — TROPONIN T, HIGH SENSITIVITY: Troponin T High Sensitivity: 19 ng/L (ref 0–19)

## 2024-02-08 MED ORDER — METOPROLOL SUCCINATE ER 50 MG PO TB24: 50.0000 mg | ORAL_TABLET | Freq: Every day | ORAL | 1 refills | Status: AC

## 2024-02-08 MED ORDER — METOPROLOL SUCCINATE ER 25 MG PO TB24
25.0000 mg | ORAL_TABLET | Freq: Every day | ORAL | Status: DC
  Administered 2024-02-08: 25 mg via ORAL
  Filled 2024-02-08: qty 1

## 2024-02-08 MED ORDER — METOPROLOL TARTRATE 5 MG/5ML IV SOLN
5.0000 mg | Freq: Once | INTRAVENOUS | Status: AC
  Administered 2024-02-08: 5 mg via INTRAVENOUS
  Filled 2024-02-08: qty 5

## 2024-02-08 NOTE — ED Provider Notes (Signed)
 "  [facility] Provider Note    Event Date/Time   First MD Initiated Contact with Patient 02/08/24 1230     (approximate)   History   Tachycardia   HPI  Chase Boyd is a 78 y.o. male with a long history of atrial fibrillation on Eliquis who was sent from preop testing because of tachycardia.  Reviewed patient's cardiology notes, he went from 50 of metoprolol  to 25 metoprolol  over the last 6 months to try to limit side effects.  The patient feels well and has no complaint     Physical Exam   Triage Vital Signs: ED Triage Vitals  Encounter Vitals Group     BP 02/08/24 1158 (!) 135/99     Girls Systolic BP Percentile --      Girls Diastolic BP Percentile --      Boys Systolic BP Percentile --      Boys Diastolic BP Percentile --      Pulse Rate 02/08/24 1158 (!) 139     Resp 02/08/24 1158 18     Temp 02/08/24 1158 98 F (36.7 C)     Temp Source 02/08/24 1158 Oral     SpO2 02/08/24 1158 96 %     Weight 02/08/24 1159 88.5 kg (195 lb)     Height 02/08/24 1159 1.778 m (5' 10)     Head Circumference --      Peak Flow --      Pain Score 02/08/24 1158 8     Pain Loc --      Pain Education --      Exclude from Growth Chart --     Most recent vital signs: Vitals:   02/08/24 1330 02/08/24 1350  BP: (!) 135/96 (!) 135/96  Pulse: 90 99  Resp: 15   Temp:    SpO2: 96%      General: Awake, no distress.  CV:  Good peripheral perfusion.  Irregular rhythm Resp:  Normal effort.  Abd:  No distention.  Other:     ED Results / Procedures / Treatments   Labs (all labs ordered are listed, but only abnormal results are displayed) Labs Reviewed  BASIC METABOLIC PANEL WITH GFR - Abnormal; Notable for the following components:      Result Value   Glucose, Bld 106 (*)    All other components within normal limits  CBC - Abnormal; Notable for the following components:   RBC 4.11 (*)    All other components within normal limits  TROPONIN T,  HIGH SENSITIVITY  TROPONIN T, HIGH SENSITIVITY     EKG  ED ECG REPORT I, Lamar Price, the attending physician, personally viewed and interpreted this ECG.  Date: 02/08/2024  Rhythm: Atrial flutter QRS Axis: normal Intervals: Abnormal ST/T Wave abnormalities: normal Narrative Interpretation: A flutter with RVR    RADIOLOGY  Chest x-ray viewed and interpreted by me, no acute abnormality   PROCEDURES:  Critical Care performed:   Procedures   MEDICATIONS ORDERED IN ED: Medications  metoprolol  succinate (TOPROL -XL) 24 hr tablet 25 mg (25 mg Oral Given 02/08/24 1350)  metoprolol  tartrate (LOPRESSOR ) injection 5 mg (5 mg Intravenous Given 02/08/24 1319)     IMPRESSION / MDM / ASSESSMENT AND PLAN / ED COURSE  I reviewed the triage vital signs and the nursing notes. Patient's presentation is most consistent with severe exacerbation of chronic illness.  Lab work is reassuring, patient is asymptomatic.  Treated with IV Lopressor  with improvement in heart  rate, will raise his metoprolol  dosing to 50 mg once daily again, he will follow-up with his cardiologist, no indication for admission at this time        FINAL CLINICAL IMPRESSION(S) / ED DIAGNOSES   Final diagnoses:  Atrial fibrillation with RVR (HCC)     Rx / DC Orders   ED Discharge Orders          Ordered    metoprolol  succinate (TOPROL -XL) 50 MG 24 hr tablet  Daily        02/08/24 1403             Note:  This document was prepared using Dragon voice recognition software and may include unintentional dictation errors.   Arlander Charleston, MD 02/08/24 [phone]  "

## 2024-02-08 NOTE — Progress Notes (Signed)
 Pt came in to PAT for labs/EKG prior to upcoming shoulder surgery with Dr Tobie on 02-15-24. EKG shows atrial flutter @138 . Pt takes Metoprolol  25 mg at bedtime. Last dose was last night. Pt denies feeling sob, kightheaded, dizziness at this time. BP 123/99. Informed pt that he needs to go to the ED. Called Charge nurse in ED and informed her of this the patient was taken by [facility] by 2 PAT staff members along with pts wife

## 2024-02-08 NOTE — ED Triage Notes (Signed)
 Pt presents to the ED via POV from preop. Pt is supposed to get surgery on his left shoulder to fix his torn rotator cuff. Pt was there and they found his HR to be elevated. Pt has a hx of afib and a flutter. Pt states that his PCP recently decreased his metoprolol  from 50mg  to 25mg . Pt denies dizziness or chest pain.

## 2024-02-09 NOTE — Progress Notes (Signed)
 Called Dr Tedi office and talked with nurse [facility] regarding cardiac clearance. Pt was cleared by Dr Cesario on 01-20-24 but pt came in to PAT yesterday (02-08-24) for labs and EKG and pt was in A-flutter with RVR at 140. We sent pt to ED. Lopressor  IV was given and rate decreased and pt was sent home with instructions to increase Metoprolol  from 25 mg to 50 mg and to follow up with cardiology. I informed RN of this and informed her I was sending over another clearance to make sure pt is still ok to proceed with surgery on 1-6.  RN gave me updated fax # and this was sent to Dr Tedi office

## 2024-02-09 NOTE — Progress Notes (Signed)
 Patient called inquiring if he could have his surgery on the scheduled date. I called Dr. Anthony office and spoke with [facility] Dr. Anthony nurse and she stated she had not spoken with Dr. Tobie about the event that occurred 02/07/24 with the patient going to the emergency room directly from the [facility] office. Rosie stated she would speak with Dr. Tobie and the patient would be called directly regarding his surgery. This information was given to the patient and he verbalized understanding. The patient was given the number [phone] if he needed to contact Dr. Anthony office directly.

## 2024-02-11 ENCOUNTER — Ambulatory Visit

## 2024-02-11 ENCOUNTER — Encounter

## 2024-02-14 NOTE — Progress Notes (Signed)
 Called Dr Tedi office regarding updated clearance. Spoke with nurse Chase Boyd who sent high priority message with pts upcoming shoulder surgery tomorrow. Chase Boyd my office # and fax # to send clearance to.

## 2024-02-14 NOTE — Progress Notes (Addendum)
 Received updated cardiac clearance from Dr Cesario due to pt coming to PAT on 02-08-24 and EKG showing A-Flutter @140 . Informed Dr Cesario of this since pt was taken to ED and then dc'd home. Dr Cesario sent updated clearance stating pt is still cleared for procedure. Clearance on chart. 4 day hold of Eliquis. Pts last dose was on 02-10-24

## 2024-02-15 ENCOUNTER — Encounter: Admission: RE | Disposition: A | Payer: Self-pay | Source: Home / Self Care | Attending: Orthopedic Surgery

## 2024-02-15 ENCOUNTER — Ambulatory Visit

## 2024-02-15 ENCOUNTER — Ambulatory Visit
Admission: RE | Admit: 2024-02-15 | Discharge: 2024-02-15 | Disposition: A | Discharge status: Home / Self Care | Attending: Orthopedic Surgery | Admitting: Orthopedic Surgery

## 2024-02-15 ENCOUNTER — Encounter: Payer: Self-pay | Admitting: Orthopedic Surgery

## 2024-02-15 ENCOUNTER — Ambulatory Visit: Payer: Self-pay | Admitting: Urgent Care

## 2024-02-15 ENCOUNTER — Ambulatory Visit: Admitting: Certified Registered"

## 2024-02-15 ENCOUNTER — Encounter

## 2024-02-15 ENCOUNTER — Other Ambulatory Visit: Payer: Self-pay

## 2024-02-15 DIAGNOSIS — M75122 Complete rotator cuff tear or rupture of left shoulder, not specified as traumatic: Secondary | ICD-10-CM | POA: Diagnosis not present

## 2024-02-15 DIAGNOSIS — I129 Hypertensive chronic kidney disease with stage 1 through stage 4 chronic kidney disease, or unspecified chronic kidney disease: Secondary | ICD-10-CM | POA: Insufficient documentation

## 2024-02-15 DIAGNOSIS — M19012 Primary osteoarthritis, left shoulder: Secondary | ICD-10-CM | POA: Diagnosis present

## 2024-02-15 DIAGNOSIS — M25812 Other specified joint disorders, left shoulder: Secondary | ICD-10-CM | POA: Insufficient documentation

## 2024-02-15 DIAGNOSIS — N189 Chronic kidney disease, unspecified: Secondary | ICD-10-CM | POA: Insufficient documentation

## 2024-02-15 DIAGNOSIS — F419 Anxiety disorder, unspecified: Secondary | ICD-10-CM | POA: Diagnosis not present

## 2024-02-15 DIAGNOSIS — S46212A Strain of muscle, fascia and tendon of other parts of biceps, left arm, initial encounter: Secondary | ICD-10-CM | POA: Diagnosis not present

## 2024-02-15 DIAGNOSIS — F32A Depression, unspecified: Secondary | ICD-10-CM | POA: Insufficient documentation

## 2024-02-15 HISTORY — PX: SUBACROMIAL DECOMPRESSION: SHX5174

## 2024-02-15 HISTORY — PX: BICEPT TENODESIS: SHX5116

## 2024-02-15 HISTORY — PX: SHOULDER ARTHROSCOPY WITH OPEN ROTATOR CUFF REPAIR: SHX6092

## 2024-02-15 SURGERY — ARTHROSCOPY, SHOULDER WITH REPAIR, ROTATOR CUFF, OPEN
Anesthesia: General | Site: Shoulder | Laterality: Left

## 2024-02-15 MED ORDER — BUPIVACAINE HCL (PF) 0.5 % IJ SOLN
INTRAMUSCULAR | Status: AC
  Filled 2024-02-15: qty 10

## 2024-02-15 MED ORDER — FENTANYL CITRATE (PF) 100 MCG/2ML IJ SOLN: 25.0000 ug | INTRAMUSCULAR | Status: DC | PRN

## 2024-02-15 MED ORDER — LIDOCAINE HCL (PF) 1 % IJ SOLN
INTRAMUSCULAR | Status: DC | PRN
  Administered 2024-02-15: 3 mL via SUBCUTANEOUS

## 2024-02-15 MED ORDER — ONDANSETRON 4 MG PO TBDP: 4.0000 mg | ORAL_TABLET | Freq: Three times a day (TID) | ORAL | 0 refills | Status: AC | PRN

## 2024-02-15 MED ORDER — BUPIVACAINE LIPOSOME 1.3 % IJ SUSP
INTRAMUSCULAR | Status: AC
  Filled 2024-02-15: qty 20

## 2024-02-15 MED ORDER — ALBUMIN HUMAN 5 % IV SOLN
INTRAVENOUS | Status: AC
  Filled 2024-02-15: qty 250

## 2024-02-15 MED ORDER — ALBUMIN HUMAN 5 % IV SOLN: INTRAVENOUS | Status: DC | PRN

## 2024-02-15 MED ORDER — PHENYLEPHRINE HCL-NACL 20-0.9 MG/250ML-% IV SOLN
INTRAVENOUS | Status: AC
  Filled 2024-02-15: qty 250

## 2024-02-15 MED ORDER — LIDOCAINE HCL (PF) 1 % IJ SOLN
INTRAMUSCULAR | Status: AC
  Filled 2024-02-15: qty 5

## 2024-02-15 MED ORDER — LACTATED RINGERS IV SOLN: INTRAVENOUS | Status: DC

## 2024-02-15 MED ORDER — CHLORHEXIDINE GLUCONATE 0.12 % MT SOLN
OROMUCOSAL | Status: AC
  Filled 2024-02-15: qty 15

## 2024-02-15 MED ORDER — LIDOCAINE HCL (PF) 2 % IJ SOLN
INTRAMUSCULAR | Status: AC
  Filled 2024-02-15: qty 5

## 2024-02-15 MED ORDER — PROPOFOL 10 MG/ML IV BOLUS
INTRAVENOUS | Status: AC
  Filled 2024-02-15: qty 20

## 2024-02-15 MED ORDER — PHENYLEPHRINE 80 MCG/ML (10ML) SYRINGE FOR IV PUSH (FOR BLOOD PRESSURE SUPPORT)
PREFILLED_SYRINGE | INTRAVENOUS | Status: DC | PRN
  Administered 2024-02-15 (×3): 160 ug via INTRAVENOUS
  Administered 2024-02-15: 80 ug via INTRAVENOUS

## 2024-02-15 MED ORDER — FENTANYL CITRATE (PF) 50 MCG/ML IJ SOSY
50.0000 ug | PREFILLED_SYRINGE | Freq: Once | INTRAMUSCULAR | Status: AC
  Administered 2024-02-15: 50 ug via INTRAVENOUS

## 2024-02-15 MED ORDER — BUPIVACAINE HCL (PF) 0.5 % IJ SOLN
INTRAMUSCULAR | Status: DC | PRN
  Administered 2024-02-15: 10 mL via PERINEURAL

## 2024-02-15 MED ORDER — FENTANYL CITRATE (PF) 50 MCG/ML IJ SOSY
PREFILLED_SYRINGE | INTRAMUSCULAR | Status: AC
  Filled 2024-02-15: qty 1

## 2024-02-15 MED ORDER — MIDAZOLAM HCL (PF) 2 MG/2ML IJ SOLN
1.0000 mg | Freq: Once | INTRAMUSCULAR | Status: AC
  Administered 2024-02-15: 1 mg via INTRAVENOUS

## 2024-02-15 MED ORDER — ROCURONIUM BROMIDE 10 MG/ML (PF) SYRINGE
PREFILLED_SYRINGE | INTRAVENOUS | Status: AC
  Filled 2024-02-15: qty 10

## 2024-02-15 MED ORDER — CHLORHEXIDINE GLUCONATE 0.12 % MT SOLN
15.0000 mL | Freq: Once | OROMUCOSAL | Status: AC
  Administered 2024-02-15: 15 mL via OROMUCOSAL

## 2024-02-15 MED ORDER — ORAL CARE MOUTH RINSE: 15.0000 mL | Freq: Once | OROMUCOSAL | Status: AC

## 2024-02-15 MED ORDER — LACTATED RINGERS IR SOLN
Status: DC | PRN
  Administered 2024-02-15 (×26): 3000 mL

## 2024-02-15 MED ORDER — SUGAMMADEX SODIUM 200 MG/2ML IV SOLN
INTRAVENOUS | Status: DC | PRN
  Administered 2024-02-15: 170 mg via INTRAVENOUS

## 2024-02-15 MED ORDER — FENTANYL CITRATE (PF) 100 MCG/2ML IJ SOLN
INTRAMUSCULAR | Status: DC | PRN
  Administered 2024-02-15: 50 ug via INTRAVENOUS
  Administered 2024-02-15 (×2): 25 ug via INTRAVENOUS

## 2024-02-15 MED ORDER — EPHEDRINE 5 MG/ML INJ
INTRAVENOUS | Status: AC
  Filled 2024-02-15: qty 5

## 2024-02-15 MED ORDER — CEFAZOLIN SODIUM-DEXTROSE 2-4 GM/100ML-% IV SOLN
2.0000 g | INTRAVENOUS | Status: AC
  Administered 2024-02-15: 2 g via INTRAVENOUS

## 2024-02-15 MED ORDER — DROPERIDOL 2.5 MG/ML IJ SOLN: 0.6250 mg | Freq: Once | INTRAMUSCULAR | Status: DC | PRN

## 2024-02-15 MED ORDER — ONDANSETRON HCL 4 MG/2ML IJ SOLN
INTRAMUSCULAR | Status: AC
  Filled 2024-02-15: qty 2

## 2024-02-15 MED ORDER — ONDANSETRON HCL 4 MG/2ML IJ SOLN
INTRAMUSCULAR | Status: DC | PRN
  Administered 2024-02-15: 4 mg via INTRAVENOUS

## 2024-02-15 MED ORDER — FENTANYL CITRATE (PF) 100 MCG/2ML IJ SOLN
INTRAMUSCULAR | Status: AC
  Filled 2024-02-15: qty 2

## 2024-02-15 MED ORDER — OXYCODONE HCL 5 MG PO TABS: 5.0000 mg | ORAL_TABLET | ORAL | 0 refills | Status: AC | PRN

## 2024-02-15 MED ORDER — EPHEDRINE SULFATE-NACL 50-0.9 MG/10ML-% IV SOSY
PREFILLED_SYRINGE | INTRAVENOUS | Status: DC | PRN
  Administered 2024-02-15: 5 mg via INTRAVENOUS

## 2024-02-15 MED ORDER — CEFAZOLIN SODIUM-DEXTROSE 2-4 GM/100ML-% IV SOLN
INTRAVENOUS | Status: AC
  Filled 2024-02-15: qty 100

## 2024-02-15 MED ORDER — LIDOCAINE HCL (CARDIAC) PF 100 MG/5ML IV SOSY
PREFILLED_SYRINGE | INTRAVENOUS | Status: DC | PRN
  Administered 2024-02-15: 100 mg via INTRAVENOUS

## 2024-02-15 MED ORDER — PROPOFOL 10 MG/ML IV BOLUS
INTRAVENOUS | Status: DC | PRN
  Administered 2024-02-15: 110 mg via INTRAVENOUS

## 2024-02-15 MED ORDER — LACTATED RINGERS IV SOLN: INTRAVENOUS | Status: DC | PRN

## 2024-02-15 MED ORDER — MIDAZOLAM HCL 2 MG/2ML IJ SOLN
INTRAMUSCULAR | Status: AC
  Filled 2024-02-15: qty 2

## 2024-02-15 MED ORDER — PHENYLEPHRINE HCL-NACL 20-0.9 MG/250ML-% IV SOLN
INTRAVENOUS | Status: DC | PRN
  Administered 2024-02-15: 30 ug/min via INTRAVENOUS

## 2024-02-15 MED ORDER — ROCURONIUM BROMIDE 100 MG/10ML IV SOLN
INTRAVENOUS | Status: DC | PRN
  Administered 2024-02-15: 30 mg via INTRAVENOUS
  Administered 2024-02-15: 10 mg via INTRAVENOUS

## 2024-02-15 MED ORDER — BUPIVACAINE LIPOSOME 1.3 % IJ SUSP
INTRAMUSCULAR | Status: DC | PRN
  Administered 2024-02-15: 10 mL via PERINEURAL

## 2024-02-15 SURGICAL SUPPLY — 70 items
ADAPTER IRRIG TUBE 2 SPIKE SOL (ADAPTER) ×2 IMPLANT
ANCH SUT KNTLS 4.75 ALPHAVENT (Anchor) IMPLANT
ANCHOR 2.3 SP SGL 1.2 XBRAID (Anchor) IMPLANT
ANCHOR KTLS 1.8 MINI CO-B BLUE (Anchor) ×1 IMPLANT
ANCHOR PSHLK BCMP PEK 3.5X19.5 (Anchor) IMPLANT
ANCHOR QFIX KTLS 1.8 MINI BLUE (Anchor) IMPLANT
ANCHOR SUT FBRTK 2.6 SP1.7 TAP (Anchor) IMPLANT
ANCHOR SWIVELOCK SP KL 4.75 (Anchor) IMPLANT
BLADE SHAVER 4.5X7 STR FR (MISCELLANEOUS) ×1 IMPLANT
BLADE SW THK.38XMED LNG THN (BLADE) IMPLANT
BNDG ADH 2 X3.75 FABRIC TAN LF (GAUZE/BANDAGES/DRESSINGS) ×1 IMPLANT
BUR BR 5.5 WIDE MOUTH (BURR) ×1 IMPLANT
CANNULA PART THRD DISP 5.75X7 (CANNULA) IMPLANT
CANNULA PARTIAL THREAD 2X7 (CANNULA) ×1 IMPLANT
CANNULA PASSPORT 10X5 (MISCELLANEOUS) IMPLANT
CANNULA TWIST IN 8.25X7CM (CANNULA) IMPLANT
CANNULA TWIST IN 8.25X9CM (CANNULA) IMPLANT
CHLORAPREP W/TINT 26 (MISCELLANEOUS) ×1 IMPLANT
COOLER ICEMAN CLASSIC (MISCELLANEOUS) ×1 IMPLANT
COVER LIGHT HANDLE STERIS (MISCELLANEOUS) ×1 IMPLANT
DERMABOND ADVANCED .7 DNX12 (GAUZE/BANDAGES/DRESSINGS) IMPLANT
DEVICE SUCT BLK HOLE OR FLOOR (MISCELLANEOUS) IMPLANT
DRAPE INCISE IOBAN 66X45 STRL (DRAPES) ×1 IMPLANT
ELECTRODE REM PT RTRN 9FT ADLT (ELECTROSURGICAL) ×1 IMPLANT
FIBERSTITCH RC CVD (Anchor) IMPLANT
GAUZE SPONGE 4X4 12PLY STRL (GAUZE/BANDAGES/DRESSINGS) ×1 IMPLANT
GAUZE XEROFORM 1X8 LF (GAUZE/BANDAGES/DRESSINGS) ×1 IMPLANT
GLOVE BIOGEL PI IND STRL 8 (GLOVE) ×2 IMPLANT
GLOVE SURG SYN 8.0 PF PI (GLOVE) ×2 IMPLANT
GOWN SRG XL LONG LVL 3 NONREIN (GOWNS) ×2 IMPLANT
GOWN STRL REUS W/ TWL LRG LVL3 (GOWN DISPOSABLE) ×1 IMPLANT
GRAFT TISS 20X25 1 THK DERM (Tissue) IMPLANT
IV LR IRRIG 3000ML ARTHROMATIC (IV SOLUTION) ×4 IMPLANT
KIT STABILIZATION SHOULDER (MISCELLANEOUS) ×1 IMPLANT
KIT TURNOVER KIT A (KITS) ×1 IMPLANT
MANIFOLD NEPTUNE II (INSTRUMENTS) ×1 IMPLANT
MASK FACE SPIDER DISP (MASK) ×1 IMPLANT
MAT ABSORB FLUID 56X50 GRAY (MISCELLANEOUS) ×2 IMPLANT
NEEDLE HYPO 22X1.5 SAFETY MO (MISCELLANEOUS) IMPLANT
NEEDLE MAYO 6 CRC TAPER PT (NEEDLE) IMPLANT
NEEDLE SCORPION MULTI FIRE (NEEDLE) IMPLANT
PACK ARTHROSCOPY SHOULDER (MISCELLANEOUS) ×1 IMPLANT
PAD ABD DERMACEA PRESS 5X9 (GAUZE/BANDAGES/DRESSINGS) ×1 IMPLANT
PAD ARMBOARD POSITIONER FOAM (MISCELLANEOUS) ×2 IMPLANT
PAD COLD SHLDR SM WRAP-ON (PAD) ×1 IMPLANT
PASSER SUT FIRSTPASS SELF (INSTRUMENTS) IMPLANT
SLING ULTRA II M (MISCELLANEOUS) IMPLANT
SOLN STERILE WATER 500 ML (IV SOLUTION) ×1 IMPLANT
SPONGE T-LAP 18X18 ~~LOC~~+RFID (SPONGE) ×1 IMPLANT
SPREADER GRAFT (MISCELLANEOUS) IMPLANT
STRAP SAFETY 5IN WIDE (MISCELLANEOUS) ×1 IMPLANT
SUT ETHILON 3-0 (SUTURE) ×1 IMPLANT
SUT LASSO 90 DEG SD STR (SUTURE) IMPLANT
SUT PROLENE 0 CT 2 (SUTURE) IMPLANT
SUT TICRON 2-0 30IN 311381 (SUTURE) IMPLANT
SUT VIC AB 0 CT1 36 (SUTURE) IMPLANT
SUT VIC AB 2-0 CT2 27 (SUTURE) IMPLANT
SUT XBRAID 1.4 BLK/WHT (SUTURE) IMPLANT
SUTURE 0 FIBERLP 38 BLU TPR ND (SUTURE) IMPLANT
SUTURE FIBERWR #2 38 T-5 BLUE (SUTURE) IMPLANT
SUTURE MNCRL 4-0 27XMF (SUTURE) IMPLANT
SUTURE TAPE 1.3 40 TPR END (SUTURE) IMPLANT
SUTURE TAPE FIBERLINK 1.3 LOOP (SUTURE) IMPLANT
SYR 10ML LL (SYRINGE) IMPLANT
SYSTEM FBRTK BICEPS 1.9 DRILL (Anchor) ×1 IMPLANT
TAPE MICROFOAM 4IN (TAPE) ×1 IMPLANT
TRAP FLUID SMOKE EVACUATOR (MISCELLANEOUS) ×1 IMPLANT
TUBE SET DOUBLEFLO INFLOW (TUBING) ×1 IMPLANT
TUBE SET DOUBLEFLO OUTFLOW (TUBING) ×1 IMPLANT
WAND WEREWOLF FLOW 90D (MISCELLANEOUS) ×1 IMPLANT

## 2024-02-15 NOTE — Op Note (Signed)
 SURGERY DATE: 02/15/2024   PRE-OP DIAGNOSIS:  1. Left subacromial impingement 2. Left proximal biceps rupture 3. Left rotator cuff tear 4. Left acromioclavicular joint arthritis  POST-OP DIAGNOSIS: 1. Left subacromial impingement 2. Left biceps rupture 3. Left rotator cuff tear 4. Left acromioclavicular joint arthritis  PROCEDURES:  1. Left arthroscopic rotator cuff repair (full-thickness subscapularis, supraspinatus, and infraspinatus tears) with allograft augmentation 2. Left arthroscopic subacromial decompression 3. Left arthroscopic extensive debridement of shoulder (glenohumeral and subacromial spaces) 4. Left arthroscopic distal clavicle excision  SURGEON: Earnestine HILARIO Blanch, MD   ASSISTANT: DOROTHA Krystal Doyne, PA   ANESTHESIA: Gen with Exparel  interscalene block   ESTIMATED BLOOD LOSS: 5cc   DRAINS:  none   TOTAL IV FLUIDS: per anesthesia      SPECIMENS: none   IMPLANTS:  - Arthrex 2.65mm PushLock x 1 - Arthrex 4.45mm SwiveLock x 1 - Arthrex Fibertak RC x 1 - Iconix SPEED double loaded with 1.2 and 2.0mm tape x 2 - Stryker Alphavent 4.76mm anchor x 2 - Arthroflex 2mm Dermal Allograft x 1     OPERATIVE FINDINGS:  Examination under anesthesia: A careful examination under anesthesia was performed.  Passive range of motion was: FF: 100; ER at side: 70; ER in abduction: 90; IR in abduction: 40.  Anterior load shift: NT.  Posterior load shift: NT.  Sulcus in neutral: NT.  Sulcus in ER: NT.     Intra-operative findings: A thorough arthroscopic examination of the shoulder was performed.  The findings are: 1. Biceps tendon: Remnant of proximal biceps stump 2. Superior labrum: erythema 3. Posterior labrum and capsule: normal 4. Inferior capsule and inferior recess: normal 5. Glenoid cartilage surface: Normal 6. Supraspinatus attachment: full-thickness tear of the supraspinatus 7. Posterior rotator cuff attachment: Full-thickness tear of the anterior supraspinatus 8. Humeral  head articular cartilage: Scattered grade 1-2 degenerative changes 9. Rotator interval: significant synovitis 10: Subscapularis tendon: Full-thickness tear with mild retraction 11. Anterior labrum: Mildly degenerative 12. IGHL: normal   OPERATIVE REPORT:    Indications for procedure:  Chase Boyd is a 79 y.o. male with approximately 2 months of increasing left shoulder pain after he had a fall.  He has had difficulty with overhead motion since that time with sensations of weakness.  Patient had failed nonoperative management options.  Clinical exam and MRI were suggestive of rotator cuff tear, biceps tendinopathy, acromioclavicular joint arthritis and subacromial impingement. After discussion of risks, benefits, and alternatives to surgery, the patient elected to proceed.    Procedure in detail:   I identified Chase Boyd in the pre-operative holding area.  I marked the operative shoulder with my initials. I reviewed the risks and benefits of the proposed surgical intervention, and the patient wished to proceed.  Anesthesia was then performed with an Exparel  interscalene block.  The patient was transferred to the operative suite and placed in the beach chair position.     Appropriate IV antibiotics were administered prior to incision. The operative upper extremity was then prepped and draped in standard fashion. A time out was performed confirming the correct extremity, correct patient, and correct procedure.    I then created a standard posterior portal with an 11 blade. The glenohumeral joint was easily entered with a blunt trocar and the arthroscope introduced. The findings of diagnostic arthroscopy are described above. I debrided degenerative tissue including the remnant stump of the torn biceps tendon, synovitic tissue about the rotator interval, and anterior and superior labrum. I then coagulated the inflamed  synovium to obtain hemostasis and reduce the risk of post-operative  swelling using an Arthrocare radiofrequency device.   The subscapularis tear was identified.  A superior anterolateral portal was made under needle localization.  A cannula was placed.  The comma tissue indicating the superolateral border of the subscapularis was identified readily.  The tip of the coracoid as well as the conjoined tendon and coracoacromial ligaments were visualized after debriding rotator interval tissue.  Tissue about the subscapularis was released anteriorly, superiorly, and posteriorly to allow for improved mobilization.  The comma tissue was tagged with a FiberWire suture and tension on this allowed for appropriate mobilization of the subscapularis.  The lesser tuberosity footprint was prepared with a combination of electrocautery and burr. 2 suture tapes were passed in a mattress fashion.  The traction stitch was removed.  All 4 strands of suture were then loaded onto a 4.75 mm SwiveLock anchor and placed into the prepared footprint with the arm in a neutral position.  This construct appropriately reduced the subscapularis tear.  The arm was then internally and externally rotated and the subscapularis was noted to move appropriately with rotation.  The remainder of the suture was then cut.   Next, the arthroscope was then introduced into the subacromial space. A direct lateral portal was created with an 11-blade after spinal needle localization. An extensive subacromial bursectomy and debridement was performed using a combination of the shaver and Arthrocare wand. The entire acromial undersurface was exposed and the CA ligament was subperiosteally elevated to expose the anterior acromial hook. A burr was used to create a flat anterior and lateral aspect of the acromion, converting it from a Type 2 to a Type 1 acromion. Care was made to keep the deltoid fascia intact.   I then turned my attention to the arthroscopic distal clavicle excision. I identified the acromioclavicular joint.  Surrounding bursal tissue was debrided and the edges of the joint were identified. I used the 5.88mm barrel burr to remove the distal clavicle parallel to the edge of the acromion. I was able to fit two widths of the burr into the space between the distal clavicle and acromion, signifying that I had removed ~84mm of distal clavicle. This was confirmed by viewing anteriorly and introducing a probe with measuring marks from the lateral portal. Hemostasis was achieved with an Arthrocare wand.   Next, I created an accessory posterolateral portal to assist with visualization and instrumentation.  I debrided the poor quality edges of the supraspinatus tendon.  This was a large, retracted tear of the supraspinatus and anterior infraspinatus with retraction to the level of the glenohumeral joint.  Next, I prepared the footprint using a burr to expose bleeding bone.     I then percutaneously placed one Iconix SPEED medial row anchor along the anterior portion of the tear at the articular margin. Another SPEED anchor was placed along the posterior portion of the tear at the articular margin. A third medial row anchor was placed centrally given the size of the tear. This was an Tax Inspector. I then shuttled all the strands of tape through the rotator cuff just lateral to the musculotendinous junction using a FirstPass suture passer spanning the anterior to posterior extent of the tear. The posterior strands of each suture were passed through a Stryker AlphaVent 4.32mm anchor. This was placed approximately 2 cm distal to the lateral edge of the footprint in line with the posterior aspect of the tear with appropriate tensioning of each  suture prior to final fixation.  Similarly, the anterior strands of each suture were passed through another AlphaVent anchor along the anterior margin of the tear. This achieved excellent coverage of the posterior aspect of the rotator cuff footprint. The anterior aspect of the  footprint was partially uncovered for ~33mm. Given size of tear, amount of retraction, and patient's age, decision was made to augment with allograft. PassPort cannula was placed through the lateral portal. 2mm dermal allograft was prepared and loaded onto the graft spreader, which was then placed overlying the rotator cuff repair. Two Arthrex Fiberstitch implants were utilized for medial fixation. Anterior implant was placed through a separate percutaneous incision. The anterior lateral luggage tag stitch was loaded onto an Arthrex PushLock anchor. This was impacted into place just lateral and anterior to the previous anterior lateral row anchor. The repair suture from the posterior lateral Alphavent was shuttled through the posterior lateral aspect of the graft and a knot was tied arthroscopically to secure the graft posteolaterally. This construct allowed for excellent onlay augmentation of the dermal allograft. The repair was stable to external and internal rotation.   Fluid was evacuated from the shoulder, and the portals were closed with 3-0 Nylon. Xeroform was applied to the portals. A sterile dressing was applied, followed by a Polar Care sleeve and a SlingShot shoulder immobilizer/sling. The patient was awakened from anesthesia without difficulty and was transferred to the PACU in stable condition.    Of note, assistance from a PA was essential to performing the surgery.  PA was present for the entire surgery.  PA assisted with patient positioning, retraction, instrumentation, and wound closure. The surgery would have been more difficult and had longer operative time without PA assistance.   Additionally, this case had increased complexity compared to standard arthroscopic rotator cuff repair given the involvement of the subscapularis tear and allograft augmentation. These factors increased surgical time by over 2 hours and increased complexity due to additional preparation and repair of subscapularis  tear, use of additional implants, and creation of an accessory portals, all of which would have otherwise not occurred.  COMPLICATIONS: none   DISPOSITION: plan for discharge home after recovery in PACU     POSTOPERATIVE PLAN: Remain in sling (except hygiene and elbow/wrist/hand RoM exercises as instructed by PT) x 6-8 weeks and NWB for this time. PT to begin 3-4 days after surgery.   Allograft augmentation/SCR rehab protocol. ASA 325mg  daily x 2 weeks for DVT ppx.

## 2024-02-15 NOTE — Anesthesia Procedure Notes (Signed)
 Procedure Name: Intubation Date/Time: 02/15/2024 10:52 AM  Performed by: Jestine Palma, CRNAPre-anesthesia Checklist: Patient identified, Emergency Drugs available, Suction available and Patient being monitored Patient Re-evaluated:Patient Re-evaluated prior to induction Oxygen Delivery Method: Circle system utilized Preoxygenation: Pre-oxygenation with 100% oxygen Induction Type: IV induction Ventilation: Mask ventilation without difficulty Laryngoscope Size: McGrath and 3 Grade View: Grade I Tube type: Oral Tube size: 7.5 mm Number of attempts: 1 Airway Equipment and Method: Stylet and Oral airway Placement Confirmation: ETT inserted through vocal cords under direct vision, positive ETCO2 and breath sounds checked- equal and bilateral Secured at: 20 cm Tube secured with: Tape Dental Injury: Teeth and Oropharynx as per pre-operative assessment

## 2024-02-15 NOTE — Transfer of Care (Signed)
 Immediate Anesthesia Transfer of Care Note  Patient: Chase Boyd  Procedure(s) Performed: ARTHROSCOPY, SHOULDER WITH REPAIR, ROTATOR CUFF (Left: Shoulder) DECOMPRESSION, SUBACROMIAL SPACE (Left: Shoulder) TENODESIS, BICEPS (Left: Shoulder)  Patient Location: PACU  Anesthesia Type:General  Level of Consciousness: drowsy and patient cooperative  Airway & Oxygen Therapy: Patient Spontanous Breathing and Patient connected to face mask oxygen  Post-op Assessment: Report given to RN and Post -op Vital signs reviewed and stable  Post vital signs: Reviewed and stable  Last Vitals:  Vitals Value Taken Time  BP    Temp    Pulse 102 02/15/24 16:05  Resp 21 02/15/24 16:05  SpO2 100 % 02/15/24 16:05  Vitals shown include unfiled device data.  Last Pain:  Vitals:   02/15/24 1037  TempSrc:   PainSc: 0-No pain         Complications: No notable events documented.

## 2024-02-15 NOTE — Progress Notes (Signed)
 Teaching with patient and wife regarding iceman, regional block, immobilizer.  Instructed to call provider office if any questions/concerns, verbalizes understanding.

## 2024-02-15 NOTE — Discharge Instructions (Signed)
 Post-Op Instructions - Rotator Cuff Repair  1. Bracing: You will wear a shoulder immobilizer or sling for 6-8 weeks.   2. Driving: No driving for 6 weeks post-op.   3. Activity: No active lifting for 2 months. Wrist, hand, and elbow motion only. Avoid lifting the upper arm away from the body except for hygiene. You are permitted to bend and straighten the elbow passively only (no active elbow motion). You may use your hand and wrist for typing, writing, and managing utensils (cutting food). Do not lift more than a coffee cup for 8 weeks.  When sleeping or resting, inclined positions (recliner chair or wedge pillow) and a pillow under the forearm for support may provide better comfort for up to 4 weeks.  Avoid long distance travel for 4 weeks.  Return to normal activities after rotator cuff repair repair normally takes 6 months on average. If rehab goes very well, may be able to do most activities at 4 months, except overhead or contact sports.  4. Physical Therapy: Begins 4-6 weeks after surgery.  5. Medications:  - You will be provided a prescription for narcotic pain medicine. After surgery, take 1-2 narcotic tablets every 4 hours if needed for severe pain.  - A prescription for anti-nausea medication will be provided in case the narcotic medicine causes nausea - take 1 tablet every 6 hours only if nauseated.   - Take tylenol 1000 mg (2 Extra Strength tablets or 3 regular strength) every 8 hours for pain.  May decrease or stop tylenol 5 days after surgery if you are having minimal pain. - Restart home Eliquis the day after surgery. - DO NOT take ANY nonsteroidal anti-inflammatory pain medications (Advil, Motrin, Ibuprofen, Aleve, Naproxen, or Naprosyn). These medicines can inhibit healing of your shoulder repair.    If you are taking prescription medication for anxiety, depression, insomnia, muscle spasm, chronic pain, or for attention deficit disorder, you are advised that you are at a higher  risk of adverse effects with use of narcotics post-op, including narcotic addiction/dependence, depressed breathing, death. If you use non-prescribed substances: alcohol, marijuana, cocaine, heroin, methamphetamines, etc., you are at a higher risk of adverse effects with use of narcotics post-op, including narcotic addiction/dependence, depressed breathing, death. You are advised that taking > 50 morphine milligram equivalents (MME) of narcotic pain medication per day results in twice the risk of overdose or death. For your prescription provided: oxycodone  5 mg - taking more than 6 tablets per day would result in > 50 morphine milligram equivalents (MME) of narcotic pain medication. Be advised that we will prescribe narcotics short-term, for acute post-operative pain only - 3 weeks for major operations such as shoulder repair/reconstruction surgeries.     6. Post-Op Appointment:  Your first post-op appointment will be 10-14 days post-op.  7. Work or School: For most, but not all procedures, we advise staying out of work or school for at least 1 to 2 weeks in order to recover from the stress of surgery and to allow time for healing.   If you need a work or school note this can be provided.   8. Smoking: If you are a smoker, you need to refrain from smoking in the postoperative period. The nicotine in cigarettes will inhibit healing of your shoulder repair and decrease the chance of successful repair. Similarly, nicotine containing products (gum, patches) should be avoided.   Post-operative Brace: Apply and remove the brace you received as you were instructed to at the time of  fitting and as described in detail as the braces instructions for use indicate.  Wear the brace for the period of time prescribed by your physician.  The brace can be cleaned with soap and water and allowed to air dry only.  Should the brace result in increased pain, decreased feeling (numbness/tingling), increased swelling or  an overall worsening of your medical condition, please contact your doctor immediately.  If an emergency situation occurs as a result of wearing the brace after normal business hours, please dial 911 and seek immediate medical attention.  Let your doctor know if you have any further questions about the brace issued to you. Refer to the shoulder sling instructions for use if you have any questions regarding the correct fit of your shoulder sling.  [facility] Customer Care for Troubleshooting: [phone]  Video that illustrates how to properly use a shoulder sling: Instructions for Proper Use of an Orthopaedic Sling Http://bass.com/

## 2024-02-15 NOTE — Anesthesia Preprocedure Evaluation (Addendum)
"                                    Anesthesia Evaluation  Patient identified by MRN, date of birth, ID band Patient awake    Reviewed: Allergy & Precautions, H&P , NPO status , Patient's Chart, lab work & pertinent test results, reviewed documented beta blocker date and time   Airway Mallampati: II  TM Distance: >3 FB Neck ROM: full    Dental  (+) Dental Advidsory Given, Missing, Caps, Teeth Intact   Pulmonary neg pulmonary ROS   Pulmonary exam normal breath sounds clear to auscultation       Cardiovascular Exercise Tolerance: Good hypertension, (-) angina (-) Past MI and (-) Cardiac Stents + dysrhythmias Atrial Fibrillation + Valvular Problems/Murmurs (s/p MV replacement)  Rhythm:regular Rate:Normal     Neuro/Psych  PSYCHIATRIC DISORDERS Anxiety Depression    negative neurological ROS     GI/Hepatic negative GI ROS, Neg liver ROS,,,  Endo/Other  negative endocrine ROS    Renal/GU Renal disease (CKD)  negative genitourinary   Musculoskeletal   Abdominal   Peds  Hematology negative hematology ROS (+)   Anesthesia Other Findings Past Medical History: No date: Anxiety No date: Arthritis No date: Atrial fibrillation (HCC) No date: BPH (benign prostatic hyperplasia) No date: Chronic kidney disease No date: Depression No date: History of elevated prostate specific antigen (PSA) No date: HLD (hyperlipidemia) No date: Hypertension No date: IBS (irritable bowel syndrome) No date: Pulmonary hypertension (HCC) No date: Right ventricular dysfunction   Reproductive/Obstetrics negative OB ROS                              Anesthesia Physical Anesthesia Plan  ASA: 2  Anesthesia Plan: General   Post-op Pain Management: Regional block*   Induction: Intravenous  PONV Risk Score and Plan: 2 and Ondansetron , Dexamethasone and Treatment may vary due to age or medical condition  Airway Management Planned: Oral ETT  Additional  Equipment:   Intra-op Plan:   Post-operative Plan: Extubation in OR  Informed Consent: I have reviewed the patients History and Physical, chart, labs and discussed the procedure including the risks, benefits and alternatives for the proposed anesthesia with the patient or authorized representative who has indicated his/her understanding and acceptance.     Dental Advisory Given  Plan Discussed with: Anesthesiologist, CRNA and Surgeon  Anesthesia Plan Comments:          Anesthesia Quick Evaluation  "

## 2024-02-15 NOTE — Anesthesia Postprocedure Evaluation (Signed)
"   Anesthesia Post Note  Patient: ISMAIL GRAZIANI  Procedure(s) Performed: ARTHROSCOPY, SHOULDER WITH REPAIR, ROTATOR CUFF (Left: Shoulder) DECOMPRESSION, SUBACROMIAL SPACE (Left: Shoulder) TENODESIS, BICEPS (Left: Shoulder)  Patient location during evaluation: PACU Anesthesia Type: General Level of consciousness: awake and alert Pain management: pain level controlled Vital Signs Assessment: post-procedure vital signs reviewed and stable Respiratory status: spontaneous breathing, nonlabored ventilation and respiratory function stable Cardiovascular status: blood pressure returned to baseline and stable Postop Assessment: no apparent nausea or vomiting Anesthetic complications: no   No notable events documented.   Last Vitals:  Vitals:   02/15/24 1630 02/15/24 1653  BP: 96/68 107/78  Pulse: 96 92  Resp: 15 16  Temp: 36.5 C 36.4 C  SpO2: 96% 94%    Last Pain:  Vitals:   02/15/24 1653  TempSrc: Temporal  PainSc: 0-No pain                 Camellia Merilee Louder      "

## 2024-02-15 NOTE — Anesthesia Procedure Notes (Signed)
 Anesthesia Regional Block: Interscalene brachial plexus block   Pre-Anesthetic Checklist: , timeout performed,  Correct Patient, Correct Site, Correct Laterality,  Correct Procedure, Correct Position, site marked,  Risks and benefits discussed,  Surgical consent,  Pre-op evaluation,  At surgeon's request and post-op pain management  Laterality: Left and Upper  Prep: chloraprep       Needles:  Injection technique: Single-shot  Needle Type: Stimiplex     Needle Length: 5cm  Needle Gauge: 22     Additional Needles:   Procedures:,,,, ultrasound used (permanent image in chart),,    Narrative:  Start time: 02/15/2024 10:33 AM End time: 02/15/2024 10:36 AM Injection made incrementally with aspirations every 5 mL.  Performed by: Personally  Anesthesiologist: Dario Barter, MD  Additional Notes: Functioning IV was confirmed and monitors were applied.  A 50mm 22ga Stimuplex needle was used. Sterile prep and drape,hand hygiene and sterile gloves were used.  Negative aspiration and negative test dose prior to incremental administration of local anesthetic. The patient tolerated the procedure well.

## 2024-02-15 NOTE — H&P (Signed)
 Paper H&P to be scanned into permanent record. H&P reviewed. No significant changes noted.

## 2024-02-16 ENCOUNTER — Ambulatory Visit: Admitting: Physical Therapy

## 2024-02-18 ENCOUNTER — Encounter: Payer: Self-pay | Admitting: Orthopedic Surgery

## 2024-02-18 ENCOUNTER — Encounter

## 2024-02-18 ENCOUNTER — Ambulatory Visit

## 2024-02-22 ENCOUNTER — Ambulatory Visit

## 2024-02-22 ENCOUNTER — Encounter

## 2024-02-23 ENCOUNTER — Ambulatory Visit: Admitting: Physical Therapy

## 2024-02-25 ENCOUNTER — Ambulatory Visit: Admitting: Physical Therapy

## 2024-02-29 ENCOUNTER — Ambulatory Visit: Admitting: Physical Therapy

## 2024-03-02 ENCOUNTER — Ambulatory Visit: Admitting: Physical Therapy

## 2024-03-03 ENCOUNTER — Ambulatory Visit: Admitting: Physical Therapy

## 2024-03-07 ENCOUNTER — Ambulatory Visit: Admitting: Physical Therapy

## 2024-03-09 ENCOUNTER — Ambulatory Visit: Admitting: Physical Therapy

## 2024-03-10 ENCOUNTER — Ambulatory Visit: Admitting: Physical Therapy

## 2024-03-14 ENCOUNTER — Ambulatory Visit: Admitting: Physical Therapy

## 2024-03-16 ENCOUNTER — Ambulatory Visit: Admitting: Physical Therapy

## 2024-03-17 ENCOUNTER — Ambulatory Visit: Admitting: Physical Therapy

## 2024-03-21 ENCOUNTER — Ambulatory Visit: Admitting: Physical Therapy

## 2024-03-23 ENCOUNTER — Ambulatory Visit: Admitting: Physical Therapy

## 2024-03-24 ENCOUNTER — Ambulatory Visit: Admitting: Physical Therapy
